# Patient Record
Sex: Female | Born: 1960 | Race: Black or African American | Hispanic: No | Marital: Married | Smoking: Never smoker
Health system: Southern US, Community
[De-identification: ages and names within clinical notes are randomized; demographics above are authoritative.]

## PROBLEM LIST (undated history)

## (undated) DIAGNOSIS — I10 Essential (primary) hypertension: Secondary | ICD-10-CM

## (undated) DIAGNOSIS — E079 Disorder of thyroid, unspecified: Secondary | ICD-10-CM

## (undated) HISTORY — PX: BREAST EXCISIONAL BIOPSY: SUR124

## (undated) HISTORY — PX: ABDOMINAL HYSTERECTOMY: SHX81

## (undated) HISTORY — DX: Essential (primary) hypertension: I10

## (undated) HISTORY — PX: THYROIDECTOMY, PARTIAL: SHX18

---

## 2001-01-14 ENCOUNTER — Emergency Department (HOSPITAL_COMMUNITY): Admission: EM | Admit: 2001-01-14 | Discharge: 2001-01-14 | Payer: Self-pay | Admitting: Internal Medicine

## 2001-01-14 ENCOUNTER — Encounter: Payer: Self-pay | Admitting: Internal Medicine

## 2001-01-14 ENCOUNTER — Ambulatory Visit (HOSPITAL_COMMUNITY): Admission: RE | Admit: 2001-01-14 | Discharge: 2001-01-14 | Payer: Self-pay

## 2001-09-28 ENCOUNTER — Other Ambulatory Visit: Admission: RE | Admit: 2001-09-28 | Discharge: 2001-09-28 | Payer: Self-pay | Admitting: Obstetrics and Gynecology

## 2002-04-05 ENCOUNTER — Ambulatory Visit (HOSPITAL_COMMUNITY): Admission: RE | Admit: 2002-04-05 | Discharge: 2002-04-05 | Payer: Self-pay | Admitting: Obstetrics & Gynecology

## 2003-01-13 ENCOUNTER — Encounter: Payer: Self-pay | Admitting: Preventative Medicine

## 2003-01-13 ENCOUNTER — Ambulatory Visit (HOSPITAL_COMMUNITY): Admission: RE | Admit: 2003-01-13 | Discharge: 2003-01-13 | Payer: Self-pay | Admitting: Preventative Medicine

## 2004-04-29 ENCOUNTER — Ambulatory Visit (HOSPITAL_COMMUNITY): Admission: RE | Admit: 2004-04-29 | Discharge: 2004-04-29 | Payer: Self-pay | Admitting: Obstetrics & Gynecology

## 2004-06-13 ENCOUNTER — Encounter: Admission: RE | Admit: 2004-06-13 | Discharge: 2004-09-11 | Payer: Self-pay | Admitting: Surgery

## 2005-01-13 ENCOUNTER — Encounter: Admission: RE | Admit: 2005-01-13 | Discharge: 2005-04-13 | Payer: Self-pay | Admitting: Surgery

## 2005-01-21 ENCOUNTER — Ambulatory Visit: Admission: RE | Admit: 2005-01-21 | Discharge: 2005-01-21 | Payer: Self-pay | Admitting: Surgery

## 2005-01-23 ENCOUNTER — Encounter: Admission: RE | Admit: 2005-01-23 | Discharge: 2005-01-23 | Payer: Self-pay | Admitting: *Deleted

## 2005-05-05 ENCOUNTER — Ambulatory Visit (HOSPITAL_COMMUNITY): Admission: RE | Admit: 2005-05-05 | Discharge: 2005-05-05 | Payer: Self-pay | Admitting: Obstetrics and Gynecology

## 2005-06-11 ENCOUNTER — Other Ambulatory Visit: Admission: RE | Admit: 2005-06-11 | Discharge: 2005-06-11 | Payer: Self-pay | Admitting: Obstetrics and Gynecology

## 2005-06-25 ENCOUNTER — Ambulatory Visit (HOSPITAL_COMMUNITY): Admission: RE | Admit: 2005-06-25 | Discharge: 2005-06-25 | Payer: Self-pay | Admitting: Obstetrics & Gynecology

## 2005-07-14 ENCOUNTER — Encounter: Payer: Self-pay | Admitting: Obstetrics and Gynecology

## 2005-07-14 ENCOUNTER — Observation Stay (HOSPITAL_COMMUNITY): Admission: RE | Admit: 2005-07-14 | Discharge: 2005-07-14 | Payer: Self-pay | Admitting: Obstetrics and Gynecology

## 2005-07-22 ENCOUNTER — Emergency Department (HOSPITAL_COMMUNITY): Admission: EM | Admit: 2005-07-22 | Discharge: 2005-07-22 | Payer: Self-pay | Admitting: Emergency Medicine

## 2005-07-24 ENCOUNTER — Inpatient Hospital Stay (HOSPITAL_COMMUNITY): Admission: RE | Admit: 2005-07-24 | Discharge: 2005-07-26 | Payer: Self-pay | Admitting: Obstetrics and Gynecology

## 2005-07-24 ENCOUNTER — Encounter: Payer: Self-pay | Admitting: Obstetrics and Gynecology

## 2005-08-22 ENCOUNTER — Ambulatory Visit (HOSPITAL_COMMUNITY): Admission: RE | Admit: 2005-08-22 | Discharge: 2005-08-22 | Payer: Self-pay | Admitting: Obstetrics and Gynecology

## 2006-01-02 ENCOUNTER — Ambulatory Visit: Payer: Self-pay | Admitting: Internal Medicine

## 2008-03-08 ENCOUNTER — Ambulatory Visit (HOSPITAL_COMMUNITY): Admission: RE | Admit: 2008-03-08 | Discharge: 2008-03-08 | Payer: Self-pay | Admitting: *Deleted

## 2009-03-14 ENCOUNTER — Ambulatory Visit (HOSPITAL_COMMUNITY): Admission: RE | Admit: 2009-03-14 | Discharge: 2009-03-14 | Payer: Self-pay | Admitting: *Deleted

## 2009-03-21 ENCOUNTER — Encounter (INDEPENDENT_AMBULATORY_CARE_PROVIDER_SITE_OTHER): Payer: Self-pay | Admitting: *Deleted

## 2009-03-27 DIAGNOSIS — R12 Heartburn: Secondary | ICD-10-CM | POA: Insufficient documentation

## 2009-03-27 DIAGNOSIS — A048 Other specified bacterial intestinal infections: Secondary | ICD-10-CM | POA: Insufficient documentation

## 2009-03-27 DIAGNOSIS — K219 Gastro-esophageal reflux disease without esophagitis: Secondary | ICD-10-CM | POA: Insufficient documentation

## 2009-03-28 ENCOUNTER — Ambulatory Visit: Payer: Self-pay | Admitting: Gastroenterology

## 2009-03-28 DIAGNOSIS — K3189 Other diseases of stomach and duodenum: Secondary | ICD-10-CM | POA: Insufficient documentation

## 2009-03-28 DIAGNOSIS — R10819 Abdominal tenderness, unspecified site: Secondary | ICD-10-CM | POA: Insufficient documentation

## 2009-03-28 DIAGNOSIS — R1013 Epigastric pain: Secondary | ICD-10-CM

## 2009-04-05 ENCOUNTER — Telehealth (INDEPENDENT_AMBULATORY_CARE_PROVIDER_SITE_OTHER): Payer: Self-pay

## 2009-04-06 ENCOUNTER — Encounter: Payer: Self-pay | Admitting: Gastroenterology

## 2009-04-06 ENCOUNTER — Ambulatory Visit (HOSPITAL_COMMUNITY): Admission: RE | Admit: 2009-04-06 | Discharge: 2009-04-06 | Payer: Self-pay | Admitting: Gastroenterology

## 2009-04-06 ENCOUNTER — Ambulatory Visit: Payer: Self-pay | Admitting: Gastroenterology

## 2009-04-06 ENCOUNTER — Telehealth: Payer: Self-pay | Admitting: Gastroenterology

## 2009-04-12 ENCOUNTER — Encounter (INDEPENDENT_AMBULATORY_CARE_PROVIDER_SITE_OTHER): Payer: Self-pay | Admitting: *Deleted

## 2009-09-11 ENCOUNTER — Encounter (INDEPENDENT_AMBULATORY_CARE_PROVIDER_SITE_OTHER): Payer: Self-pay | Admitting: *Deleted

## 2010-04-15 ENCOUNTER — Ambulatory Visit (HOSPITAL_COMMUNITY): Admission: RE | Admit: 2010-04-15 | Discharge: 2010-04-15 | Payer: Self-pay | Admitting: *Deleted

## 2010-05-03 ENCOUNTER — Encounter: Admission: RE | Admit: 2010-05-03 | Discharge: 2010-05-03 | Payer: Self-pay | Admitting: Family Medicine

## 2011-01-28 NOTE — Op Note (Signed)
NAMECACHET, MCCUTCHEN          ACCOUNT NO.:  [id number]   MEDICAL RECORD NO.:  [id number]          PATIENT TYPE:  AMB   LOCATION:  DAY                           FACILITY:  [facility]   PHYSICIAN:  Kassie Mends, M.D.      DATE OF BIRTH:  May 31, 1961   DATE OF PROCEDURE:  04/06/2009  DATE OF DISCHARGE:                                PROCEDURE NOTE   REFERRING Temika Sutphin:  Dr. Samuel Jester   PROCEDURE:  Esophagogastroduodenoscopy with cold forceps biopsy.   INDICATION FOR EXAM:  Ms. Mowers is a 50 year old female who  complains of abdominal pain.  She has a history of H. pylori serology  positivity, which is treated with antibiotics.  She continues to have  dyspepsia.  The colonoscopy was not performed today because the patient  could not tolerate the polyethylene glycol prep due to vomiting.   FINDINGS:  1. Normal esophagus without evidence of Barrett, mass, erosion,      ulceration or stricture.  2. Patchy erythema in the antrum without erosion or ulceration.      Biopsies obtained via cold forceps to evaluate for H. pylori      gastritis, eosinophilic gastritis.  3. Normal duodenal bulb and second portion of the duodenum.   RECOMMENDATIONS:  1. She should continue the Prevacid.  2. Hold aspirin, NSAIDs and anticoagulation for 7 days.  3. Low-fat diet.  She is given a handout on low-fat diet and the [facility] recommendations as well as gastritis.  4. Will call her with the results of her biopsies.  5. She will be scheduled for a colonoscopy with OsmoPrep in August      2010.  She is given the medication warning regarding phosphate      nephropathy.   MEDICATIONS:  1. Demerol 100 mg IV.  2. Versed 6 mg IV.   PROCEDURE TECHNIQUE:  Physical exam was performed.  Informed consent was  obtained from the patient after explaining the benefits, risks and  alternatives to the procedure.  The patient was connected to the monitor  and placed in left lateral position.   Continuous oxygen was provided by  nasal cannula and IV medicine administered through an indwelling  cannula.  After administration of sedation, the patient's esophagus was  intubated and the scope was advanced under direct visualization to the  second portion of the duodenum.  Scope was removed slowly by carefully  examining the color, texture, anatomy and integrity of the mucosa on the  way out.  The patient was recovered in endoscopy and discharged home in  satisfactory condition.   PATH:  No H. pylori.      Kassie Mends, M.D.  Electronically Signed     SM/MEDQ  D:  04/06/2009  T:  04/06/2009  Job:  [id number]   cc:   Samuel Jester  Fax: [phone]

## 2011-01-31 NOTE — Discharge Summary (Signed)
Bonnie Martin, Bonnie Martin          ACCOUNT NO.:  [id number]   MEDICAL RECORD NO.:  [id number]          PATIENT TYPE:  INP   LOCATION:  A428                          FACILITY:  [facility]   PHYSICIAN:  Tilda Burrow, M.D. DATE OF BIRTH:  1961/07/11   DATE OF ADMISSION:  DATE OF DISCHARGE:  LH                                 DISCHARGE SUMMARY   ADMISSION DIAGNOSES:  1.  Debilitating pelvic pain.  2.  Hematometra.  3.  Hydrosalpinx.   DISCHARGE DIAGNOSES:  1.  Pelvic pain.  2.  Hematometra.  3.  Bilateral hydrosalpinx.  4.  Obesity.   PROCEDURES:  Total abdominal hysterectomy and bilateral salpingo-  oophorectomy, panniculectomy.  [name] performed July 24, 2005.   DISCHARGE MEDICATIONS:  1.  Dilaudid 2 mg p.o. q.4 h p.r.n. pain.  2.  Phenergan 25 mg p.o. q.4 h p.r.n. nausea.  3.  Levaquin 500 mg p.o. daily x14 days.  4.  Levothyroxine 0.112 mg p.o. daily.  5.  Prevacid 30 mg p.o. daily.  6.  Toprol XL 100 mg p.o. daily.  7.  Simvastatin 10 mg q.h.s.  8.  K-Chlor 10 mEq p.o. b.i.d.  9.  Hyzera 100 mg p.o. daily.  10. Multivitamins over-the-counter once daily.  11. HCTZ 25 mg p.o. daily.   HOSPITAL SUMMARY:  This 50 year old female, status post endometrial ablation  was admitted for hysterectomy, possible bilateral salpingo-oophorectomy and  panniculectomy.  She was admitted on July 24, 2005.  She underwent  anticipate TAH with bilateral salpingo-oophorectomy and panniculectomy as  performed in the operative note.  The uterus showed bilateral hydrosalpinx  with communications in the proximal portion of the tube with the old blood  filled uterus.  There was evidence of uterine perforation during recent  efforts at hysteroscopy.  The patient's hysterectomy showed a 245 g uterus,  a 1900 g panniculus.  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  The  patient had 1500 cc blood loss and  required transfusion of two units of packed cells over the course of the  surgical procedure.   Postoperatively, the patient had surprisingly straightforward course.  Tolerating regular diet on day #2 and was discharged home on postop day #2  on Dilaudid, Phenergan and Levaquin.  For follow up in 5 days in our office  for staple removal and drain evaluation.  Postoperative hemoglobin was 12.2,  hematocrit 35.0 compared to 13.4 and 38.7 on admission.  Liver function  tests were normal.  Maternal blood type was 0 positive.      Tilda Burrow, M.D.  Electronically Signed     JVF/MEDQ  D:  08/11/2005  T:  08/12/2005  Job:  [id number]   cc:   Samuel Jester  Fax: [phone]

## 2011-01-31 NOTE — Op Note (Signed)
Bonnie Martin, Bonnie Martin          ACCOUNT NO.:  [id number]   MEDICAL RECORD NO.:  [id number]          PATIENT TYPE:  OBV   LOCATION:  A417                          FACILITY:  [facility]   PHYSICIAN:  Tilda Burrow, M.D. DATE OF BIRTH:  February 21, 1961   DATE OF PROCEDURE:  07/14/2005  DATE OF DISCHARGE:                                 OPERATIVE REPORT   PREOPERATIVE DIAGNOSIS:  Pelvic pain, hematometria.   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   OPERATION PERFORMED:  Hysteroscopy, discontinued dilation and curettage.   SURGEON:  Tilda Burrow, M.D.   ASSISTANT:  None.   ANESTHESIA:  Spinal.  Nelda Severe CRNA.   COMPLICATIONS:  Technically challenging procedure, notable for possible  uterine perforation during placement of the curette, resulting in  discontinuation of the dilation and curettage.   DESCRIPTION OF PROCEDURE:  The patient was taken to the operating room,  spinal anesthesia introduced and the patient placed in supine position.  Cervix was identified and showed descensus to within approximately 3 cm of  the introitus.  The anterior lip of the cervix was significantly longer than  the posterior lip.  The uterus was sounded to a depth of 8 cm from the  posterior lip, 10 cm from the anterior lip, found to be dilated just  slightly to the right of the midline.  This was carefully achieved and had  the sensation of a normal sounding.  There was an expulsion of a bit of dark  blood with the sounding process indicating that some endometrial cavity  debris was released.  We then dilated the lower uterine segment serially to  68 [place] with normal sensation.  At this point, the rigid hysteroscope, 12  degree angulation was then used to attempt to sound the canal and enter the  uterine cavity.  There was initially some resistance and efforts at gently  teasing and rotating back and forth with the scope in  order to enter the  uterine cavity were unsuccessful. We took a photograph of the lower uterine  segment through the hysteroscope.  Once we got up into the lower uterine  segment, the area immediately in front of the camera showed thin irregular  areas of suspected uterine synechiae, all thin and filmy.  I felt that this  perhaps represented postablation adhesions.   Of particular note is that the patient had significant amount of bleeding  over the weekend.  There was not a lot of blood encountered in the uterine  cavity other than some fresh blood from the uterine fundus. We were able to  observe the area but could not identify the tubal ostia.  The thin filmy  adhesions precluded a thorough evaluation of the endometrial cavity.  We  never were able to get the hysteroscope all the way to the fundus due to  some bright red fresh bleeding.   At this point, we felt there had been no evidence to suggest perforation  though the bright red oozing from the upper aspect of the uterus above the  filmy adhesions raised questions.  A smooth sharp  curette was then  introduced gently but met less resistance than expected, and after having a  brief initial sensation of contacting the uterine fundus at the appropriate  depth of approximately 8 cm, the curette passed 2 to 3 cm past that,  suggesting possibility of uterine perforation or false channel production.  The curette was gently eased out.  There was no actual curettage performed.  We sent the small amount of tissue on the curetted edge as a sample.  We  then attempted to repeat hysteroscopy.  At this time the hysteroscope  reintroduced into the level of the thin filmy adhesions.  We could not  really look past them to identify if there was any distinct evaluation of  perforation.  We discontinued the curettage and at this point monitored her  for little greater than usual oozing per vagina.  We will observe the  patient overnight to ensure  that there is no evidence of perforation  complications.  At the time of the recovery room, the patient had minimal  bleeding and was completely comfortable.  We will monitor her at least until  5 o'clock, perhaps overnight.  CBC is ordered at 4 p.m.      Tilda Burrow, M.D.  Electronically Signed     JVF/MEDQ  D:  07/14/2005  T:  07/14/2005  Job:  [id number]

## 2011-01-31 NOTE — H&P (Signed)
NAME:  Bonnie Martin, Bonnie Martin          ACCOUNT NO.:  [id number]   MEDICAL RECORD NO.:  [id number]          PATIENT TYPE:  AMB   LOCATION:  DAY                           FACILITY:  [facility]   PHYSICIAN:  Tilda Burrow, M.D. DATE OF BIRTH:  1961-08-27   DATE OF ADMISSION:  DATE OF DISCHARGE:  LH                                HISTORY & PHYSICAL   ADMISSION DIAGNOSES:  Progressively debilitating pelvic pain secondary to  hematometra, failed hysteroscopy and left hydrosalpinx, admitted for total  abdominal hysterectomy, left salpingectomy, possible bilateral  salpingectomy, and panniculectomy.   HISTORY OF PRESENT ILLNESS:  This 50 year old female is admitted at this  time for hysterectomy after chronic and progressive pelvic pain associated  with hematometra.  Bonnie Martin had a hysteroscopy D&C and endometrial ablation 3  years ago for progressive heavy menses.  She did well for a while and over  past few months developed increasing discomfort in the pelvis.  She had  significant left lower quadrant pain evaluated by ultrasound in early  October which showed a retroverted uterus with hematometra.  She had a left  hydrosalpinx with fluid noted there as well.  Hematometra measured 2.3 cm in  diameter at the time.  The hydrosalpinx did not give distinct measurements  initially.  She has had no fever.  GC and chlamydia cultures were negative.  She had attempts at hysteroscopic drainage of the hematometra performed by  [name] on July 14, 2005.  This was discontinued after we could  not be certain if there was a uterine perforation associated with initial  placement of curet.  She was observed overnight to ensure stability and was  indeed without any change in status overnight.  The limited amount of tissue  obtained showed only endocervical mucosa with no evidence of endometrial  tissue.  She was then sent home for followup in our office.  She has had  increasing progressive pain without  fever or chills.  She has been seen in  the emergency room at [facility] with normal white count, unchanged  hematocrit, and an ultrasound which shows increased size to the fluid pocket  and the hydrosalpinx still present.  She was seen back in our office the  following morning for discussion of options, and we decided to proceed with  hysterectomy.  Pros and cons of surgery have been discussed with her,  emphasizing that efforts at drainage of the uterus would not likely result  in long-term cure.  The pain was significantly debilitating that she was  scheduled for surgery originally for July 23, 2005, and rescheduled to  July 24, 2005, at patient request.  Unfortunately yesterday, July 22, 2005, she was seen in the emergency room due to increasing pain, not  resolved with Percocet, and so she was given Dilaudid and Phenergan IM.  She  was afebrile and simply experiencing increasing discomfort.  She at this  point has had bowel prep and is scheduled for surgery.   Interestingly, her blood pressure has been less than optimally controlled on  her last two assessments.  She had blood pressure 172/100 when seen  in the  office and similarly elevated in the emergency room but improved with  intramuscular narcotics for pain.   PAST MEDICAL HISTORY:  Positive for myocardial infarction years ago managed  by Dr. Shelva Majestic and Dr. Samuel Jester.  She has history of menorrhagia.  She  has hyperthyroidism due to Graves disease, treated with partial  thyroidectomy in 2005 and which was currently treated with Synthroid.   PAST SURGICAL HISTORY:  1.  Partial thyroidectomy in 2005.  2.  _Endometrial Ablation_ 2003.  3.  Tubal ligation 1993 with second child.   ALLERGIES:  SULF DRUGS.   PHYSICAL EXAMINATION:  GENERAL:  Markedly obese African-American female,  alert and oriented x3.  Weight 279.  Blood pressure 170/100 at last check,  as low as 132/82 at recent office visits.  HEENT:  Normocephalic and atraumatic.  NECK:  Supple. Trachea midline.  CHEST: Clear to auscultation.  ABDOMEN:  Abdominal thickness.  Well-healed C section scar obscured beneath  a large jelly roll panniculus.  We discussed access to the pelvis, and plan  to do a partial panniculectomy to improve access and post surgical mobility  and flexion capability. The patient understands enlarged incision as  accompanying risks associated with this and is an uncovered service by  insurance.  The area of planned surgical incision has been sketched out on  the patient during this discussion for her evaluation and consideration.  PELVIC:  External genitalia normal.  Vaginal exam shows normal secretions.  Cervix closed, nonpurulent.  Recent GC and chlamydia cultures were negative.  Pap smears are up to date and normal.  Adnexa are tender with retroverted  uterus deviated to the left.  She complains of light ache in the inner thigh  which does not seem related to back or leg but to pelvic pressure symptoms.   Ultrasound shows enlarging hematometra and left hydrosalpinx.   PLAN:  Total abdominal hysterectomy, left salpingectomy, possible bilateral  salpingo-oophorectomy with panniculectomy on July 24, 2005.      Tilda Burrow, M.D.  Electronically Signed     JVF/MEDQ  D:  07/23/2005  T:  07/23/2005  Job:  [id number]   cc:   Samuel Jester, M.D.   Macarthur Critchley Shelva Majestic, M.D.  Fax: [phone]

## 2011-01-31 NOTE — Op Note (Signed)
Bonnie Martin, Bonnie Martin          ACCOUNT NO.:  [id number]   MEDICAL RECORD NO.:  [id number]          PATIENT TYPE:  AMB   LOCATION:  DAY                           FACILITY:  [facility]   PHYSICIAN:  Tilda Burrow, M.D. DATE OF BIRTH:  [birth year]   DATE OF PROCEDURE:  07/24/2005  DATE OF DISCHARGE:                                 OPERATIVE REPORT   PREOPERATIVE DIAGNOSES:  1.  Hematometra.  2.  Left hydrosalpinx.  3.  Pelvic pain.  4.  Obesity with panniculus.   POSTOPERATIVE DIAGNOSES:  1.  Pyometra versus hematometria  2.  Bilateral hydrosalpinx.  3.  Pelvic adhesions.  4.  Obesity.   PROCEDURE:  Total abdominal hysterectomy, bilateral salpingo-oophorectomy,  panniculectomy.   SURGEON:  Tilda Burrow, M.D.   ASSISTANT:  ____________, C.S.T.   ANESTHESIA:  General.   COMPLICATIONS:  Diffuse oozing from uterus, extensive adhesions in the  abdomen and pelvis both to the anterior abdominal wall from the prior  Cesarean section and to the pelvis, possibly long standing with additional  adhesions suspected after the uterine perforation the other day.   DETAILS OF PROCEDURE:  The patient was taken to the operating room, prepped  and draped for abdominal and vaginal procedure. The previously marked  panniculectomy, measuring approximately 80 cm in total length, was excised  with maximum width of approximately 12 cm. The underlying fatty tissue was  taken down, being careful to leave a layer of fat over the underlying  fascia. This was quite challenging with extensive vascular supply requiring  multiple ligatures around arterials. We will careful with hemostasis  throughout the case. The lateral one third on each side was closed  initially, leaving only the part between the anterior superior iliac crest  on each side to the midline open. We proceeded with the hysterectomy. The  hysterectomy consisted of a midline vertical Pelosi type incision with  careful entry to the  peritoneal cavity, elevation of the peritoneum and  opening from symphysis pubis to just below the umbilicus. There was  extensive adhesions to the anterior abdominal wall from the omentum which  was densely adherent with apparent long-standing adhesions. This was  dissected off with some difficulty. There were tips of omental adhesions  that went all the day down to the anterior surface of the uterus. They also  went down to the top of the left hydrosalpinx which was suspected due to its  dark character to represent pyosalpinx. The pressure in it was suggestive of  pyosalpinx. We were able to eventually free up the omental adhesions  sufficiently to pack bowel away and begin the hysterectomy. Hysterectomy  consisted of grasping the uterus with a Lahey thyroid tenaculum, taking down  the round ligaments on both sides. I inspected the adnexa, and after it was  apparent that there was extensive adhesions particularly on the left but  involving both sides, we felt that efforts of salvage of ovaries would only  result in need for recurrent surgery. We therefore made the clinical  decision as guided by the patient's preoperative instructions to proceed  with removal of both tubes and  ovaries. First, the right infundibulopelvic  ligament was isolated. The ureter palpable isolated out of the surgical  field, the IP ligament then clamped, cut, and suture ligated. This was  doubly ligated. The broad ligament was skeletonized down toward the uterine  vessels. Bladder flap was developed anteriorly. The left side was treated  similarly. We then cross clamped the uterine vessels with two Heaney clamps,  and back bleeding was controlled with Kelly clamp. We doubly ligated the  uterine vessels. We then marched down the upper and lower cardinal  ligaments, serially clamping, cutting, and suture ligating. There was pretty  generous oozing as we did dissection. This became particularly apparent when  we  started to amputate the cervix off of the vaginal cuff. Very thickened  cuff was amputated off with some technically difficulty, and huge amount of  vascularity encountered, particularly behind the uterine vessels on either  side. This required several figure-of-eight sutures, being careful to place  first an Aldrich stitch at the lateral vaginal angle, then two figure-of-  eight stitches over it even before we were able to completely amputate the  cervix off the cuff. The amputation was then completed. The rest of the cuff  closed with interrupted sutures with a second layer of running locking 0  chromic necessary to improve hemostasis. Pelvic support appeared good.  Pelvis could be irrigated at this point, inspected, found adequately  hemostatic. Peritoneum was only loosely reapproximated in the bladder flap  area. The lateral areas were not reapproximated. Ureters were reconfirmed as  having been unaffected by the surgery visually. We then proceeded to  irrigate the abdomen, removed all laparotomy equipment, inspected and found  no evidence of abdominal injury or contamination. The anterior peritoneum  was closed with 2-0 chromic, the fascia closed in a running 0 Prolene  fashion, then the subcu tissues continued in the reapproximation in the  midline. We reapproximated the subcu tissues with 2-0 plane interrupted  sutures resulting in good skin edge approximation. Two flat JP drains were  placed in the lateral aspects of the incision and allowed to exit through  stab wounds inferior to the incision in the mons pubis area. These were  placed to suction low pressure drainage. The skin edges were then  approximated using staple closure, two staples worth of staple closure were  necessary. As stated earlier, the lateral half lateral half had been stapled  closed during the initial portions of the procedure. Estimated blood loss was 1,400 cc due to the huge amount of vascularity encountered  both in the  subcu fatty tissues and particularly in the pelvis on the back edge of the  vagina. The patient tolerated the procedure well, received 2 units of packed  cells, went to the recovery room with blood pressures in the 140s/90s  initially. She will be treated with Apresoline as necessary for the initial  acute blood pressure management.      Tilda Burrow, M.D.  Electronically Signed     JVF/MEDQ  D:  07/24/2005  T:  07/24/2005  Job:  [id number]   cc:   Samuel Jester  Fax: [phone]   Macarthur Critchley. Shelva Majestic, M.D.  Fax: [phone]

## 2011-01-31 NOTE — Op Note (Signed)
[facility]  Patient:    Bonnie Martin, Bonnie Martin Visit Number: [id number] MRN: [id number]          Service Type: DSU Location: DAY Attending Physician:  Lazaro Arms Dictated by:   Duane Lope, M.D. Proc. Date: 04/05/02 Admit Date:  04/05/2002 Discharge Date: 04/05/2002                             Operative Report  PREOPERATIVE DIAGNOSES: 1. Dysfunctional bleeding. 2. Anemia, unresponsive to conservative measures.  POSTOPERATIVE DIAGNOSES: 1. Dysfunctional bleeding.2 2. Anemia, unresponsive to conservative measures. 3. Large amount of endometrial tissue.  OPERATION: Diagnostic hysteroscopy, uterine curettage, and endometrial ablation using ThermaChoice catheter.  SURGEON:  Duane Lope, M.D.  ANESTHESIA:  General endotracheal.  FINDINGS:  The patient had basically normal uterus and she had basically normal looking endometrial tissue, it was just a lot of it.  There were no polyps or fibroids seen.  DESCRIPTION OF PROCEDURE:  The patient was taken to the operating room, placed in the supine position and underwent general endotracheal anesthesia.  She was then placed in the dorsal lithotomy position, prepped and draped in the usual sterile fashion.  Bladder was drained.  Speculum was placed. Cervix was grasped.  The uterus sounded to 9 cm.  The cervix was dilated to allow passage of a 5 mm hysteroscope and the endometrial cavity was viewed and found to be normal, just a lot of endometrial tissue, no polyps, no fibroids, and the tissue was not concerning for carcinoma although I could believe it could possibly be a endometrial hyperplasia.  The cervix was then dilated and a vigorous sharp curettage was performed and got good uterine cry in all areas. We then reviewed it with the hysteroscope and it effectively gave [place] shaggy thin endometrium at that point.  ThermaChoice endometrial ablation catheter was then used.  It was taken to a pressure of 175  mmHg and remained stable. It required 16 cc of D5W, a 1 minute 16 second warm up and then 8 minutes of ablation was performed at 87 degrees Celsius or 188 degrees Farenheit.  All 16 cc were removed from the catheter.  It was found to be intact.  The patient tolerated the procedure well.  She experienced minimal blood loss and was taken to the recovery room in good and stable condition.  All counts were correct.    ESTIMATED BLOOD LOSS:  INDICATIONS:  DESCRIPTION OF PROCEDURE: Dictated by:   Duane Lope, M.D. Attending Physician:  Lazaro Arms DD:  04/05/02 TD:  04/09/02 Job: [id number]

## 2011-01-31 NOTE — Consult Note (Signed)
[name], [name]          ACCOUNT NO.:  [id number]   MEDICAL RECORD NO.:  [id number]          PATIENT TYPE:  AMB   LOCATION:                                FACILITY:  [facility]   PHYSICIAN:  Lionel December, M.D.    DATE OF BIRTH:  [birth year]   DATE OF CONSULTATION:  01/02/2006  DATE OF DISCHARGE:                                   CONSULTATION   REASON FOR CONSULTATION:  Gastroesophageal reflux disease, history of H.  pylori, needs EGD.   REFERRING PHYSICIAN:  Samuel Jester, M.D.   HISTORY OF PRESENT ILLNESS:  Bonnie Martin is a 50 year old African-American female  patient of Dr. Samuel Jester who presents for further evaluation of above-  stated symptoms.  She says she has had heartburn symptoms off and on for the  past one year.  She also complains of postprandial epigastric burning type  pain.  She has a sensation of a lump and fizzing in her throat when she  wakes up in the mornings.  She has been off and on Prevacid for the past  year.  She does not take it for an extensive period of time, however.  She  says she has had at least three H. pylori breath tests with the third one  being negative.  She has been treated for H. pylori at least 2-3 times.  She  has never had an EGD.  Bowel movements are regular.  No melena or rectal  bleeding.  She has early a.m. nausea.  She denies any dysphagia or  odynophagia.   CURRENT MEDICATIONS:  1.  Toprol XL 100 mg daily.  2.  Simvastatin 40 mg daily.  3.  Potassium chloride 20 mEq daily.  4.  Hydrochlorothiazide 25 mg daily.  5.  Hyzaar 100/25 mg daily.  6.  Levothyroxine 112 mcg daily.  7.  Multivitamin daily.  8.  Aspirin 325 mg daily.  9.  Tums extra strength daily.  10. Biotin 25 mg daily.   ALLERGIES:  SULFA.   PAST MEDICAL HISTORY:  1.  Hypertension.  2.  History of MI at age 35, status post catheterization without angioplasty      or stenting.  3.  Gastroesophageal reflux disease.  4.  Chronic low back pain.  5.   Hypercholesterolemia.  6.  Hypothyroidism.   PAST SURGICAL HISTORY:  1.  D&C.  2.  Cesarean section.  3.  Hysterectomy in November of 2006 also with panniculectomy complicated by      wound infections.   FAMILY HISTORY:  Father had prostate cancer.  No family history of  colorectal cancer.   SOCIAL HISTORY:  She is married with two children.  She is a IT trainer, Water engineer.  She has never been a smoker, no  alcohol use.   REVIEW OF SYSTEMS:  See HPI for GI.  CONSTITUTIONAL:  No weight loss.  CARDIOPULMONARY:  No chest pain or shortness of breath.   PHYSICAL EXAMINATION:  VITAL SIGNS:  Weight 279.5, height 5 feet  4 inches,  temperature 97.8, blood pressure 132/80, pulse 60.  GENERAL:  Pleasant, morbidly obese,  black female in no acute distress.  SKIN:  Warm and dry, no jaundice.  HEENT:  Conjunctivae are  pink, sclerae nonicteric.  Oropharyngeal mucosa  moist and pink.  No lesions, erythema, or exudate.  NECK:  No lymphadenopathy or thyromegaly.  CHEST:  Lungs are clear to auscultation.  CARDIAC:  Exam reveals regular rate and rhythm.  Normal S1/S2.  No murmurs,  rubs, or gallops.  ABDOMEN:  Positive bowel sounds.  Obese but symmetrical.  Soft.  She has  minimal tenderness along her lower abdominal incision.  No rebound  tenderness or guarding.  Mild epigastric tenderness to deep palpation.  No  organomegaly or masses.  EXTREMITIES:  No edema.   IMPRESSION:  Bonnie Martin is a 50 year old lady with intermittent gastroesophageal  reflux symptoms, postprandial epigastric burning type pain over the last one  year.  Symptoms seem to respond to Prevacid; however, she does not stay on  the medication more than a few weeks at a time.  She gives a history of  being treated multiple times for H. pylori, and she believes her last H.  pylori breath test was negative, but I do not have records of this.   PLAN:  1.  Agree with upper endoscopy for further evaluation  of epigastric pain and      gastroesophageal reflux disease symptoms.  She will hold her aspirin for      four days prior to procedure.  Will have her resume Prevacid 30 mg      daily, #20, Solu-tabs provided.  2.  Antireflux measures.  3.  Further recommendations to follow.      Tana Coast, P.A.      Lionel December, M.D.  Electronically Signed    LL/MEDQ  D:  01/02/2006  T:  01/04/2006  Job:  [id number]   cc:   Samuel Jester  Fax: [phone]

## 2011-06-17 ENCOUNTER — Other Ambulatory Visit (HOSPITAL_COMMUNITY): Payer: Self-pay | Admitting: *Deleted

## 2011-06-17 DIAGNOSIS — Z139 Encounter for screening, unspecified: Secondary | ICD-10-CM

## 2011-06-19 ENCOUNTER — Ambulatory Visit (HOSPITAL_COMMUNITY)
Admission: RE | Admit: 2011-06-19 | Discharge: 2011-06-19 | Disposition: A | Discharge status: Home / Self Care | Payer: BC Managed Care – PPO | Source: Ambulatory Visit | Attending: *Deleted | Admitting: *Deleted

## 2011-06-19 ENCOUNTER — Ambulatory Visit (HOSPITAL_COMMUNITY): Payer: Self-pay

## 2011-06-19 DIAGNOSIS — Z139 Encounter for screening, unspecified: Secondary | ICD-10-CM

## 2011-06-19 DIAGNOSIS — Z1231 Encounter for screening mammogram for malignant neoplasm of breast: Secondary | ICD-10-CM | POA: Insufficient documentation

## 2013-03-24 ENCOUNTER — Other Ambulatory Visit (HOSPITAL_COMMUNITY): Payer: Self-pay | Admitting: *Deleted

## 2013-03-24 DIAGNOSIS — Z139 Encounter for screening, unspecified: Secondary | ICD-10-CM

## 2013-03-25 ENCOUNTER — Ambulatory Visit (HOSPITAL_COMMUNITY)
Admission: RE | Admit: 2013-03-25 | Discharge: 2013-03-25 | Disposition: A | Discharge status: Home / Self Care | Payer: BC Managed Care – PPO | Source: Ambulatory Visit | Attending: *Deleted | Admitting: *Deleted

## 2013-03-25 DIAGNOSIS — Z139 Encounter for screening, unspecified: Secondary | ICD-10-CM

## 2013-03-25 DIAGNOSIS — Z1231 Encounter for screening mammogram for malignant neoplasm of breast: Secondary | ICD-10-CM | POA: Insufficient documentation

## 2014-01-31 ENCOUNTER — Ambulatory Visit (INDEPENDENT_AMBULATORY_CARE_PROVIDER_SITE_OTHER): Payer: BC Managed Care – PPO | Admitting: Orthopedic Surgery

## 2014-01-31 ENCOUNTER — Encounter: Payer: Self-pay | Admitting: Orthopedic Surgery

## 2014-01-31 VITALS — BP 179/94 | Ht 64.0 in | Wt 290.0 lb

## 2014-01-31 DIAGNOSIS — IMO0002 Reserved for concepts with insufficient information to code with codable children: Secondary | ICD-10-CM

## 2014-01-31 DIAGNOSIS — M1710 Unilateral primary osteoarthritis, unspecified knee: Secondary | ICD-10-CM

## 2014-01-31 DIAGNOSIS — M170 Bilateral primary osteoarthritis of knee: Secondary | ICD-10-CM | POA: Insufficient documentation

## 2014-01-31 MED ORDER — DICLOFENAC POTASSIUM 50 MG PO TABS: 50.0000 mg | ORAL_TABLET | Freq: Two times a day (BID) | ORAL | Status: DC

## 2014-01-31 MED ORDER — TRAMADOL HCL 50 MG PO TABS: 50.0000 mg | ORAL_TABLET | Freq: Four times a day (QID) | ORAL | Status: DC | PRN

## 2014-01-31 NOTE — Patient Instructions (Addendum)
Arthritis both knees  You have received a steroid shot. 15% of patients experience increased pain at the injection site with in the next 24 hours. This is best treated with ice and tylenol extra strength 2 tabs every 8 hours. If you are still having pain please call the office.   Meds ordered this encounter  Medications  . diclofenac (CATAFLAM) 50 MG tablet    Sig: Take 1 tablet (50 mg total) by mouth 2 (two) times daily.    Dispense:  90 tablet    Refill:  3  . traMADol (ULTRAM) 50 MG tablet    Sig: Take 1 tablet (50 mg total) by mouth every 6 (six) hours as needed.    Dispense:  60 tablet    Refill:  5    Osteoarthritis Osteoarthritis is a disease that causes soreness and swelling (inflammation) of a joint. It occurs when the cartilage at the affected joint wears down. Cartilage acts as a cushion, covering the ends of bones where they meet to form a joint. Osteoarthritis is the most common form of arthritis. It often occurs in older people. The joints affected most often by this condition include those in the:  Ends of the fingers.  Thumbs.  Neck.  Lower back.  Knees.  Hips. CAUSES  Over time, the cartilage that covers the ends of bones begins to wear away. This causes bone to rub on bone, producing pain and stiffness in the affected joints.  RISK FACTORS Certain factors can increase your chances of having osteoarthritis, including:  Older age.  Excessive body weight.  Overuse of joints. SIGNS AND SYMPTOMS   Pain, swelling, and stiffness in the joint.  Over time, the joint may lose its normal shape.  Small deposits of bone (osteophytes) may grow on the edges of the joint.  Bits of bone or cartilage can break off and float inside the joint space. This may cause more pain and damage. DIAGNOSIS  Your health care provider will do a physical exam and ask about your symptoms. Various tests may be ordered, such as:  X-rays of the affected joint.  An MRI scan.  Blood  tests to rule out other types of arthritis.  Joint fluid tests. This involves using a needle to draw fluid from the joint and examining the fluid under a microscope. TREATMENT  Goals of treatment are to control pain and improve joint function. Treatment plans may include:  A prescribed exercise program that allows for rest and joint relief.  A weight control plan.  Pain relief techniques, such as:  Properly applied heat and cold.  Electric pulses delivered to nerve endings under the skin (transcutaneous electrical nerve stimulation, TENS).  Massage.  Certain nutritional supplements.  Medicines to control pain, such as:  Acetaminophen.  Nonsteroidal anti-inflammatory drugs (NSAIDs), such as naproxen.  Narcotic or central-acting agents, such as tramadol.  Corticosteroids. These can be given orally or as an injection.  Surgery to reposition the bones and relieve pain (osteotomy) or to remove loose pieces of bone and cartilage. Joint replacement may be needed in advanced states of osteoarthritis. HOME CARE INSTRUCTIONS   Only take over-the-counter or prescription medicines as directed by your health care provider. Take all medicines exactly as instructed.  Maintain a healthy weight. Follow your health care provider's instructions for weight control. This may include dietary instructions.  Exercise as directed. Your health care provider can recommend specific types of exercise. These may include:  Strengthening exercises These are done to strengthen the  muscles that support joints affected by arthritis. They can be performed with weights or with exercise bands to add resistance.  Aerobic activities These are exercises, such as brisk walking or low-impact aerobics, that get your heart pumping.  Range-of-motion activities These keep your joints limber.  Balance and agility exercises These help you maintain daily living skills.  Rest your affected joints as directed by your  health care provider.  Follow up with your health care provider as directed. SEEK MEDICAL CARE IF:   Your skin turns red.  You develop a rash in addition to your joint pain.  You have worsening joint pain. SEEK IMMEDIATE MEDICAL CARE IF:  You have a significant loss of weight or appetite.  You have a fever along with joint or muscle aches.  You have night sweats. FOR MORE INFORMATION  National Institute of Arthritis and Musculoskeletal and Skin Diseases: www.niams.http://www.myers.net/nih.gov [employer]: https://walker.com/www.nia.nih.gov American College of Rheumatology: www.rheumatology.org Document Released: 09/01/2005 Document Revised: 06/22/2013 Document Reviewed: 05/09/2013 [facility] Patient Information 2014 [place], [place].

## 2014-01-31 NOTE — Progress Notes (Signed)
Patient ID: Bonnie SilversmithMarsha F Martin, female   DOB: 1961/06/29, 53 y.o.   MRN: [id number]  BP 179/94  Ht 5\' 4"  (1.626 m)  Wt 290 lb (131.543 kg)  BMI 49.75 kg/m2   Knee pain right worse than left  53 year old female many years of pain in her right knee started 2 years ago lateral symptoms now. Complains of pain swelling locking stiffness giving way. Previous treatment over-the-counter Aleve and some intramuscular cortisone injections no relief pain got worse thought primary care physician who referred to us for further management.  She lists allergies to sulfa and adhesive tape family history of heart disease hypertension heart attack  Mother and father both deceased died of kidney disease and heart disease  Review of systems negative except limb pain swelling of the joint related to her knees she does wear glasses has some psoriasis and thyroid disorder. Medical history gastric reflux hypertension heart attack  Was scheduled for gastric bypass but that was canceled due a premonition of the patient  Cesarean section noted thyroid gland removed and hysterectomy  Medications are estrogen with testosterone hydrochlorothiazide levothyroxine pravastatin losartan metoprolol ankle Thayer Ohmhris for treatment of gout  Right knee x-ray was done shows arthritis severe with varus alignment   Physical findings Vitals are stable as above General appearance is normal, the patient is alert and oriented x3 with normal mood and affect. She ambulates with a waddling gait Upper extremity exam  The right and left upper extremity:   Inspection and palpation revealed no abnormalities in the upper extremities.   Range of motion is full without contracture.  Motor exam is normal with grade 5 strength.  The joints are fully reduced without subluxation.  There is no atrophy or tremor and muscle tone is normal.  All joints are stable.  We do notice that the left knee didn't swell 115 limited by the size and  shape. Joint line tenderness noted. Stability and strength normal. Skin normal pulse normal no lymph nodes sensation normal no pathologic reflexes  Balance normal  Right knee a little bit more difficulty with bending more joint line tenderness no effusion stability strength normal skin intact pulses and temperature normal normal lymph nodes normal sensation no pathologic reflexes  X-ray right knee show severe arthritis osteophytes end-stage disease varus deformity  However the patient's basic metabolic index is 49 I would like to be under 40. Recommend aggressive weight loss  Start medical management  Inject both knees  Knee  Injection Procedure Note  Pre-operative Diagnosis: left knee oa  Post-operative Diagnosis: same  Indications: pain  Anesthesia: ethyl chloride   Procedure Details   Verbal consent was obtained for the procedure. Time out was completed.The joint was prepped with alcohol, followed by  Ethyl chloride spray and A 20 gauge needle was inserted into the knee via lateral approach; 4ml 1% lidocaine and 1 ml of depomedrol  was then injected into the joint . The needle was removed and the area cleansed and dressed.  Complications:  None; patient tolerated the procedure well. 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 Time out was completed.The joint was prepped with alcohol, followed by  Ethyl chloride spray and A 20 gauge needle was inserted into the knee via lateral approach; 4ml 1% lidocaine and 1 ml of depomedrol  was then injected  into the joint . The needle was removed and the area cleansed and dressed.  Complications:  None; patient tolerated the procedure well.  Meds ordered this encounter  Medications  . diclofenac (CATAFLAM) 50 MG tablet     Sig: Take 1 tablet (50 mg total) by mouth 2 (two) times daily.    Dispense:  90 tablet    Refill:  3  . traMADol (ULTRAM) 50 MG tablet    Sig: Take 1 tablet (50 mg total) by mouth every 6 (six) hours as needed.    Dispense:  60 tablet    Refill:  5

## 2014-03-27 ENCOUNTER — Other Ambulatory Visit: Payer: Self-pay

## 2014-03-27 DIAGNOSIS — Z1231 Encounter for screening mammogram for malignant neoplasm of breast: Secondary | ICD-10-CM

## 2014-04-06 ENCOUNTER — Ambulatory Visit
Admission: RE | Admit: 2014-04-06 | Discharge: 2014-04-06 | Disposition: A | Discharge status: Home / Self Care | Payer: BC Managed Care – PPO | Source: Ambulatory Visit

## 2014-04-06 DIAGNOSIS — Z1231 Encounter for screening mammogram for malignant neoplasm of breast: Secondary | ICD-10-CM

## 2014-06-30 ENCOUNTER — Other Ambulatory Visit: Payer: Self-pay

## 2015-03-21 ENCOUNTER — Other Ambulatory Visit: Payer: Self-pay

## 2015-03-21 DIAGNOSIS — Z1231 Encounter for screening mammogram for malignant neoplasm of breast: Secondary | ICD-10-CM

## 2015-04-25 ENCOUNTER — Ambulatory Visit
Admission: RE | Admit: 2015-04-25 | Discharge: 2015-04-25 | Disposition: A | Discharge status: Home / Self Care | Payer: BC Managed Care – PPO | Source: Ambulatory Visit

## 2015-04-25 DIAGNOSIS — Z1231 Encounter for screening mammogram for malignant neoplasm of breast: Secondary | ICD-10-CM

## 2016-04-04 ENCOUNTER — Other Ambulatory Visit: Payer: Self-pay | Admitting: *Deleted

## 2016-04-04 DIAGNOSIS — Z1231 Encounter for screening mammogram for malignant neoplasm of breast: Secondary | ICD-10-CM

## 2016-04-28 ENCOUNTER — Ambulatory Visit
Admission: RE | Admit: 2016-04-28 | Discharge: 2016-04-28 | Disposition: A | Discharge status: Home / Self Care | Payer: BC Managed Care – PPO | Source: Ambulatory Visit | Attending: *Deleted | Admitting: *Deleted

## 2016-04-28 DIAGNOSIS — Z1231 Encounter for screening mammogram for malignant neoplasm of breast: Secondary | ICD-10-CM

## 2016-05-07 ENCOUNTER — Ambulatory Visit: Payer: BC Managed Care – PPO | Admitting: Orthopedic Surgery

## 2016-05-08 ENCOUNTER — Encounter: Payer: Self-pay | Admitting: Orthopedic Surgery

## 2016-05-21 DIAGNOSIS — M1A9XX0 Chronic gout, unspecified, without tophus (tophi): Secondary | ICD-10-CM | POA: Insufficient documentation

## 2016-05-21 DIAGNOSIS — I252 Old myocardial infarction: Secondary | ICD-10-CM | POA: Insufficient documentation

## 2016-05-21 DIAGNOSIS — I10 Essential (primary) hypertension: Secondary | ICD-10-CM | POA: Insufficient documentation

## 2016-11-03 ENCOUNTER — Encounter: Payer: Self-pay | Admitting: Pediatrics

## 2016-11-03 ENCOUNTER — Ambulatory Visit (INDEPENDENT_AMBULATORY_CARE_PROVIDER_SITE_OTHER): Payer: Worker's Compensation | Admitting: Pediatrics

## 2016-11-03 ENCOUNTER — Ambulatory Visit (INDEPENDENT_AMBULATORY_CARE_PROVIDER_SITE_OTHER): Payer: Worker's Compensation

## 2016-11-03 VITALS — BP 128/75 | HR 72 | Temp 98.3°F | Ht 64.0 in | Wt 289.0 lb

## 2016-11-03 DIAGNOSIS — M25561 Pain in right knee: Secondary | ICD-10-CM

## 2016-11-03 NOTE — Progress Notes (Signed)
  Subjective:   Patient ID: Bonnie Martin, female    DOB: 14-Mar-1961, 56 y.o.   MRN: [id number] CC: workers comp - initial (right knee pain / fall)  HPI: Bonnie Martin is a 56 y.o. female presenting for workers comp - initial (right knee pain / fall)  Felt R foot slip, stepped down hard on R leg Happened this morning around 8:15 Has been having throbbing pain in knee in the front Noticed swelling in knee  Thinks she migh thave hit her R knee hard   ROS: Per HPI   Objective:    BP 128/75 (BP Location: Right Arm)   Pulse 72   Temp 98.3 F (36.8 C) (Oral)   Ht 5\' 4"  (1.626 m)   Wt 289 lb (131.1 kg)   BMI 49.61 kg/m   Wt Readings from Last 3 Encounters:  11/03/16 289 lb (131.1 kg)  01/31/14 290 lb (131.5 kg)  03/28/09 (!) 307 lb (139.3 kg)    Gen: NAD, alert, cooperative with exam, NCAT EYES: EOMI, no conjunctival injection, or no icterus Resp: normal WOB Neuro: Alert and oriented, strength equal ext/flex knees b/l MSK: no point tenderness along joint line Small amount swelling, redness over R knee cap No pain with patellar rocking No laxity in ligaments R knee  Assessment & Plan:  Mindi JunkerMarsha was seen today for workers comp - initial.  Diagnoses and all orders for this visit:  Right knee pain, unspecified chronicity Swelling, tenderness R knee Xray with no acute fracture, does have some arthritis rec rest, ice, NSAIDs, last Cr nl 6 months ago Limit walking to no more than 5 min an hour at work for the next week Limit stairs to none for the next week If still with pain limiting activities after a week needs to be seen for follow up -     DG Knee 1-2 Views Right; Future  Follow up plan: 1 week as needed Rex Krasarol Eisen Robenson, [facility]

## 2016-11-03 NOTE — Patient Instructions (Signed)
If still with pain limiting activities in 1 week, needs to be seen for follow up Otherwise OK to resume regular activities

## 2016-11-07 ENCOUNTER — Encounter: Payer: Self-pay | Admitting: Pediatrics

## 2016-11-07 ENCOUNTER — Ambulatory Visit (INDEPENDENT_AMBULATORY_CARE_PROVIDER_SITE_OTHER): Payer: Worker's Compensation | Admitting: Pediatrics

## 2016-11-07 VITALS — BP 125/71 | HR 67 | Temp 98.5°F | Ht 64.0 in | Wt 290.2 lb

## 2016-11-07 DIAGNOSIS — M25561 Pain in right knee: Secondary | ICD-10-CM | POA: Diagnosis not present

## 2016-11-07 MED ORDER — NAPROXEN 500 MG PO TABS: 500.0000 mg | ORAL_TABLET | Freq: Two times a day (BID) | ORAL | 0 refills | Status: DC

## 2016-11-07 NOTE — Patient Instructions (Signed)
Naproxen 500mg  twice a day Ice knee as needed

## 2016-11-07 NOTE — Progress Notes (Signed)
  Subjective:   Patient ID: Bonnie Martin, female    DOB: [birth year], 56 y.o.   MRN: [id number] CC: Workers Comp Follow up (Knee pain, Right)  HPI: Bonnie Martin is a 56 y.o. female presenting for Workers Comp Follow up (Knee pain, Right)  Injury 11/03/2016, stepping hard on R foot after missing a step walking down stairs Immediate pain no popping sounds, able to weight bear after indcident See note from 2/19 for more details  Now with continued pain, back in clinic because concern pain not imporivng faster Has been avoiding stairs and walking long distances When driving sometimes will feel sharp pain in the front of her knee Has sharp pain where bruise is on anterior R knee Has been taking ibuprofen 400mg  once a day Knee hurts when she first stands and starts walking, then pain starts to subside after she has been up moving on it Xray 5 days ago without acute findings but 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 Allergies and medications reviewed and updated. History  Smoking Status  . Never Smoker  Smokeless Tobacco  . Never Used   ROS: Per HPI   Objective:    BP 125/71   Pulse 67   Temp 98.5 F (36.9 C) (Oral)   Ht 5\' 4"  (1.626 m)   Wt 290 lb 3.2 oz (131.6 kg)   BMI 49.81 kg/m   Wt Readings from Last 3 Encounters:  11/07/16 290 lb 3.2 oz (131.6 kg)  11/03/16 289 lb (131.1 kg)  01/31/14 290 lb (131.5 kg)    Gen: NAD, alert, cooperative with exam, NCAT EYES: EOMI, no conjunctival injection, or no icterus Ext: No edema, warm Neuro: Alert and oriented, sensation intact LE MSK:  R knee: no joint line tenderness Slight bruise and superficial abrasion over R anterior patella Swelling improved, no focal area of swelling present last visit Straightening knee causes sharp pain anterior knee No pain over distal quad No joint  laxity TTP over anterior patella Equal strenght b/l extension and flex of knees, no pain R knee with ext/flexion of knees  Assessment & Plan:  Bonnie Martin was seen today for workers comp follow up.  Diagnoses and all orders for this visit:  Acute pain of right knee NSAIDs, rest, cont no walking more than 5 min in an hour, avoiding stairs until ortho eval -     naproxen (NAPROSYN) 500 MG tablet; Take 1 tablet (500 mg total) by mouth 2 (two) times daily with a meal. -     Ambulatory referral to Orthopedic Surgery  Follow up plan: After ortho eval as needed Rex Krasarol Vincent, MD [facility]

## 2016-12-15 ENCOUNTER — Telehealth: Payer: Self-pay | Admitting: *Deleted

## 2016-12-15 NOTE — Telephone Encounter (Signed)
Received call back from [employer], adjuster with [employer].  He states the claim has been denied so the referral was not approved.  Patient had been notified of this previously, but I called to confirm.  Left patient a voicemail asking her to call me back and let me know if she would like me to proceed with the referral through her insurance.

## 2016-12-15 NOTE — Telephone Encounter (Signed)
Spoke with patient- she states she has not heard from [facility] regarding her Orthopedic referral.  Advised her the referral information had been sent to the adjuster twice, and will follow up on this.  Left voicemail for Germaine Pomfret and JR Knotts who are adjusters for [facility] claims.

## 2016-12-15 NOTE — Telephone Encounter (Signed)
What type of referral do you need? w/c ortho  Have you been seen at our office for this problem? yes (If no, schedule them an appointment.  They will need to be seen before a referral can be done.)  Is there a particular doctor or location that you prefer? No. Please let her know something today.  Patient notified that referrals can take up to a week or longer to process. If they haven't heard anything within a week they should call back and speak with the referral department.

## 2017-01-25 DIAGNOSIS — E039 Hypothyroidism, unspecified: Secondary | ICD-10-CM | POA: Insufficient documentation

## 2017-01-25 DIAGNOSIS — R7989 Other specified abnormal findings of blood chemistry: Secondary | ICD-10-CM | POA: Insufficient documentation

## 2017-09-14 ENCOUNTER — Ambulatory Visit: Payer: BC Managed Care – PPO | Attending: Orthopaedic Surgery | Admitting: Physical Therapy

## 2017-09-14 ENCOUNTER — Encounter: Payer: Self-pay | Admitting: Physical Therapy

## 2017-09-14 DIAGNOSIS — G8929 Other chronic pain: Secondary | ICD-10-CM | POA: Diagnosis present

## 2017-09-14 DIAGNOSIS — M5442 Lumbago with sciatica, left side: Secondary | ICD-10-CM | POA: Diagnosis not present

## 2017-09-14 DIAGNOSIS — R293 Abnormal posture: Secondary | ICD-10-CM | POA: Diagnosis present

## 2017-09-14 NOTE — Therapy (Signed)
[facility] [address] [place], [place], [address] Phone: [phone]   Fax:  [phone]  Physical Therapy Evaluation  Patient Details  Name: Bonnie Martin MRN: [id number] Date of Birth: December 19, 1960 Referring Provider: Benedict Needy MD   Encounter Date: 09/14/2017  PT End of Session - 09/14/17 1113    Visit Number  1    Number of Visits  12    Date for PT Re-Evaluation  10/26/17    PT Start Time  0951    PT Stop Time  1042    PT Time Calculation (min)  51 min    Activity Tolerance  Patient tolerated treatment well    Behavior During Therapy  [facility] for tasks assessed/performed       Past Medical History:  Diagnosis Date  . Hypertension     Past Surgical History:  Procedure Laterality Date  . ABDOMINAL HYSTERECTOMY      There were no vitals filed for this visit.   Subjective Assessment - 09/14/17 1120    Subjective  The patient presents to the clinic today with h/o low back and LE pain since 04/2016.  She reports he back pain has worsened recently and she can no longer walk long without having to sit (pain reaching a 10/10).  She experiences numbness into her left lateral thigh.  She also has a h/o right knee pain.  Per patient report she was told by one doctor she had a 30% slippage (spondylolithesis) and another doctor said 50%.  Her resting pain-level is a 4/10.  Sitting decreases her pain.      Limitations  Standing    How long can you stand comfortably?  Cannot walk through store to shop without having to stop due to intense low back pain.    Patient Stated Goals  Get out of pain.      Currently in Pain?  Yes    Pain Score  4     Pain Location  Back    Pain Orientation  Mid Left lateral thigh.    Pain Descriptors / Indicators  Aching;Numbness    Pain Type  Chronic pain    Pain Radiating Towards  Left LE.    Pain Onset  More than a month ago    Pain Frequency  Constant    Aggravating Factors   See above.    Pain Relieving Factors  See above.         [facility] PT Assessment - 09/14/17 0001      Assessment   Medical Diagnosis  Spondylolithesis of lumbar region.    Referring Provider  Benedict Needy MD    Onset Date/Surgical Date  -- 04/2016.      Precautions   Precautions  -- Avoid spinal extension.      Restrictions   Weight Bearing Restrictions  No      Balance Screen   Has the patient fallen in the past 6 months  No    Has the patient had a decrease in activity level because of a fear of falling?   No    Is the patient reluctant to leave their home because of a fear of falling?   No      Home Environment   Living Environment  Private residence      Prior Function   Level of Independence  Independent      Posture/Postural Control   Posture/Postural Control  Postural limitations    Postural Limitations  Increased lumbar lordosis  ROM / Strength   AROM / PROM / Strength  AROM;Strength      AROM   Overall AROM Comments  Full lumbar flexion.  Deferred spinal extension due to Spondylolithesis.      Strength   Overall Strength Comments  Normal bilateral LE strength.      Palpation   Palpation comment  Tender to palpation with overpressure at L4.      Special Tests    Special Tests  Lumbar;Leg LengthTest Absent LE DTR's.    Lumbar Tests  -- (-)SLR and Slump testing.    Leg length test   -- (=) leg lengths.      Transfers   Comments  Sit to stand and supine to sit independently.      Ambulation/Gait   Gait Comments  Normal gait pattern.             Objective measurements completed on examination: See above findings.      [facility] Adult PT Treatment/Exercise - 09/14/17 0001      Modalities   Modalities  Electrical Stimulation;Moist Heat      Moist Heat Therapy   Number Minutes Moist Heat  15 Minutes      Electrical Stimulation   Electrical Stimulation Location  Lower lumbar.    Electrical Stimulation Action  Pre-mod.    Electrical Stimulation Parameters   80-150 Hz x 15 minutes.    Electrical Stimulation Goals  Pain                  PT Long Term Goals - 09/14/17 1308      PT LONG TERM GOAL #1   Title  Ind with a HEP.    Time  6    Period  Weeks    Status  New      PT LONG TERM GOAL #2   Title  Walk a community distance with pain not > 3-4/10.    Time  6    Period  Weeks    Status  New      PT LONG TERM GOAL #3   Title  Eliminate left LE symptoms.    Time  6    Period  Weeks    Status  New             Plan - 09/14/17 1255    Clinical Impression Statement  The patient presents to OPPT with c/o chronic low back pain with numbness over her left lateral thigh.  She has a significant Spondlolisthesis and has severe pain afetr walking awhile.  LE DTR's bilaterally are absent.  She has normal LE strength and normal spinal flexion.  The patient will benefit from progression into a core exercise program.    Clinical Presentation  Evolving    Clinical Decision Making  Low    Rehab Potential  Good    PT Frequency  2x / week    PT Duration  6 weeks    PT Treatment/Interventions  Electrical Stimulation;Moist Heat;Therapeutic activities;Therapeutic exercise;Patient/family education;Manual techniques    PT Next Visit Plan  Core exercise program.    Consulted and Agree with Plan of Care  Patient       Patient will benefit from skilled therapeutic intervention in order to improve the following deficits and impairments:  Postural dysfunction, Pain, Decreased activity tolerance  Visit Diagnosis: Chronic midline low back pain with left-sided sciatica - Plan: PT plan of care cert/re-cert  Abnormal posture - Plan: PT plan of care cert/re-cert  Problem List Patient Active Problem List   Diagnosis Date Noted  . Degenerative arthritis of knee, bilateral 01/31/2014  . DYSPEPSIA 03/28/2009  . ABDOMINAL TENDERNESS OTHER SPECIFIED SITE 03/28/2009  . HELICOBACTER PYLORI GASTRITIS 03/27/2009  . GERD 03/27/2009  .  HEARTBURN 03/27/2009    APPLEGATE, [place] MPT 09/14/2017, 1:12 PM  [facility] [address] [place], [place], [address] Phone: [phone]   Fax:  [phone]  Name: Bonnie Martin MRN: [id number] Date of Birth: 05-Jan-1961

## 2017-09-22 ENCOUNTER — Ambulatory Visit: Payer: BC Managed Care – PPO | Attending: Orthopaedic Surgery | Admitting: *Deleted

## 2017-09-22 DIAGNOSIS — G8929 Other chronic pain: Secondary | ICD-10-CM

## 2017-09-22 DIAGNOSIS — M5442 Lumbago with sciatica, left side: Secondary | ICD-10-CM | POA: Insufficient documentation

## 2017-09-22 DIAGNOSIS — R293 Abnormal posture: Secondary | ICD-10-CM

## 2017-09-22 NOTE — Patient Instructions (Signed)
Isometric Abdominal   Lying on back with knees bent, tighten stomach by pulling navel down. Hold ____ seconds.                                PELVIC tilts --     Round out LB- mobility exercise                                                 Repeat __5__ times per set. Do __5__ sets per session. Do _3-5___ sessions per day.  http://orth.exer.us/1086   Copyright  VHI. All rights reserved.  Bent Leg Lift (Hook-Lying)   Tighten stomach and slowly raise right leg _6___ inches from floor. Keep trunk rigid. Hold _2-3___ seconds. Repeat _10___ times per set. Do _3___ sets per session. Do _2-3___ sessions per day.  http://orth.exer.us/1090   Copyright  VHI. All rights reserved.  Bridging   Slowly raise buttocks from floor, keeping stomach tight. Repeat __10__ times per set. Do _3___ sets per session. Do __2-3__ sessions per day.  http://orth.exer.us/1096   Copyright  VHI. All rights reserved.

## 2017-09-22 NOTE — Therapy (Signed)
[facility] [address] [place], [place], [address] Phone: [phone]   Fax:  [phone]  Physical Therapy Treatment  Patient Details  Name: Bonnie Martin MRN: [id number] Date of Birth: December 16, 1960 Referring Provider: Benedict Needy MD   Encounter Date: 09/22/2017  PT End of Session - 09/22/17 1734    Visit Number  2    Number of Visits  12    Date for PT Re-Evaluation  10/26/17    PT Start Time  1644    PT Stop Time  1735    PT Time Calculation (min)  51 min       Past Medical History:  Diagnosis Date  . Hypertension     Past Surgical History:  Procedure Laterality Date  . ABDOMINAL HYSTERECTOMY      There were no vitals filed for this visit.  Subjective Assessment - 09/22/17 1641    Subjective  The patient presents to the clinic today with h/o low back and LE pain since 04/2016.  She reports he back pain has worsened recently and she can no longer walk long without having to sit (pain reaching a 10/10).  She experiences numbness into her left lateral thigh.  She also has a h/o right knee pain.  Per patient report she was told by one doctor she had a 30% slippage (spondylolithesis) and another doctor said 50%.  Her resting pain-level is a 4/10.  Sitting decreases her pain.      Limitations  Standing    How long can you stand comfortably?  Cannot walk through store to shop without having to stop due to intense low back pain.    Patient Stated Goals  Get out of pain.                        [facility] Adult PT Treatment/Exercise - 09/22/17 0001      Therapeutic Activites    Therapeutic Activities  ADL's    ADL's  Sleeping postures reviewed and the use of pillows to decrease LB stress.      Exercises   Exercises  Lumbar;Knee/Hip      Lumbar Exercises: Supine   Ab Set  20 reps;3 seconds Drawin Core activation. In sitting and standing reviewed    Bent Knee Raise  20 reps;3 seconds with Drawin    Other  Supine Lumbar Exercises  posterior pelvic tilt 2x10      Modalities   Modalities  Electrical Stimulation;Moist Heat      Moist Heat Therapy   Number Minutes Moist Heat  15 Minutes    Moist Heat Location  Lumbar Spine in sitting      Electrical Stimulation   Electrical Stimulation Location  Lower lumbar. premod x 15 mins 80-150hz  in sitting    Electrical Stimulation Goals  Pain                  PT Long Term Goals - 09/14/17 1308      PT LONG TERM GOAL #1   Title  Ind with a HEP.    Time  6    Period  Weeks    Status  New      PT LONG TERM GOAL #2   Title  Walk a community distance with pain not > 3-4/10.    Time  6    Period  Weeks    Status  New      PT LONG TERM GOAL #3  Title  Eliminate left LE symptoms.    Time  6    Period  Weeks    Status  New            Plan - 09/22/17 1646    Clinical Impression Statement  Pt arrived toClinic doing fairly well with 4-5/10 LBP. Rx focused on core activation therex and sleeping postures and the use of pillows to relieve stress on LB. HEP initiated and handouts given.. Normal response to modalities after removal of modalities    Clinical Presentation  Evolving    Clinical Decision Making  Low    Rehab Potential  Good    PT Frequency  2x / week    PT Duration  6 weeks    PT Treatment/Interventions  Electrical Stimulation;Moist Heat;Therapeutic activities;Therapeutic exercise;Patient/family education;Manual techniques    PT Next Visit Plan  Core exercise program.    Consulted and Agree with Plan of Care  Patient       Patient will benefit from skilled therapeutic intervention in order to improve the following deficits and impairments:  Postural dysfunction, Pain, Decreased activity tolerance  Visit Diagnosis: Chronic midline low back pain with left-sided sciatica  Abnormal posture     Problem List Patient Active Problem List   Diagnosis Date Noted  . Degenerative arthritis of knee, bilateral 01/31/2014   . DYSPEPSIA 03/28/2009  . ABDOMINAL TENDERNESS OTHER SPECIFIED SITE 03/28/2009  . HELICOBACTER PYLORI GASTRITIS 03/27/2009  . GERD 03/27/2009  . HEARTBURN 03/27/2009    Khyson Sebesta,CHRIS, PTA 09/22/2017, 5:35 PM  [facility] [address] [place], [place], [address] Phone: [phone]   Fax:  [phone]  Name: Thea SilversmithMarsha F Joyce-Tatum MRN: [id number] Date of Birth: [birth year]

## 2017-09-24 ENCOUNTER — Encounter: Payer: BC Managed Care – PPO | Admitting: *Deleted

## 2017-09-29 ENCOUNTER — Encounter: Payer: BC Managed Care – PPO | Admitting: Physical Therapy

## 2017-10-01 ENCOUNTER — Encounter: Payer: BC Managed Care – PPO | Admitting: *Deleted

## 2017-10-06 ENCOUNTER — Encounter: Payer: BC Managed Care – PPO | Admitting: *Deleted

## 2017-10-08 ENCOUNTER — Ambulatory Visit: Payer: BC Managed Care – PPO | Admitting: *Deleted

## 2017-10-08 DIAGNOSIS — G8929 Other chronic pain: Secondary | ICD-10-CM

## 2017-10-08 DIAGNOSIS — M5442 Lumbago with sciatica, left side: Principal | ICD-10-CM

## 2017-10-08 DIAGNOSIS — R293 Abnormal posture: Secondary | ICD-10-CM

## 2017-10-08 NOTE — Therapy (Signed)
[facility] [address] [place], [place], [address] Phone: [phone]   Fax:  [phone]  Physical Therapy Treatment  Patient Details  Name: Bonnie SilversmithMarsha F Martin MRN: [id number] Date of Birth: Sep 01, 1961 Referring Provider: Benedict Needyoss McEntarfer MD   Encounter Date: 10/08/2017  PT End of Session - 10/08/17 1710    Visit Number  3    Number of Visits  12    Date for PT Re-Evaluation  10/26/17    PT Start Time  1651    PT Stop Time  1739    PT Time Calculation (min)  48 min    Activity Tolerance  Patient tolerated treatment well    Behavior During Therapy  [facility] for tasks assessed/performed       Past Medical History:  Diagnosis Date  . Hypertension     Past Surgical History:  Procedure Laterality Date  . ABDOMINAL HYSTERECTOMY      There were no vitals filed for this visit.  Subjective Assessment - 10/08/17 1653    Subjective  The patient presents to the clinic today with h/o low back and LE pain since 04/2016.  She reports he back pain has worsened recently and she can no longer walk long without having to sit (pain reaching a 10/10).  She experiences numbness into her left lateral thigh.  She also has a h/o right knee pain.  Per patient report she was told by one doctor she had a 30% slippage (spondylolithesis) and another doctor said 50%.  Her resting pain-level is a 4/10.  Sitting decreases her pain.      Limitations  Standing    How long can you stand comfortably?  Cannot walk through store to shop without having to stop due to intense low back pain.    Patient Stated Goals  Get out of pain.      Currently in Pain?  Yes    Pain Score  6     Pain Location  Back    Pain Descriptors / Indicators  Aching    Pain Onset  More than a month ago                      [facility] Treatment/Exercise - 10/08/17 0001      Therapeutic Activites    Therapeutic Activities  ADL's    ADL's  Sleeping postures reviewed and  the use of pillows to decrease LB stress.      Exercises   Exercises  Lumbar;Knee/Hip      Lumbar Exercises: Seated   Other Seated Lumbar Exercises  Pelvic tilts with focus on nuetral and posterior 2x10      Lumbar Exercises: Supine   Ab Set  20 reps;3 seconds Drawin Core activation. In sitting and standing reviewed    Bent Knee Raise  20 reps;3 seconds with Drawin    Other Supine Lumbar Exercises  posterior pelvic tilt 2x10      Modalities   Modalities  Electrical Stimulation;Moist Heat      Moist Heat Therapy   Number Minutes Moist Heat  15 Minutes    Moist Heat Location  Lumbar Spine      Electrical Stimulation   Electrical Stimulation Location  Lower lumbar. premod x 15 mins 80-150hz  in sitting    Electrical Stimulation Action  In sitting    Electrical Stimulation Goals  Pain                  PT  Long Term Goals - 09/14/17 1308      PT LONG TERM GOAL #1   Title  Ind with a HEP.    Time  6    Period  Weeks    Status  New      PT LONG TERM GOAL #2   Title  Walk a community distance with pain not > 3-4/10.    Time  6    Period  Weeks    Status  New      PT LONG TERM GOAL #3   Title  Eliminate left LE symptoms.    Time  6    Period  Weeks    Status  New            Plan - 10/08/17 1701    Clinical Impression Statement  Pt arrived today doing fairly well, but with pain radiating into hip and knee. Core activation exs reviewed and sitting pelvic tilts were added to HEP for pelvic control and pain reduction. Normal response to modalities. Pt tolerates the sitting position better than supine.    Clinical Presentation  Evolving    Clinical Decision Making  Low    Rehab Potential  Good    PT Frequency  2x / week    PT Duration  6 weeks    PT Treatment/Interventions  Electrical Stimulation;Moist Heat;Therapeutic activities;Therapeutic exercise;Patient/family education;Manual techniques    PT Next Visit Plan  Core exercise program.       Patient will  benefit from skilled therapeutic intervention in order to improve the following deficits and impairments:  Postural dysfunction, Pain, Decreased activity tolerance  Visit Diagnosis: Chronic midline low back pain with left-sided sciatica  Abnormal posture     Problem List Patient Active Problem List   Diagnosis Date Noted  . Degenerative arthritis of knee, bilateral 01/31/2014  . DYSPEPSIA 03/28/2009  . ABDOMINAL TENDERNESS OTHER SPECIFIED SITE 03/28/2009  . HELICOBACTER PYLORI GASTRITIS 03/27/2009  . GERD 03/27/2009  . HEARTBURN 03/27/2009    [name],CHRIS, PTA 10/08/2017, 5:41 PM  [facility] [address] [place], [place], [address] Phone: [phone]   Fax:  [phone]  Name: Bonnie Martin MRN: [id number] Date of Birth: 1961-05-08

## 2017-10-13 ENCOUNTER — Encounter: Payer: BC Managed Care – PPO | Admitting: *Deleted

## 2017-10-15 ENCOUNTER — Encounter: Payer: BC Managed Care – PPO | Admitting: *Deleted

## 2017-10-20 ENCOUNTER — Ambulatory Visit: Payer: BC Managed Care – PPO | Attending: Orthopaedic Surgery | Admitting: Physical Therapy

## 2017-10-20 ENCOUNTER — Encounter: Payer: Self-pay | Admitting: Physical Therapy

## 2017-10-20 DIAGNOSIS — M5442 Lumbago with sciatica, left side: Secondary | ICD-10-CM | POA: Diagnosis not present

## 2017-10-20 DIAGNOSIS — R293 Abnormal posture: Secondary | ICD-10-CM

## 2017-10-20 DIAGNOSIS — G8929 Other chronic pain: Secondary | ICD-10-CM | POA: Diagnosis present

## 2017-10-20 NOTE — Therapy (Signed)
[facility] [address] [place], [place], [address] Phone: [phone]   Fax:  [phone]  Physical Therapy Treatment  Patient Details  Name: Bonnie Martin MRN: [id number] Date of Birth: July 16, 1961 Referring Provider: Benedict Needy MD   Encounter Date: 10/20/2017  PT End of Session - 10/20/17 1654    Visit Number  4    Number of Visits  12    Date for PT Re-Evaluation  10/26/17    PT Start Time  0445    PT Stop Time  0541    PT Time Calculation (min)  56 min       Past Medical History:  Diagnosis Date  . Hypertension     Past Surgical History:  Procedure Laterality Date  . ABDOMINAL HYSTERECTOMY      There were no vitals filed for this visit.  Subjective Assessment - 10/20/17 1655    Subjective  I'm not too bad today.    How long can you stand comfortably?  Cannot walk through store to shop without having to stop due to intense low back pain.    Pain Score  5     Pain Location  Back    Pain Orientation  Mid    Pain Descriptors / Indicators  Aching    Pain Type  Chronic pain    Pain Onset  More than a month ago                      [facility] Adult PT Treatment/Exercise - 10/20/17 0001      Exercises   Exercises  Knee/Hip      Lumbar Exercises: Aerobic   Nustep  Level 3 x 15 minutes with periodic draw-ins x 15 minutes with cadenece at 60 steps min or greater.      Knee/Hip Exercises: Standing   Other Standing Knee Exercises  Standing with gree XTS band with draw-in activation:  Scapular retraction; shoulder extension and forward punches 2 x fatigue.      Knee/Hip Exercises: Supine   Bridges Limitations  Hip bridges one set to fatigue.      Modalities   Modalities  Electrical Stimulation;Moist Heat      Moist Heat Therapy   Number Minutes Moist Heat  20 Minutes    Moist Heat Location  Lumbar Spine      Electrical Stimulation   Electrical Stimulation Location  Lower lumbar.    Electrical Stimulation Action  Seated:  Pre-mod.    Electrical Stimulation Parameters  Constant at 80-150 HZ x 20 minutes.    Electrical Stimulation Goals  Pain      Manual Therapy   Manual Therapy  Passive ROM    Passive ROM  In supine:  PROM with long holds into hip flexion bilaterally.                  PT Long Term Goals - 09/14/17 1308      PT LONG TERM GOAL #1   Title  Ind with a HEP.    Time  6    Period  Weeks    Status  New      PT LONG TERM GOAL #2   Title  Walk a community distance with pain not > 3-4/10.    Time  6    Period  Weeks    Status  New      PT LONG TERM GOAL #3   Title  Eliminate left LE symptoms.  Time  6    Period  Weeks    Status  New            Plan - 10/20/17 1831    Clinical Impression Statement  The patient did very well today with exercise.  Cues required with XTS bands for technique and to perform repetitions slower.    PT Treatment/Interventions  Electrical Stimulation;Moist Heat;Therapeutic activities;Therapeutic exercise;Patient/family education;Manual techniques    PT Next Visit Plan  Core exercise program.    Consulted and Agree with Plan of Care  Patient       Patient will benefit from skilled therapeutic intervention in order to improve the following deficits and impairments:  Postural dysfunction, Pain, Decreased activity tolerance  Visit Diagnosis: Chronic midline low back pain with left-sided sciatica  Abnormal posture     Problem List Patient Active Problem List   Diagnosis Date Noted  . Degenerative arthritis of knee, bilateral 01/31/2014  . DYSPEPSIA 03/28/2009  . ABDOMINAL TENDERNESS OTHER SPECIFIED SITE 03/28/2009  . HELICOBACTER PYLORI GASTRITIS 03/27/2009  . GERD 03/27/2009  . HEARTBURN 03/27/2009    Glenys Snader, [place] MPT 10/20/2017, 6:35 PM  [facility] [address] [place], [place], [address] Phone: [phone]   Fax:  [phone]  Name:  Thea SilversmithMarsha F Joyce-Tatum MRN: [id number] Date of Birth: 12-06-1960

## 2017-10-22 ENCOUNTER — Ambulatory Visit: Payer: BC Managed Care – PPO | Admitting: *Deleted

## 2017-10-22 DIAGNOSIS — M5442 Lumbago with sciatica, left side: Principal | ICD-10-CM

## 2017-10-22 DIAGNOSIS — G8929 Other chronic pain: Secondary | ICD-10-CM

## 2017-10-22 DIAGNOSIS — R293 Abnormal posture: Secondary | ICD-10-CM

## 2017-10-22 NOTE — Therapy (Addendum)
[facility] [place], [place], [address] Phone: [phone]   Fax:  [phone]  Physical Therapy Treatment  Patient Details  Name: Bonnie Martin MRN: [id number] Date of Birth: September 17, 1960 Referring Provider: Latricia Heft MD   Encounter Date: 10/22/2017  PT End of Session - 10/22/17 1721    Visit Number  5    Number of Visits  12    Date for PT Re-Evaluation  10/26/17    PT Start Time  4765    PT Stop Time  1733    PT Time Calculation (min)  46 min       Past Medical History:  Diagnosis Date  . Hypertension     Past Surgical History:  Procedure Laterality Date  . ABDOMINAL HYSTERECTOMY      There were no vitals filed for this visit.                   [facility] Adult PT Treatment/Exercise - 10/22/17 0001      Exercises   Exercises  Knee/Hip      Lumbar Exercises: Supine   Ab Set  20 reps;3 seconds Drawin Core activation. In sitting and standing reviewed    Bent Knee Raise  20 reps;3 seconds with Drawin    Dead Bug  20 reps;3 seconds      Modalities   Modalities  Electrical Stimulation;Moist Heat      Moist Heat Therapy   Number Minutes Moist Heat  20 Minutes    Moist Heat Location  Lumbar Spine      Electrical Stimulation   Electrical Stimulation Location  Lower lumbar.    Electrical Stimulation Goals  Pain      Manual Therapy   Manual Therapy  Passive ROM    Passive ROM  In supine:  PROM with long holds into hip flexion LT , Attempted piriformis stretching, but Pt had RT knee pain                   PT Long Term Goals - 09/14/17 1308      PT LONG TERM GOAL #1   Title  Ind with a HEP.    Time  6    Period  Weeks    Status  New      PT LONG TERM GOAL #2   Title  Walk a community distance with pain not > 3-4/10.    Time  6    Period  Weeks    Status  New      PT LONG TERM GOAL #3   Title  Eliminate left LE symptoms.    Time  6    Period  Weeks    Status   New            Plan - 10/22/17 1722    Clinical Impression Statement  Pt arrived today doing fair,but her RT knee and hip were having increased pain today and pt requested not to do the Nustep. PROM was performed to RT LE and held for hip and LB stretching. Piriformis  stretch attempted, but  pt had RT knee pain. Core exs were also performed  with verbal and tactile cues were needed. Normal response to modalities in sitting.    Clinical Presentation  Evolving    Clinical Decision Making  Low    Rehab Potential  Good    PT Frequency  2x / week    PT Duration  6 weeks  PT Treatment/Interventions  Electrical Stimulation;Moist Heat;Therapeutic activities;Therapeutic exercise;Patient/family education;Manual techniques    PT Next Visit Plan  Core exercise program.    Consulted and Agree with Plan of Care  Patient       Patient will benefit from skilled therapeutic intervention in order to improve the following deficits and impairments:  Postural dysfunction, Pain, Decreased activity tolerance  Visit Diagnosis: Chronic midline low back pain with left-sided sciatica  Abnormal posture     Problem List Patient Active Problem List   Diagnosis Date Noted  . Degenerative arthritis of knee, bilateral 01/31/2014  . DYSPEPSIA 03/28/2009  . ABDOMINAL TENDERNESS OTHER SPECIFIED SITE 03/28/2009  . HELICOBACTER PYLORI GASTRITIS 03/27/2009  . GERD 03/27/2009  . HEARTBURN 03/27/2009    Bonnell Placzek,CHRIS, PTA 10/22/2017, 5:33 PM  [facility] [address] [place], [place], [address] Phone: [phone]   Fax:  [phone]  Name: GIZELLE WHETSEL MRN: [id number] Date of Birth: 10-16-1960  PHYSICAL THERAPY DISCHARGE SUMMARY  Visits from Start of Care: 5.  Current functional level related to goals / functional outcomes: See above.   Remaining deficits: See below.   Education / Equipment: HEP. Plan: Patient agrees to discharge.   Patient goals were not met. Patient is being discharged due to not returning since the last visit.  ?????         Mali Applegate MPT

## 2017-10-27 ENCOUNTER — Encounter: Payer: BC Managed Care – PPO | Admitting: Physical Therapy

## 2017-10-29 ENCOUNTER — Ambulatory Visit: Payer: BC Managed Care – PPO | Admitting: *Deleted

## 2017-11-12 ENCOUNTER — Other Ambulatory Visit: Payer: Self-pay | Admitting: Nurse Practitioner

## 2017-11-12 DIAGNOSIS — N6320 Unspecified lump in the left breast, unspecified quadrant: Secondary | ICD-10-CM

## 2017-11-17 ENCOUNTER — Other Ambulatory Visit: Payer: BC Managed Care – PPO

## 2017-11-19 ENCOUNTER — Ambulatory Visit
Admission: RE | Admit: 2017-11-19 | Discharge: 2017-11-19 | Disposition: A | Discharge status: Home / Self Care | Payer: BC Managed Care – PPO | Source: Ambulatory Visit | Attending: Nurse Practitioner | Admitting: Nurse Practitioner

## 2017-11-19 DIAGNOSIS — N6320 Unspecified lump in the left breast, unspecified quadrant: Secondary | ICD-10-CM

## 2018-04-23 DIAGNOSIS — M4310 Spondylolisthesis, site unspecified: Secondary | ICD-10-CM | POA: Insufficient documentation

## 2018-04-23 DIAGNOSIS — M5416 Radiculopathy, lumbar region: Secondary | ICD-10-CM | POA: Insufficient documentation

## 2018-05-12 DIAGNOSIS — E782 Mixed hyperlipidemia: Secondary | ICD-10-CM | POA: Insufficient documentation

## 2019-11-23 LAB — COLOGUARD: COLOGUARD: NEGATIVE

## 2019-12-13 ENCOUNTER — Other Ambulatory Visit: Payer: Self-pay | Admitting: Nurse Practitioner

## 2019-12-13 DIAGNOSIS — Z1231 Encounter for screening mammogram for malignant neoplasm of breast: Secondary | ICD-10-CM

## 2019-12-14 ENCOUNTER — Other Ambulatory Visit: Payer: Self-pay

## 2019-12-14 ENCOUNTER — Ambulatory Visit
Admission: RE | Admit: 2019-12-14 | Discharge: 2019-12-14 | Disposition: A | Discharge status: Home / Self Care | Payer: BC Managed Care – PPO | Source: Ambulatory Visit | Attending: Nurse Practitioner | Admitting: Nurse Practitioner

## 2019-12-14 DIAGNOSIS — Z1231 Encounter for screening mammogram for malignant neoplasm of breast: Secondary | ICD-10-CM

## 2020-09-05 ENCOUNTER — Telehealth: Payer: Self-pay | Admitting: Infectious Diseases

## 2020-09-05 NOTE — Telephone Encounter (Signed)
Called to Discuss with patient about Covid symptoms and the use of the monoclonal antibody infusion for those with mild to moderate Covid symptoms and at a high risk of hospitalization.     Pt appears to qualify for this infusion due to co-morbid conditions and/or a member of an at-risk group in accordance with the FDA Emergency Use Authorization.    Unable to reach pt - LVM and sent mychart. She is unfortunately out of the window of treatment.    Rexene Alberts, MSN, NP-C [facility] for Infectious Disease [facility]  [email or web address] Pager: [phone] Office: [phone] [facility] Main Line: [phone]

## 2021-02-21 ENCOUNTER — Other Ambulatory Visit: Payer: Self-pay | Admitting: Nurse Practitioner

## 2021-02-21 DIAGNOSIS — Z1231 Encounter for screening mammogram for malignant neoplasm of breast: Secondary | ICD-10-CM

## 2021-02-28 ENCOUNTER — Other Ambulatory Visit: Payer: Self-pay

## 2021-02-28 ENCOUNTER — Ambulatory Visit
Admission: RE | Admit: 2021-02-28 | Discharge: 2021-02-28 | Disposition: A | Discharge status: Home / Self Care | Payer: BC Managed Care – PPO | Source: Ambulatory Visit | Attending: Nurse Practitioner | Admitting: Nurse Practitioner

## 2021-02-28 DIAGNOSIS — Z1231 Encounter for screening mammogram for malignant neoplasm of breast: Secondary | ICD-10-CM

## 2021-04-18 ENCOUNTER — Ambulatory Visit: Payer: BC Managed Care – PPO

## 2021-06-10 ENCOUNTER — Emergency Department (HOSPITAL_COMMUNITY)
Admission: EM | Admit: 2021-06-10 | Discharge: 2021-06-10 | Disposition: A | Discharge status: Home / Self Care | Payer: BC Managed Care – PPO | Attending: Emergency Medicine | Admitting: Emergency Medicine

## 2021-06-10 ENCOUNTER — Other Ambulatory Visit: Payer: Self-pay

## 2021-06-10 ENCOUNTER — Encounter (HOSPITAL_COMMUNITY): Payer: Self-pay

## 2021-06-10 ENCOUNTER — Emergency Department (HOSPITAL_COMMUNITY): Payer: BC Managed Care – PPO

## 2021-06-10 DIAGNOSIS — Y93I9 Activity, other involving external motion: Secondary | ICD-10-CM | POA: Diagnosis not present

## 2021-06-10 DIAGNOSIS — S76011A Strain of muscle, fascia and tendon of right hip, initial encounter: Secondary | ICD-10-CM | POA: Insufficient documentation

## 2021-06-10 DIAGNOSIS — I10 Essential (primary) hypertension: Secondary | ICD-10-CM | POA: Insufficient documentation

## 2021-06-10 DIAGNOSIS — S86911A Strain of unspecified muscle(s) and tendon(s) at lower leg level, right leg, initial encounter: Secondary | ICD-10-CM | POA: Insufficient documentation

## 2021-06-10 DIAGNOSIS — S79911A Unspecified injury of right hip, initial encounter: Secondary | ICD-10-CM | POA: Diagnosis present

## 2021-06-10 DIAGNOSIS — Y999 Unspecified external cause status: Secondary | ICD-10-CM | POA: Insufficient documentation

## 2021-06-10 DIAGNOSIS — Y92410 Unspecified street and highway as the place of occurrence of the external cause: Secondary | ICD-10-CM | POA: Diagnosis not present

## 2021-06-10 DIAGNOSIS — R0789 Other chest pain: Secondary | ICD-10-CM | POA: Diagnosis not present

## 2021-06-10 HISTORY — DX: Disorder of thyroid, unspecified: E07.9

## 2021-06-10 MED ORDER — METHOCARBAMOL 500 MG PO TABS: 500.0000 mg | ORAL_TABLET | Freq: Three times a day (TID) | ORAL | 0 refills | Status: DC

## 2021-06-10 MED ORDER — IBUPROFEN 800 MG PO TABS: 800.0000 mg | ORAL_TABLET | Freq: Three times a day (TID) | ORAL | 0 refills | Status: DC

## 2021-06-10 NOTE — Discharge Instructions (Signed)
Your x-rays were negative to for any dislocations or broken bones.  I recommend that you apply ice on and off to your hip and your knee 2-3 times a day.  Follow-up with your primary doctor later this week for recheck.  I have also provided to orthopedic clinics that you may contact to arrange follow-up appointment if your symptoms or not improving

## 2021-06-10 NOTE — ED Notes (Signed)
Pt. Ambulated to room from lobby with steady gait.
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
## 2021-07-02 DIAGNOSIS — M549 Dorsalgia, unspecified: Secondary | ICD-10-CM | POA: Insufficient documentation

## 2021-08-13 ENCOUNTER — Other Ambulatory Visit: Payer: Self-pay

## 2021-08-13 ENCOUNTER — Ambulatory Visit: Payer: BC Managed Care – PPO | Attending: Pain Medicine

## 2021-08-13 DIAGNOSIS — M5416 Radiculopathy, lumbar region: Secondary | ICD-10-CM | POA: Diagnosis present
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
## 2021-08-15 ENCOUNTER — Ambulatory Visit: Payer: BC Managed Care – PPO | Admitting: Physical Therapy

## 2021-08-22 ENCOUNTER — Ambulatory Visit: Payer: BC Managed Care – PPO | Attending: Pain Medicine

## 2021-08-22 ENCOUNTER — Other Ambulatory Visit: Payer: Self-pay

## 2021-08-22 DIAGNOSIS — M5416 Radiculopathy, lumbar region: Secondary | ICD-10-CM | POA: Insufficient documentation
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
## 2021-08-27 ENCOUNTER — Ambulatory Visit: Payer: BC Managed Care – PPO

## 2021-08-27 ENCOUNTER — Other Ambulatory Visit: Payer: Self-pay

## 2021-08-27 DIAGNOSIS — M5416 Radiculopathy, lumbar region: Secondary | ICD-10-CM

## 2021-08-27 NOTE — Therapy (Signed)
[facility] [address] [place], [place], [address] Phone: [phone]   Fax:  [phone]  Physical Therapy Treatment  Patient Details  Name: Bonnie Martin MRN: [id number] Date of Birth: 03-Feb-1961 Referring Provider (PT): Junious Silk Date: 08/27/2021   PT End of Session - 08/27/21 1730     Visit Number 3    Number of Visits 12    Date for PT Re-Evaluation 11/01/21    PT Start Time 1645    PT Stop Time 1738    PT Time Calculation (min) 53 min    Activity Tolerance Patient tolerated treatment well    Behavior During Therapy [facility] for tasks assessed/performed             Past Medical History:  Diagnosis Date   Hypertension    Thyroid disease     Past Surgical History:  Procedure Laterality Date   ABDOMINAL HYSTERECTOMY     BREAST EXCISIONAL BIOPSY Left    THYROIDECTOMY, PARTIAL Left     There were no vitals filed for this visit.   Subjective Assessment - 08/27/21 1645     Subjective Patient reports that her back is still hurting today, but her right leg is bothering her the most right now.    Pertinent History R TKA, HTN, OA    Limitations Walking;Standing    How long can you sit comfortably? about 1 hour (driving)    How long can you stand comfortably? about 20 minutes    How long can you walk comfortably? about 20 minutes    Patient Stated Goals be able to go shopping without having to stop,    Currently in Pain? Yes    Pain Score 6     Pain Location Back    Pain Orientation Posterior;Lower    Pain Radiating Towards right LE    Pain Onset More than a month ago                               [facility] Adult PT Treatment/Exercise - 08/27/21 0001       Lumbar Exercises: Aerobic   Nustep L3 x 11 minutes      Modalities   Modalities Moist Heat;Electrical Stimulation      Moist Heat Therapy   Number Minutes Moist Heat 15 Minutes    Moist Heat Location Lumbar Spine       Electrical Stimulation   Electrical Stimulation Location Bilateral lumbar paraspinals    Electrical Stimulation Action IFC    Electrical Stimulation Parameters 80-150 Hz w/ 40% scan x 15 minutes    Electrical Stimulation Goals Pain      Manual Therapy   Manual Therapy Soft tissue mobilization;Joint mobilization    Joint Mobilization Lumbar CPA's grade I-III    Soft tissue mobilization Bilateral lumbar paraspinals, QL, and right ITB                          PT Long Term Goals - 08/13/21 1615       PT LONG TERM GOAL #1   Title Patient will be independent with her HEP.    Time 6    Period Weeks    Status New    Target Date 09/24/21      PT LONG TERM GOAL #2   Title Patient will be able to stand and walk for at least 40 minutes without being  limited by her familiar low back pain.    Baseline 20 minutes    Time 6    Period Weeks    Status New    Target Date 09/24/21      PT LONG TERM GOAL #3   Title Patient will report being able to complete her critical job demands with no more than 4/10 low back pain.    Time 6    Period Weeks    Status New    Target Date 09/24/21                   Plan - 08/27/21 1656     Clinical Impression Statement Patient presented to treatment reporting increased low back and right lower extremity pain. Today's interventions were able to moderately reduce her familiar symptoms with manual therapy being the most effective at reducing her symptoms. She reported that her back felt better (3/10) upon the conclusion of treatment. She continues to require skilled physical therapy to address her remaining impairments to return to her prior level of function.    Personal Factors and Comorbidities Comorbidity 1;Time since onset of injury/illness/exacerbation    Comorbidities HTN    Examination-Activity Limitations Locomotion Level;Transfers;Sit;Stand    Examination-Participation Restrictions Occupation;Cleaning;Community Activity;Shop     Rehab Potential Good    PT Frequency 2x / week    PT Duration 6 weeks    PT Treatment/Interventions Electrical Stimulation;Moist Heat;Iontophoresis 4mg /ml Dexamethasone;Stair training;Functional mobility training;Therapeutic activities;Therapeutic exercise;Neuromuscular re-education;Manual techniques;Patient/family education    PT Next Visit Plan nustep, STM, core stabilization, modalities as needed    Consulted and Agree with Plan of Care Patient             Patient will benefit from skilled therapeutic intervention in order to improve the following deficits and impairments:  Decreased range of motion, Decreased activity tolerance, Pain, Decreased strength  Visit Diagnosis: Radiculopathy, lumbar region     Problem List Patient Active Problem List   Diagnosis Date Noted   Degenerative arthritis of knee, bilateral 01/31/2014   DYSPEPSIA 03/28/2009   ABDOMINAL TENDERNESS OTHER SPECIFIED SITE 03/28/2009   HELICOBACTER PYLORI GASTRITIS 03/27/2009   GERD 03/27/2009   HEARTBURN 03/27/2009    03/29/2009, PT 08/27/2021, 5:48 PM  [facility] [address] [place], [place], [place] Phone: [phone]   Fax:  [phone]  Name: JODA BRAATZ MRN: [name] Date of Birth: Oct 01, 1960

## 2021-09-03 ENCOUNTER — Ambulatory Visit: Payer: BC Managed Care – PPO | Admitting: Physical Therapy

## 2021-09-03 ENCOUNTER — Other Ambulatory Visit: Payer: Self-pay

## 2021-09-03 ENCOUNTER — Encounter: Payer: Self-pay | Admitting: Physical Therapy

## 2021-09-03 DIAGNOSIS — M5416 Radiculopathy, lumbar region: Secondary | ICD-10-CM

## 2021-09-03 NOTE — Therapy (Signed)
[facility] [address] [place], [place], [address] Phone: [phone]   Fax:  [phone]  Physical Therapy Treatment  Patient Details  Name: Bonnie Martin MRN: [id number] Date of Birth: [birth year] Referring Provider (PT): Junious Silk Date: 09/03/2021   PT End of Session - 09/03/21 1124     Visit Number 4    Number of Visits 12    Date for PT Re-Evaluation 11/01/21    PT Start Time 1124    PT Stop Time 1206    PT Time Calculation (min) 42 min    Activity Tolerance Patient tolerated treatment well    Behavior During Therapy [facility] for tasks assessed/performed             Past Medical History:  Diagnosis Date   Hypertension    Thyroid disease     Past Surgical History:  Procedure Laterality Date   ABDOMINAL HYSTERECTOMY     BREAST EXCISIONAL BIOPSY Left    THYROIDECTOMY, PARTIAL Left     There were no vitals filed for this visit.   Subjective Assessment - 09/03/21 1123     Subjective Patient reports that her back is still hurting today, but her right leg is bothering her the most right now. Has history of R knee pain.    Pertinent History R TKA, HTN, OA    Limitations Walking;Standing    How long can you sit comfortably? about 1 hour (driving)    How long can you stand comfortably? about 20 minutes    How long can you walk comfortably? about 20 minutes    Patient Stated Goals be able to go shopping without having to stop,    Currently in Pain? Yes    Pain Score 5     Pain Location Back    Pain Orientation Lower    Pain Descriptors / Indicators Throbbing    Pain Type Acute pain    Pain Radiating Towards RLE    Pain Onset More than a month ago    Pain Frequency Constant                [facility] PT Assessment - 09/03/21 0001       Assessment   Medical Diagnosis Lumbar radiculopathy    Referring Provider (PT) Sherryll Burger    Onset Date/Surgical Date 06/10/21    Next MD Visit 09/2021    Prior Therapy  No      Precautions   Precautions None      Restrictions   Weight Bearing Restrictions No                           [facility] Adult PT Treatment/Exercise - 09/03/21 0001       Lumbar Exercises: Stretches   Passive Hamstring Stretch Right;4 reps;20 seconds    Figure 4 Stretch Other (comment)   unable due to R knee pain   Other Lumbar Stretch Exercise Rockerboard x3 min      Lumbar Exercises: Standing   Other Standing Lumbar Exercises modified bird dog x15 reps 5 sec holds      Modalities   Modalities Electrical Stimulation;Moist Heat      Moist Heat Therapy   Number Minutes Moist Heat 10 Minutes    Moist Heat Location Lumbar Spine      Electrical Stimulation   Electrical Stimulation Location R low back    Electrical Stimulation Action Pre-mod    Electrical Stimulation Parameters 80-150  hz x10 min    Electrical Stimulation Goals Pain      Manual Therapy   Manual Therapy Soft tissue mobilization    Soft tissue mobilization R lumbar paraspinals, QL to reduce tone and pain                          PT Long Term Goals - 08/13/21 1615       PT LONG TERM GOAL #1   Title Patient will be independent with her HEP.    Time 6    Period Weeks    Status New    Target Date 09/24/21      PT LONG TERM GOAL #2   Title Patient will be able to stand and walk for at least 40 minutes without being limited by her familiar low back pain.    Baseline 20 minutes    Time 6    Period Weeks    Status New    Target Date 09/24/21      PT LONG TERM GOAL #3   Title Patient will report being able to complete her critical job demands with no more than 4/10 low back pain.    Time 6    Period Weeks    Status New    Target Date 09/24/21                   Plan - 09/03/21 1216     Clinical Impression Statement Patient presented in clinic with reports of continued R LBP and R knee pain as well. Patient progressed to light lumbar stabilization and  stretching but no increased pain other than figure 4 stretching due to R knee pain. Patient did have moderate tone of R QL region. Normal modalities response noted following removal of the modalities.    Personal Factors and Comorbidities Comorbidity 1;Time since onset of injury/illness/exacerbation    Comorbidities HTN    Examination-Activity Limitations Locomotion Level;Transfers;Sit;Stand    Examination-Participation Restrictions Occupation;Cleaning;Community Activity;Shop    Rehab Potential Good    PT Frequency 2x / week    PT Duration 6 weeks    PT Treatment/Interventions Electrical Stimulation;Moist Heat;Iontophoresis 4mg /ml Dexamethasone;Stair training;Functional mobility training;Therapeutic activities;Therapeutic exercise;Neuromuscular re-education;Manual techniques;Patient/family education    PT Next Visit Plan nustep, STM, core stabilization, modalities as needed    Consulted and Agree with Plan of Care Patient             Patient will benefit from skilled therapeutic intervention in order to improve the following deficits and impairments:  Decreased range of motion, Decreased activity tolerance, Pain, Decreased strength  Visit Diagnosis: Radiculopathy, lumbar region     Problem List Patient Active Problem List   Diagnosis Date Noted   Degenerative arthritis of knee, bilateral 01/31/2014   DYSPEPSIA 03/28/2009   ABDOMINAL TENDERNESS OTHER SPECIFIED SITE 03/28/2009   HELICOBACTER PYLORI GASTRITIS 03/27/2009   GERD 03/27/2009   HEARTBURN 03/27/2009    03/29/2009, PTA 09/03/2021, 12:20 PM  [facility] [address] [place], [place], [place] Phone: [phone]   Fax:  [phone]  Name: Bonnie Martin MRN: [name] Date of Birth: 26-May-1961

## 2021-09-11 ENCOUNTER — Ambulatory Visit: Payer: BC Managed Care – PPO | Admitting: Physical Therapy

## 2021-09-19 ENCOUNTER — Ambulatory Visit: Payer: BC Managed Care – PPO

## 2021-09-20 ENCOUNTER — Ambulatory Visit: Payer: BC Managed Care – PPO | Attending: Pain Medicine

## 2021-09-20 ENCOUNTER — Other Ambulatory Visit: Payer: Self-pay

## 2021-09-20 DIAGNOSIS — M5416 Radiculopathy, lumbar region: Secondary | ICD-10-CM | POA: Insufficient documentation

## 2021-09-20 NOTE — Therapy (Signed)
[facility] [address] [place], [place], [address] Phone: [phone]   Fax:  [phone]  Physical Therapy Treatment  Patient Details  Name: Bonnie Martin MRN: [id number] Date of Birth: February 23, 1961 Referring Provider (PT): Junious Silk Date: 09/20/2021   PT End of Session - 09/20/21 0740     Visit Number 5    Number of Visits 12    Date for PT Re-Evaluation 11/01/21    PT Start Time 0737    PT Stop Time 0806    PT Time Calculation (min) 29 min    Activity Tolerance Patient tolerated treatment well    Behavior During Therapy [facility] for tasks assessed/performed             Past Medical History:  Diagnosis Date   Hypertension    Thyroid disease     Past Surgical History:  Procedure Laterality Date   ABDOMINAL HYSTERECTOMY     BREAST EXCISIONAL BIOPSY Left    THYROIDECTOMY, PARTIAL Left     There were no vitals filed for this visit.   Subjective Assessment - 09/20/21 0738     Subjective Patient reports that she feels alright this morning. She notes that her back has been about the same sinnce her last appointment. She notes that her pain is currently going down into her right knee.    Pertinent History R TKA, HTN, OA    Limitations Walking;Standing    How long can you sit comfortably? about 1 hour (driving)    How long can you stand comfortably? about 20 minutes    How long can you walk comfortably? about 20 minutes    Patient Stated Goals be able to go shopping without having to stop,    Currently in Pain? Yes    Pain Score 5     Pain Location Back    Pain Type Chronic pain    Pain Onset More than a month ago                               [facility] Adult PT Treatment/Exercise - 09/20/21 0001       Lumbar Exercises: Aerobic   Nustep L3 x 12 minutes      Modalities   Modalities Electrical Stimulation;Moist Heat      Moist Heat Therapy   Number Minutes Moist Heat 10 Minutes     Moist Heat Location Lumbar Spine      Electrical Stimulation   Electrical Stimulation Location R low back    Electrical Stimulation Action pre mod    Electrical Stimulation Parameters 80-150 Hz x 10 minutes    Electrical Stimulation Goals Pain                          PT Long Term Goals - 08/13/21 1615       PT LONG TERM GOAL #1   Title Patient will be independent with her HEP.    Time 6    Period Weeks    Status New    Target Date 09/24/21      PT LONG TERM GOAL #2   Title Patient will be able to stand and walk for at least 40 minutes without being limited by her familiar low back pain.    Baseline 20 minutes    Time 6    Period Weeks    Status New  Target Date 09/24/21      PT LONG TERM GOAL #3   Title Patient will report being able to complete her critical job demands with no more than 4/10 low back pain.    Time 6    Period Weeks    Status New    Target Date 09/24/21                   Plan - 09/20/21 0740     Clinical Impression Statement Patient presented to treatment today late and reported then reported after completing the nustep that she needed to leave early due to other errands prior to returning to work. This resulted in treatment focusing on the nustep for light lumbar and lower extremity mobility and electrical stimulation with moist heat for reduced pain and discomfort. She reported that her back felt a little better upon the conclusion of treatment. She continues to require skilled physical therapy to address her remaining impairments to maximize her functional mobility.    Personal Factors and Comorbidities Comorbidity 1;Time since onset of injury/illness/exacerbation    Comorbidities HTN    Examination-Activity Limitations Locomotion Level;Transfers;Sit;Stand    Examination-Participation Restrictions Occupation;Cleaning;Community Activity;Shop    Rehab Potential Good    PT Frequency 2x / week    PT Duration 6 weeks    PT  Treatment/Interventions Electrical Stimulation;Moist Heat;Iontophoresis 4mg /ml Dexamethasone;Stair training;Functional mobility training;Therapeutic activities;Therapeutic exercise;Neuromuscular re-education;Manual techniques;Patient/family education    PT Next Visit Plan nustep, STM, core stabilization, modalities as needed    Consulted and Agree with Plan of Care Patient             Patient will benefit from skilled therapeutic intervention in order to improve the following deficits and impairments:  Decreased range of motion, Decreased activity tolerance, Pain, Decreased strength  Visit Diagnosis: Radiculopathy, lumbar region     Problem List Patient Active Problem List   Diagnosis Date Noted   Degenerative arthritis of knee, bilateral 01/31/2014   DYSPEPSIA 03/28/2009   ABDOMINAL TENDERNESS OTHER SPECIFIED SITE 03/28/2009   HELICOBACTER PYLORI GASTRITIS 03/27/2009   GERD 03/27/2009   HEARTBURN 03/27/2009    03/29/2009, PT 09/20/2021, 8:14 AM  [facility] [address] [place], [place], [place] Phone: [phone]   Fax:  [phone]  Name: NIARA BUNKER MRN: [name] Date of Birth: 17-Mar-1961

## 2021-09-24 ENCOUNTER — Ambulatory Visit: Payer: BC Managed Care – PPO

## 2021-09-26 ENCOUNTER — Ambulatory Visit: Payer: BC Managed Care – PPO

## 2021-09-26 DIAGNOSIS — M5416 Radiculopathy, lumbar region: Secondary | ICD-10-CM | POA: Diagnosis not present

## 2021-09-26 NOTE — Therapy (Addendum)
[facility] [place], [place], [address] Phone: [phone]   Fax:  [phone]  Physical Therapy Treatment  Patient Details  Name: ANGELIZE RYCE MRN: [id number] Date of Birth: 1961/09/08 Referring Provider (PT): Vertell Novak Date: 09/26/2021   PT End of Session - 09/26/21 1648     Visit Number 6    Number of Visits 12    Date for PT Re-Evaluation 11/01/21    PT Start Time 2111    PT Stop Time 1730    PT Time Calculation (min) 43 min    Activity Tolerance Patient tolerated treatment well    Behavior During Therapy [facility] for tasks assessed/performed             Past Medical History:  Diagnosis Date   Hypertension    Thyroid disease     Past Surgical History:  Procedure Laterality Date   ABDOMINAL HYSTERECTOMY     BREAST EXCISIONAL BIOPSY Left    THYROIDECTOMY, PARTIAL Left     There were no vitals filed for this visit.   Subjective Assessment - 09/26/21 1649     Subjective Patient reports that her back feels alright today. However, she feels the same pain radiating to her right knee. She notes that her last appointment helped her back for about 2-3 days.    Pertinent History R TKA, HTN, OA    Limitations Walking;Standing    How long can you sit comfortably? about 1 hour (driving)    How long can you stand comfortably? about 20 minutes    How long can you walk comfortably? about 20 minutes    Patient Stated Goals be able to go shopping without having to stop,    Currently in Pain? Yes    Pain Score 6     Pain Location Back    Pain Orientation Lower    Pain Type Chronic pain    Pain Radiating Towards anterior right knee    Pain Onset More than a month ago                               [facility] Adult PT Treatment/Exercise - 09/26/21 0001       Lumbar Exercises: Stretches   Passive Hamstring Stretch Right;Left;4 reps;30 seconds      Lumbar Exercises: Aerobic   Nustep  L3 x 8.5 minutes   limited by pulling in low back     Lumbar Exercises: Seated   Other Seated Lumbar Exercises Slouch/overcorrect   30 reps     Lumbar Exercises: Supine   Large Ball Abdominal Isometric 5 seconds   2 minutes; standing     Knee/Hip Exercises: Seated   Long Arc Quad Both;20 reps      Modalities   Modalities Electrical Stimulation;Moist Heat      Moist Heat Therapy   Number Minutes Moist Heat 15 Minutes    Moist Heat Location Lumbar Spine      Electrical Stimulation   Electrical Stimulation Location R low back    Electrical Stimulation Action IFC    Electrical Stimulation Parameters 80-150 Hz w/ 40% scan x 15 minutes    Electrical Stimulation Goals Pain                          PT Long Term Goals - 08/13/21 1615       PT LONG TERM GOAL #1  Title Patient will be independent with her HEP.    Time 6    Period Weeks    Status New    Target Date 09/24/21      PT LONG TERM GOAL #2   Title Patient will be able to stand and walk for at least 40 minutes without being limited by her familiar low back pain.    Baseline 20 minutes    Time 6    Period Weeks    Status New    Target Date 09/24/21      PT LONG TERM GOAL #3   Title Patient will report being able to complete her critical job demands with no more than 4/10 low back pain.    Time 6    Period Weeks    Status New    Target Date 09/24/21                   Plan - 09/26/21 1716     Clinical Impression Statement Patient was introduced to seated slouch overcorrect in addition to familiar interventions. She required minimal cuing with this activity for a neutral lumbar spine to facilitate improved sitting posture for her job demands. Electrical stimulation was utilized at the conclusion of today's interventions. She reported that she felt good upon the conclusion of treatment. She continues to require skilled physical therapy to address her remaining impairments to return to her prior  level of function.    Personal Factors and Comorbidities Comorbidity 1;Time since onset of injury/illness/exacerbation    Comorbidities HTN    Examination-Activity Limitations Locomotion Level;Transfers;Sit;Stand    Examination-Participation Restrictions Occupation;Cleaning;Community Activity;Shop    Rehab Potential Good    PT Frequency 2x / week    PT Duration 6 weeks    PT Treatment/Interventions Electrical Stimulation;Moist Heat;Iontophoresis 4m/ml Dexamethasone;Stair training;Functional mobility training;Therapeutic activities;Therapeutic exercise;Neuromuscular re-education;Manual techniques;Patient/family education    PT Next Visit Plan nustep, STM, core stabilization, modalities as needed    Consulted and Agree with Plan of Care Patient             Patient will benefit from skilled therapeutic intervention in order to improve the following deficits and impairments:  Decreased range of motion, Decreased activity tolerance, Pain, Decreased strength  Visit Diagnosis: Radiculopathy, lumbar region     Problem List Patient Active Problem List   Diagnosis Date Noted   Degenerative arthritis of knee, bilateral 01/31/2014   DYSPEPSIA 03/28/2009   ABDOMINAL TENDERNESS OTHER SPECIFIED SITE 008/67/6195  HELICOBACTER PYLORI GASTRITIS 03/27/2009   GERD 03/27/2009   HEARTBURN 03/27/2009    JDarlin Coco PT 09/26/2021, 5:39 PM  [facility] [address] [address]: [phone]  Fax:  [phone] Name: MDESANI SPRUNGMRN: [id number] of Birth: 105-16-1962 PHYSICAL THERAPY DISCHARGE SUMMARY  Visits from Start of Care: 6  Current functional level related to goals / functional outcomes: Patient had yet to meet their goals for physical therapy. She had attended six visits, but has not returned since her last appointment on 09/26/21.   Remaining deficits: Low back pain    Education /  Equipment: HEP    Patient agrees to discharge. Patient goals were not met. Patient is being discharged due to not returning since the last visit.

## 2021-11-05 ENCOUNTER — Encounter: Payer: Self-pay | Admitting: Physical Therapy

## 2021-11-05 ENCOUNTER — Other Ambulatory Visit: Payer: Self-pay

## 2021-11-05 ENCOUNTER — Ambulatory Visit
Payer: BC Managed Care – PPO | Attending: Student in an Organized Health Care Education/Training Program | Admitting: Physical Therapy

## 2021-11-05 DIAGNOSIS — M545 Low back pain, unspecified: Secondary | ICD-10-CM | POA: Diagnosis not present

## 2021-11-05 DIAGNOSIS — M5416 Radiculopathy, lumbar region: Secondary | ICD-10-CM | POA: Diagnosis present
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
## 2021-11-12 ENCOUNTER — Ambulatory Visit: Payer: BC Managed Care – PPO | Admitting: *Deleted

## 2021-11-12 DIAGNOSIS — M545 Low back pain, unspecified: Secondary | ICD-10-CM

## 2021-11-12 DIAGNOSIS — M5416 Radiculopathy, lumbar region: Secondary | ICD-10-CM
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
## 2021-11-14 ENCOUNTER — Encounter: Payer: BC Managed Care – PPO | Admitting: *Deleted

## 2021-11-19 ENCOUNTER — Other Ambulatory Visit: Payer: Self-pay

## 2021-11-19 ENCOUNTER — Ambulatory Visit: Payer: BC Managed Care – PPO | Attending: Student in an Organized Health Care Education/Training Program

## 2021-11-19 DIAGNOSIS — M5416 Radiculopathy, lumbar region: Secondary | ICD-10-CM | POA: Insufficient documentation

## 2021-11-19 DIAGNOSIS — M545 Low back pain, unspecified: Secondary | ICD-10-CM | POA: Insufficient documentation

## 2021-11-19 NOTE — Therapy (Signed)
[facility] ?[address] ?[place], [place], [address] ?Phone: [phone]   Fax:  [phone] ? ?Physical Therapy Treatment ? ?Patient Details  ?Name: Bonnie Martin ?MRN: [id number] ?Date of Birth: 1961/06/08 ?Referring Provider (PT): Harrel Carina DO ? ? ?Encounter Date: 11/19/2021 ? ? PT End of Session - 11/19/21 1649   ? ? Visit Number 3   ? Number of Visits 12   ? Date for PT Re-Evaluation 12/17/21   ? PT Start Time [id number]   ? PT Stop Time [id number]   ? PT Time Calculation (min) 46 min   ? Activity Tolerance Patient tolerated treatment well   ? Behavior During Therapy [facility] for tasks assessed/performed   ? ?  ?  ? ?  ? ? ?Past Medical History:  ?Diagnosis Date  ? Hypertension   ? Thyroid disease   ? ? ?Past Surgical History:  ?Procedure Laterality Date  ? ABDOMINAL HYSTERECTOMY    ? BREAST EXCISIONAL BIOPSY Left   ? THYROIDECTOMY, PARTIAL Left   ? ? ?There were no vitals filed for this visit. ? ? Subjective Assessment - 11/19/21 1648   ? ? Subjective Patient reports that her back is still hurting today.   ? Pertinent History H/o of right knee pain, HTN, Thyroid disease.   ? Limitations Walking;Standing   ? How long can you stand comfortably? about 20 minutes   ? How long can you walk comfortably? about 20 minutes   ? Patient Stated Goals Do more with less pain, avoid surgery.   ? Currently in Pain? Yes   ? Pain Score 6    ? Pain Location Back   ? Pain Onset More than a month ago   ? ?  ?  ? ?  ? ? ? ? ? ? ? ? ? ? ? ? ? ? ? ? ? ? ? ? [facility] Adult PT Treatment/Exercise - 11/19/21 0001   ? ?  ? Lumbar Exercises: Aerobic  ? Nustep L3 x 15 minutes   ?  ? Modalities  ? Modalities Electrical Stimulation   ?  ? Electrical Stimulation  ? Electrical Stimulation Location Bilateral LB   ? Electrical Stimulation Action IFC x 15 minutes   ? Electrical Stimulation Parameters 80-150 Hz w/ 40% scan   ? Electrical Stimulation Goals Pain;Tone   ?  ? Manual Therapy  ? Manual Therapy Soft tissue  mobilization   ? Soft tissue mobilization bilateral lumbar paraspinals   ? ?  ?  ? ?  ? ? ? ? ? ? ? ? ? ? ? ? ? ? ? PT Long Term Goals - 11/05/21 1622   ? ?  ? PT LONG TERM GOAL #1  ? Title Patient will be independent with her HEP.   ? Time 6   ? Period Weeks   ? Status New   ?  ? PT LONG TERM GOAL #2  ? Title Patient will be able to stand and walk for at least 40 minutes without being limited by her familiar low back pain.   ? Time 6   ? Period Weeks   ? Status New   ?  ? PT LONG TERM GOAL #3  ? Title Sleep undisturbed 6 hours.   ? Time 6   ? Period Weeks   ? Status New   ?  ? PT LONG TERM GOAL #4  ? Title Walk a community distance with pain not > 3/10.   ? Time  6   ? Period Weeks   ? Status New   ? ?  ?  ? ?  ? ? ? ? ? ? ? ? Plan - 11/19/21 1649   ? ? Clinical Impression Statement Patient was introduced to the nustep for improved lower extremity endurance. Manual therapy focused on soft tissue mobilization to the lumbar paraspinals as this was able to slightly reduce her familiar symptoms. This was followed by electrical stimulation to the are which was moderate effectiveness. She reported feeling better upon the conclusion of treatment. She continues to require skilled physical therapy to address her remaining impairments to maximize her functional mobility.   ? Personal Factors and Comorbidities Comorbidity 1;Time since onset of injury/illness/exacerbation   ? Comorbidities H/o of right knee pain, HTN, Thyroid disease.   ? Examination-Participation Restrictions Occupation;Cleaning;Community Activity;Shop   ? Rehab Potential Good   ? PT Frequency 2x / week   ? PT Duration 6 weeks   ? PT Treatment/Interventions Electrical Stimulation;Moist Heat;Therapeutic activities;Therapeutic exercise;Manual techniques;Patient/family education;Cryotherapy   ? PT Next Visit Plan Combo e'stim/US, STW/M, core exercise progression.   ? Consulted and Agree with Plan of Care Patient   ? ?  ?  ? ?  ? ? ?Patient will benefit from  skilled therapeutic intervention in order to improve the following deficits and impairments:  Decreased range of motion, Decreased activity tolerance, Pain, Increased muscle spasms ? ?Visit Diagnosis: ?Acute bilateral low back pain without sciatica ? ? ? ? ?Problem List ?Patient Active Problem List  ? Diagnosis Date Noted  ? Degenerative arthritis of knee, bilateral 01/31/2014  ? DYSPEPSIA 03/28/2009  ? ABDOMINAL TENDERNESS OTHER SPECIFIED SITE 03/28/2009  ? HELICOBACTER PYLORI GASTRITIS 03/27/2009  ? GERD 03/27/2009  ? HEARTBURN 03/27/2009  ? ? ?Darlin Coco, PT ?11/19/2021, 5:38 PM ? ?[facility] [facility] ?[place] ?[place], [place], [address] ?Phone: [phone]   Fax:  [phone] ? ?Name: Bonnie Martin ?MRN: [id number] ?Date of Birth: 05/19/1961 ? ? ? ?

## 2021-11-28 ENCOUNTER — Ambulatory Visit: Payer: BC Managed Care – PPO | Admitting: *Deleted

## 2021-12-03 ENCOUNTER — Other Ambulatory Visit: Payer: Self-pay

## 2021-12-03 ENCOUNTER — Ambulatory Visit: Payer: BC Managed Care – PPO | Admitting: Physical Therapy

## 2021-12-03 DIAGNOSIS — M545 Low back pain, unspecified: Secondary | ICD-10-CM | POA: Diagnosis not present

## 2021-12-03 DIAGNOSIS — M5416 Radiculopathy, lumbar region: Secondary | ICD-10-CM

## 2021-12-03 NOTE — Therapy (Signed)
[place] ?[facility] ?[address] ?[place], [place], [address] ?Phone: [phone]   Fax:  [phone] ? ?Physical Therapy Treatment ? ?Patient Details  ?Name: Bonnie Martin ?MRN: [id number] ?Date of Birth: [birth year] ?Referring Provider (PT): Bernerd Pho DO ? ? ?Encounter Date: 12/03/2021 ? ? PT End of Session - 12/03/21 1721   ? ? Visit Number 4   ? Number of Visits 12   ? Date for PT Re-Evaluation 12/17/21   ? PT Start Time [phone]   ? PT Stop Time 0539   ? PT Time Calculation (min) 56 min   ? Activity Tolerance Patient tolerated treatment well   ? Behavior During Therapy [facility] for tasks assessed/performed   ? ?  ?  ? ?  ? ? ?Past Medical History:  ?Diagnosis Date  ? Hypertension   ? Thyroid disease   ? ? ?Past Surgical History:  ?Procedure Laterality Date  ? ABDOMINAL HYSTERECTOMY    ? BREAST EXCISIONAL BIOPSY Left   ? THYROIDECTOMY, PARTIAL Left   ? ? ?There were no vitals filed for this visit. ? ? Subjective Assessment - 12/03/21 1721   ? ? Subjective The patient was doing sonme housework and felt was was likely a muscle spasm in her left low back region.   ? Pertinent History H/o of right knee pain, HTN, Thyroid disease.   ? Limitations Walking;Standing   ? How long can you stand comfortably? about 20 minutes   ? How long can you walk comfortably? about 20 minutes   ? Patient Stated Goals Do more with less pain, avoid surgery.   ? Currently in Pain? Yes   ? Pain Score 6    ? Pain Location Back   ? Pain Orientation Right;Left   ? Pain Descriptors / Indicators Aching;Sore;Spasm   ? Pain Type Chronic pain   ? Pain Onset More than a month ago   ? ?  ?  ? ?  ? ? ? ? ? ? ? ? ? ? ? ? ? ? ? ? ? ? ? ? [facility] Adult PT Treatment/Exercise - 12/03/21 0001   ? ?  ? Electrical Stimulation  ? Electrical Stimulation Location Bilateral low back   ? Electrical Stimulation Action IFC at 80-150 Hz.   ? Electrical Stimulation Parameters 40% scan x 20 minutes.   ? Electrical Stimulation Goals  Pain;Tone   ?  ? Ultrasound  ? Ultrasound Location Bilateral lumbar region.   ? Ultrasound Parameters Seated with arms resting on pillows on plinth:  Combo e'stim/US at 1.50 W/CM2 x12 minutes.   ?  ? Manual Therapy  ? Manual Therapy Soft tissue mobilization   ? Soft tissue mobilization STW/M x 12 minutes to patient's bilateral lumbar musculature especially on the left.  Also performed left QL release technique utilizing ischemic release technique.   ? ?  ?  ? ?  ? ? ? ? ? ? ? ? ? ? ? ? ? ? ? PT Long Term Goals - 11/05/21 1622   ? ?  ? PT LONG TERM GOAL #1  ? Title Patient will be independent with her HEP.   ? Time 6   ? Period Weeks   ? Status New   ?  ? PT LONG TERM GOAL #2  ? Title Patient will be able to stand and walk for at least 40 minutes without being limited by her familiar low back pain.   ? Time 6   ? Period Weeks   ?  Status New   ?  ? PT LONG TERM GOAL #3  ? Title Sleep undisturbed 6 hours.   ? Time 6   ? Period Weeks   ? Status New   ?  ? PT LONG TERM GOAL #4  ? Title Walk a community distance with pain not > 3/10.   ? Time 6   ? Period Weeks   ? Status New   ? ?  ?  ? ?  ? ? ? ? ? ? ? ? Plan - 12/03/21 1730   ? ? Clinical Impression Statement Patient had notable tone in her lumbar musculature today, especially her left QL.  Very good response to soft tissue technique today and felt better following.  Normal modality response following removal of modality.   ? Personal Factors and Comorbidities Comorbidity 1;Time since onset of injury/illness/exacerbation   ? Comorbidities H/o of right knee pain, HTN, Thyroid disease.   ? Examination-Activity Limitations Locomotion Level;Transfers;Sit;Stand   ? Examination-Participation Restrictions Occupation;Cleaning;Community Activity;Shop   ? Rehab Potential Good   ? PT Frequency 2x / week   ? PT Duration 6 weeks   ? PT Treatment/Interventions Electrical Stimulation;Moist Heat;Therapeutic activities;Therapeutic exercise;Manual techniques;Patient/family  education;Cryotherapy;Ultrasound   ? PT Next Visit Plan Combo e'stim/US, STW/M, core exercise progression.   ? Consulted and Agree with Plan of Care Patient   ? ?  ?  ? ?  ? ? ?Patient will benefit from skilled therapeutic intervention in order to improve the following deficits and impairments:  Decreased range of motion, Decreased activity tolerance, Pain, Increased muscle spasms ? ?Visit Diagnosis: ?Acute bilateral low back pain without sciatica ? ?Radiculopathy, lumbar region ? ? ? ? ?Problem List ?Patient Active Problem List  ? Diagnosis Date Noted  ? Degenerative arthritis of knee, bilateral 01/31/2014  ? DYSPEPSIA 03/28/2009  ? ABDOMINAL TENDERNESS OTHER SPECIFIED SITE 03/28/2009  ? HELICOBACTER PYLORI GASTRITIS 03/27/2009  ? GERD 03/27/2009  ? HEARTBURN 03/27/2009  ? ? ?Zarek Relph, [place], PT ?12/03/2021, 5:41 PM ? ?[place] ?[facility] ?[address] ?[place], [place], [address] ?Phone: [phone]   Fax:  [phone] ? ?Name: Bonnie Martin ?MRN: [id number] ?Date of Birth: May 23, 1961 ? ? ? ?

## 2021-12-05 ENCOUNTER — Ambulatory Visit: Payer: BC Managed Care – PPO | Admitting: Physical Therapy

## 2021-12-05 ENCOUNTER — Other Ambulatory Visit: Payer: Self-pay

## 2021-12-05 DIAGNOSIS — M545 Low back pain, unspecified: Secondary | ICD-10-CM | POA: Diagnosis not present

## 2021-12-05 DIAGNOSIS — M5416 Radiculopathy, lumbar region: Secondary | ICD-10-CM

## 2021-12-05 NOTE — Therapy (Signed)
[place] ?[facility] ?[address] ?[place], [place], [address] ?Phone: [phone]   Fax:  [phone] ? ?Physical Therapy Treatment ? ?Patient Details  ?Name: Bonnie Martin ?MRN: [id number] ?Date of Birth: Apr 02, 1961 ?Referring Provider (PT): Harrel Carina DO ? ? ?Encounter Date: 12/05/2021 ? ? PT End of Session - 12/05/21 1719   ? ? Visit Number 5   ? Number of Visits 12   ? Date for PT Re-Evaluation 12/17/21   ? PT Start Time [phone]   ? PT Stop Time 0536   ? PT Time Calculation (min) 55 min   ? Activity Tolerance Patient tolerated treatment well   ? Behavior During Therapy [facility] for tasks assessed/performed   ? ?  ?  ? ?  ? ? ?Past Medical History:  ?Diagnosis Date  ? Hypertension   ? Thyroid disease   ? ? ?Past Surgical History:  ?Procedure Laterality Date  ? ABDOMINAL HYSTERECTOMY    ? BREAST EXCISIONAL BIOPSY Left   ? THYROIDECTOMY, PARTIAL Left   ? ? ?There were no vitals filed for this visit. ? ? Subjective Assessment - 12/05/21 1719   ? ? Subjective The patient said the last treatment helped a lot.  Unfortunately, she tripped on one of her stepping stones at home and fell and now has increased righ knee wrist and hip pain.  She had a noticeable limp today.  She will be seeing her doctor.   ? Pertinent History H/o of right knee pain, HTN, Thyroid disease.   ? Limitations Walking;Standing   ? How long can you stand comfortably? about 20 minutes   ? How long can you walk comfortably? about 20 minutes   ? Patient Stated Goals Do more with less pain, avoid surgery.   ? Currently in Pain? Yes   ? Pain Score 7    ? Pain Location Back   ? Pain Orientation Right;Left   ? Pain Descriptors / Indicators Aching;Sore;Spasm   ? Pain Type Chronic pain   ? Pain Onset More than a month ago   ? ?  ?  ? ?  ? ? ? ? ? ? ? ? ? ? ? ? ? ? ? ? ? ? ? ? [place] Adult PT Treatment/Exercise - 12/05/21 0001   ? ?  ? Modalities  ? Modalities Electrical Stimulation;Ultrasound   ?  ? Electrical Stimulation   ? Electrical Stimulation Location Bilateral low back   ? Electrical Stimulation Action IFC at 80-150 Hz.   ? Electrical Stimulation Parameters 40% scan x 20 minutes.   ? Electrical Stimulation Goals Pain;Tone   ?  ? Ultrasound  ? Ultrasound Location Bilateral lumbar musculature.   ? Ultrasound Parameters Combo e'stim/US at 1.50 W/CM2 x 12 minutes.   ? Ultrasound Goals Pain   ?  ? Manual Therapy  ? Manual Therapy Soft tissue mobilization   ? Soft tissue mobilization STW/M x 12 minutes   ? ?  ?  ? ?  ? ? ? ? ? ? ? ? ? ? ? ? ? ? ? PT Long Term Goals - 11/05/21 1622   ? ?  ? PT LONG TERM GOAL #1  ? Title Patient will be independent with her HEP.   ? Time 6   ? Period Weeks   ? Status New   ?  ? PT LONG TERM GOAL #2  ? Title Patient will be able to stand and walk for at least 40 minutes without being limited by  her familiar low back pain.   ? Time 6   ? Period Weeks   ? Status New   ?  ? PT LONG TERM GOAL #3  ? Title Sleep undisturbed 6 hours.   ? Time 6   ? Period Weeks   ? Status New   ?  ? PT LONG TERM GOAL #4  ? Title Walk a community distance with pain not > 3/10.   ? Time 6   ? Period Weeks   ? Status New   ? ?  ?  ? ?  ? ? ? ? ? ? ? ? Plan - 12/05/21 1732   ? ? Clinical Impression Statement Patient had a good response to her last treatment but unfortunately tripped and fell.  She continues to have palpable tenderness in her bilateral lumbar musculature but is responding well to soft tissue work.   ? Personal Factors and Comorbidities Comorbidity 1;Time since onset of injury/illness/exacerbation   ? Comorbidities H/o of right knee pain, HTN, Thyroid disease.   ? Examination-Activity Limitations Locomotion Level;Transfers;Sit;Stand   ? Examination-Participation Restrictions Occupation;Cleaning;Community Activity;Shop   ? Rehab Potential Good   ? PT Frequency 2x / week   ? PT Duration 6 weeks   ? PT Treatment/Interventions Electrical Stimulation;Moist Heat;Therapeutic activities;Therapeutic exercise;Manual  techniques;Patient/family education;Cryotherapy;Ultrasound   ? PT Next Visit Plan Combo e'stim/US, STW/M, core exercise progression.   ? Consulted and Agree with Plan of Care Patient   ? ?  ?  ? ?  ? ? ?Patient will benefit from skilled therapeutic intervention in order to improve the following deficits and impairments:  Decreased range of motion, Decreased activity tolerance, Pain, Increased muscle spasms ? ?Visit Diagnosis: ?Acute bilateral low back pain without sciatica ? ?Radiculopathy, lumbar region ? ? ? ? ?Problem List ?Patient Active Problem List  ? Diagnosis Date Noted  ? Degenerative arthritis of knee, bilateral 01/31/2014  ? DYSPEPSIA 03/28/2009  ? ABDOMINAL TENDERNESS OTHER SPECIFIED SITE 03/28/2009  ? HELICOBACTER PYLORI GASTRITIS 03/27/2009  ? GERD 03/27/2009  ? HEARTBURN 03/27/2009  ? ? ?Bonnie Martin, [place], PT ?12/05/2021, 5:37 PM ? ?[place] ?[facility] ?[address] ?[place], [place], [address] ?Phone: [phone]   Fax:  [phone] ? ?Name: Bonnie Martin ?MRN: [id number] ?Date of Birth: Aug 29, 1961 ? ? ? ?

## 2021-12-10 ENCOUNTER — Ambulatory Visit: Payer: BC Managed Care – PPO | Admitting: Physical Therapy

## 2021-12-10 ENCOUNTER — Other Ambulatory Visit: Payer: Self-pay

## 2021-12-10 ENCOUNTER — Encounter: Payer: Self-pay | Admitting: Physical Therapy

## 2021-12-10 DIAGNOSIS — M545 Low back pain, unspecified: Secondary | ICD-10-CM

## 2021-12-10 DIAGNOSIS — M5416 Radiculopathy, lumbar region: Secondary | ICD-10-CM

## 2021-12-10 NOTE — Therapy (Signed)
[place] ?[facility] ?[address] ?[place], [place], [address] ?Phone: [phone]   Fax:  [phone] ? ?Physical Therapy Treatment ? ?Patient Details  ?Name: Bonnie Martin ?MRN: [id number] ?Date of Birth: 06/30/1961 ?Referring Provider (PT): Harrel Carina DO ? ? ?Encounter Date: 12/10/2021 ? ? PT End of Session - 12/10/21 1650   ? ? Visit Number 6   ? Number of Visits 12   ? Date for PT Re-Evaluation 12/17/21   ? PT Start Time [id number]   ? PT Stop Time [id number]   ? PT Time Calculation (min) 42 min   ? Activity Tolerance Patient tolerated treatment well   ? Behavior During Therapy [facility] for tasks assessed/performed   ? ?  ?  ? ?  ? ? ?Past Medical History:  ?Diagnosis Date  ? Hypertension   ? Thyroid disease   ? ? ?Past Surgical History:  ?Procedure Laterality Date  ? ABDOMINAL HYSTERECTOMY    ? BREAST EXCISIONAL BIOPSY Left   ? THYROIDECTOMY, PARTIAL Left   ? ? ?There were no vitals filed for this visit. ? ? Subjective Assessment - 12/10/21 1647   ? ? Subjective reports 6/10 LBP currently.   ? Pertinent History H/o of right knee pain, HTN, Thyroid disease.   ? Limitations Walking;Standing   ? How long can you stand comfortably? about 20 minutes   ? How long can you walk comfortably? about 20 minutes   ? Patient Stated Goals Do more with less pain, avoid surgery.   ? Currently in Pain? Yes   ? Pain Score 6    ? Pain Location Back   ? Pain Orientation Right;Left;Lower   ? Pain Descriptors / Indicators Aching   ? Pain Type Chronic pain   ? Pain Onset More than a month ago   ? Pain Frequency Constant   ? ?  ?  ? ?  ? ? ? ? ? [facility] PT Assessment - 12/10/21 0001   ? ?  ? Assessment  ? 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   ? Referring Provider (PT) Harrel Carina DO   ? Onset Date/Surgical Date 06/10/21   ? Next MD Visit 01/10/2022   ? Prior Therapy Yes   ?  ? Precautions  ? Precautions None   ?  ? Restrictions  ? Weight Bearing Restrictions No   ? ?  ?  ? ?   ? ? ? ? ? ? ? ? ? ? ? ? ? ? ? ? [facility] Adult PT Treatment/Exercise - 12/10/21 0001   ? ?  ? Modalities  ? Modalities Electrical Stimulation;Ultrasound   ?  ? Electrical Stimulation  ? Electrical Stimulation Location B low back   ? Electrical Stimulation Action IFC   ? Electrical Stimulation Parameters 80-150 hz x15 min   ? Electrical Stimulation Goals Pain   ?  ? Ultrasound  ? Ultrasound Location L low back   ? Ultrasound Parameters Combo 1.5 w/cm2, 100%, 1 mhz x10 min   ? Ultrasound Goals Pain   ?  ? Manual Therapy  ? Manual Therapy Soft tissue mobilization   ? Soft tissue mobilization STW to L lumbar paraspinals, QL to reduce pain   ? ?  ?  ? ?  ? ? ? ? ? ? ? ? ? ? ? ? ? ? ? PT Long Term Goals - 12/10/21 1755   ? ?  ? PT LONG TERM GOAL #1  ? Title Patient will be  independent with her HEP.   ? Time 6   ? Period Weeks   ? Status On-going   ?  ? PT LONG TERM GOAL #2  ? Title Patient will be able to stand and walk for at least 40 minutes without being limited by her familiar low back pain.   ? Time 6   ? Period Weeks   ? Status On-going   ?  ? PT LONG TERM GOAL #3  ? Title Sleep undisturbed 6 hours.   ? Time 6   ? Period Weeks   ? Status On-going   ?  ? PT LONG TERM GOAL #4  ? Title Walk a community distance with pain not > 3/10.   ? Time 6   ? Period Weeks   ? Status On-going   ? ?  ?  ? ?  ? ? ? ? ? ? ? ? Plan - 12/10/21 1752   ? ? Clinical Impression Statement Patient presented in clinic with reports of 6/10 LBP which is fairly constant per patient report. Patient indicating that recently pain has been more prevalent and notable in L low back. Patient presented with minimal tone and no complaints of tenderness with manual therapy. Normal modalities response noted following removal of the modalities.   ? Personal Factors and Comorbidities Comorbidity 1;Time since onset of injury/illness/exacerbation   ? Comorbidities H/o of right knee pain, HTN, Thyroid disease.   ? Examination-Activity Limitations Locomotion  Level;Transfers;Sit;Stand   ? Examination-Participation Restrictions Occupation;Cleaning;Community Activity;Shop   ? Rehab Potential Good   ? PT Frequency 2x / week   ? PT Duration 6 weeks   ? PT Treatment/Interventions Electrical Stimulation;Moist Heat;Therapeutic activities;Therapeutic exercise;Manual techniques;Patient/family education;Cryotherapy;Ultrasound   ? PT Next Visit Plan Combo e'stim/US, STW/M, core exercise progression.   ? Consulted and Agree with Plan of Care Patient   ? ?  ?  ? ?  ? ? ?Patient will benefit from skilled therapeutic intervention in order to improve the following deficits and impairments:  Decreased range of motion, Decreased activity tolerance, Pain, Increased muscle spasms ? ?Visit Diagnosis: ?Acute bilateral low back pain without sciatica ? ?Radiculopathy, lumbar region ? ? ? ? ?Problem List ?Patient Active Problem List  ? Diagnosis Date Noted  ? Degenerative arthritis of knee, bilateral 01/31/2014  ? DYSPEPSIA 03/28/2009  ? ABDOMINAL TENDERNESS OTHER SPECIFIED SITE 03/28/2009  ? HELICOBACTER PYLORI GASTRITIS 03/27/2009  ? GERD 03/27/2009  ? HEARTBURN 03/27/2009  ? ? ?Standley Brooking, PTA ?12/10/2021, 5:55 PM ? ?[place] ?[facility] ?[address] ?[place], [place], [address] ?Phone: [phone]   Fax:  [phone] ? ?Name: Bonnie Martin ?MRN: [id number] ?Date of Birth: 22-Dec-1960 ? ? ? ?

## 2021-12-12 ENCOUNTER — Ambulatory Visit: Payer: BC Managed Care – PPO | Admitting: *Deleted

## 2021-12-12 DIAGNOSIS — M545 Low back pain, unspecified: Secondary | ICD-10-CM

## 2021-12-12 DIAGNOSIS — M5416 Radiculopathy, lumbar region: Secondary | ICD-10-CM

## 2021-12-12 NOTE — Therapy (Signed)
[place] ?[facility] ?[address] ?[place], [place], [address] ?Phone: [phone]   Fax:  [phone] ? ?Physical Therapy Treatment ? ?Patient Details  ?Name: Bonnie Martin ?MRN: [id number] ?Date of Birth: 06/08/1961 ?Referring Provider (PT): Bernerd Pho DO ? ? ?Encounter Date: 12/12/2021 ? ? PT End of Session - 12/12/21 1731   ? ? Visit Number 7   ? Number of Visits 12   ? Date for PT Re-Evaluation 12/17/21   ? PT Start Time 1645   ? PT Stop Time 1732   ? PT Time Calculation (min) 47 min   ? ?  ?  ? ?  ? ? ?Past Medical History:  ?Diagnosis Date  ? Hypertension   ? Thyroid disease   ? ? ?Past Surgical History:  ?Procedure Laterality Date  ? ABDOMINAL HYSTERECTOMY    ? BREAST EXCISIONAL BIOPSY Left   ? THYROIDECTOMY, PARTIAL Left   ? ? ?There were no vitals filed for this visit. ? ? Subjective Assessment - 12/12/21 1644   ? ? Subjective reports 6/10 LBP today. Rxs are helping   ? Pertinent History H/o of right knee pain, HTN, Thyroid disease.   ? Limitations Walking;Standing   ? How long can you stand comfortably? about 20 minutes   ? How long can you walk comfortably? about 20 minutes   ? Patient Stated Goals Do more with less pain, avoid surgery.   ? Pain Score 6    ? Pain Location Back   ? Pain Orientation Right;Left   ? Pain Descriptors / Indicators Aching   ? ?  ?  ? ?  ? ? ? ? ? ? ? ? ? ? ? ? ? ? ? ? ? ? ? ? [facility] Adult PT Treatment/Exercise - 12/12/21 0001   ? ?  ? Modalities  ? Modalities Electrical Stimulation;Ultrasound   ?  ? Electrical Stimulation  ? Electrical Stimulation Location B low back   ? Electrical Stimulation Action IFC   ? Electrical Stimulation Parameters 80-150hz  x 15 mins   ? Electrical Stimulation Goals Pain   ?  ? Ultrasound  ? Ultrasound Location Bil LB paras   ? Ultrasound Parameters combo 1.5 w/cm2 x 12 mins   ? Ultrasound Goals Pain   ?  ? Manual Therapy  ? Manual Therapy Soft tissue mobilization   ? Soft tissue mobilization STW to Bil  lumbar  paraspinals, and LT TPR to QL to reduce pain   ? ?  ?  ? ?  ? ? ? ? ? ? ? ? ? ? ? ? ? ? ? PT Long Term Goals - 12/10/21 1755   ? ?  ? PT LONG TERM GOAL #1  ? Title Patient will be independent with her HEP.   ? Time 6   ? Period Weeks   ? Status On-going   ?  ? PT LONG TERM GOAL #2  ? Title Patient will be able to stand and walk for at least 40 minutes without being limited by her familiar low back pain.   ? Time 6   ? Period Weeks   ? Status On-going   ?  ? PT LONG TERM GOAL #3  ? Title Sleep undisturbed 6 hours.   ? Time 6   ? Period Weeks   ? Status On-going   ?  ? PT LONG TERM GOAL #4  ? Title Walk a community distance with pain not > 3/10.   ? Time 6   ?  Period Weeks   ? Status On-going   ? ?  ?  ? ?  ? ? ? ? ? ? ? ? Plan - 12/12/21 1733   ? ? Clinical Impression Statement Pt arrived doing about the same with 6/10 LBP. Rx focused on pain reduction Bil LB paras as well as LT side QL. Pt still with notable tightness in these areas. TPR performed to LT QL with some release. Decreased pain after session.   ? Personal Factors and Comorbidities Comorbidity 1;Time since onset of injury/illness/exacerbation   ? Comorbidities H/o of right knee pain, HTN, Thyroid disease.   ? Examination-Activity Limitations Locomotion Level;Transfers;Sit;Stand   ? Examination-Participation Restrictions Occupation;Cleaning;Community Activity;Shop   ? Rehab Potential Good   ? PT Frequency 2x / week   ? PT Duration 6 weeks   ? PT Treatment/Interventions Electrical Stimulation;Moist Heat;Therapeutic activities;Therapeutic exercise;Manual techniques;Patient/family education;Cryotherapy;Ultrasound   ? PT Next Visit Plan Combo e'stim/US, STW/M, core exercise progression.   ? Consulted and Agree with Plan of Care Patient   ? ?  ?  ? ?  ? ? ?Patient will benefit from skilled therapeutic intervention in order to improve the following deficits and impairments:  Decreased range of motion, Decreased activity tolerance, Pain, Increased muscle  spasms ? ?Visit Diagnosis: ?Acute bilateral low back pain without sciatica ? ?Radiculopathy, lumbar region ? ? ? ? ?Problem List ?Patient Active Problem List  ? Diagnosis Date Noted  ? Degenerative arthritis of knee, bilateral 01/31/2014  ? DYSPEPSIA 03/28/2009  ? ABDOMINAL TENDERNESS OTHER SPECIFIED SITE 03/28/2009  ? HELICOBACTER PYLORI GASTRITIS 03/27/2009  ? GERD 03/27/2009  ? HEARTBURN 03/27/2009  ? ? ?Elizah Lydon,[place], PTA ?12/12/2021, 5:45 PM ? ?[place] ?[facility] ?[address] ?[place], [place], [address] ?Phone: [phone]   Fax:  [phone] ? ?Name: Bonnie Martin ?MRN: [id number] ?Date of Birth: 1960/09/16 ? ? ? ?

## 2022-01-07 ENCOUNTER — Ambulatory Visit: Payer: BC Managed Care – PPO | Attending: Student in an Organized Health Care Education/Training Program

## 2022-01-07 DIAGNOSIS — M545 Low back pain, unspecified: Secondary | ICD-10-CM | POA: Diagnosis present

## 2022-01-07 NOTE — Therapy (Signed)
[place] ?[facility] ?[address] ?[place], [place], [address] ?Phone: [phone]   Fax:  [phone] ? ?Physical Therapy Treatment ? ?Patient Details  ?Name: Bonnie Martin ?MRN: [id number] ?Date of Birth: 1960/11/25 ?Referring Provider (PT): Bernerd Pho DO ? ? ?Encounter Date: 01/07/2022 ? ? PT End of Session - 01/07/22 1602   ? ? Visit Number 8   ? Number of Visits 12   ? Date for PT Re-Evaluation 12/17/21   ? PT Start Time 1600   ? PT Stop Time 1650   ? PT Time Calculation (min) 50 min   ? Activity Tolerance Patient tolerated treatment well   ? Behavior During Therapy [facility] for tasks assessed/performed   ? ?  ?  ? ?  ? ? ?Past Medical History:  ?Diagnosis Date  ? Hypertension   ? Thyroid disease   ? ? ?Past Surgical History:  ?Procedure Laterality Date  ? ABDOMINAL HYSTERECTOMY    ? BREAST EXCISIONAL BIOPSY Left   ? THYROIDECTOMY, PARTIAL Left   ? ? ?There were no vitals filed for this visit. ? ? Subjective Assessment - 01/07/22 1600   ? ? Subjective Patient reports that she is still hurting today. She had a cortisone shot in her right knee and her left hand which did not help any. She reported that she got really sick after these injections.   ? Pertinent History H/o of right knee pain, HTN, Thyroid disease.   ? Limitations Walking;Standing   ? How long can you stand comfortably? about 20 minutes   ? How long can you walk comfortably? about 20 minutes   ? Patient Stated Goals Do more with less pain, avoid surgery.   ? Currently in Pain? Yes   ? Pain Score 7    ? Pain Location Back   ? ?  ?  ? ?  ? ? ? ? ? ? ? ? ? ? ? ? ? ? ? ? ? ? ? ? [facility] Adult PT Treatment/Exercise - 01/07/22 0001   ? ?  ? Lumbar Exercises: Aerobic  ? Nustep L1 x 10 minutes   ?  ? Modalities  ? Modalities Electrical Stimulation;Moist Heat   ?  ? Moist Heat Therapy  ? Number Minutes Moist Heat 15 Minutes   ? Moist Heat Location Lumbar Spine   ?  ? Electrical Stimulation  ? Electrical Stimulation Location  B low back   no redness or adverse reaction to today's modalities  ? Electrical Stimulation Action IFC   ? Electrical Stimulation Parameters 80-150 Hz w/ 40% scan x 15 minutes   ? Electrical Stimulation Goals Pain   ?  ? Manual Therapy  ? Manual Therapy Soft tissue mobilization;Joint mobilization   ? Joint Mobilization L3-5 grade I-III   ? Soft tissue mobilization bilateral lumbar paraspinals   ? ?  ?  ? ?  ? ? ? ? ? ? ? ? ? ? ? ? ? ? ? PT Long Term Goals - 12/10/21 1755   ? ?  ? PT LONG TERM GOAL #1  ? Title Patient will be independent with her HEP.   ? Time 6   ? Period Weeks   ? Status On-going   ?  ? PT LONG TERM GOAL #2  ? Title Patient will be able to stand and walk for at least 40 minutes without being limited by her familiar low back pain.   ? Time 6   ? Period Weeks   ?  Status On-going   ?  ? PT LONG TERM GOAL #3  ? Title Sleep undisturbed 6 hours.   ? Time 6   ? Period Weeks   ? Status On-going   ?  ? PT LONG TERM GOAL #4  ? Title Walk a community distance with pain not > 3/10.   ? Time 6   ? Period Weeks   ? Status On-going   ? ?  ?  ? ?  ? ? ? ? ? ? ? ? Plan - 01/07/22 1603   ? ? Clinical Impression Statement Patient presented to treatment with increased low back pain which limited her ability to perform active interventions. Manual therapy focused on soft tissue mobilizaiton and lumbar joint mobilizations which were able to moderately reduce her familiar symptoms. She reported that her back felt better upon the conclusion of treatment. She would benefit from continued skilled physical therapy to address her remaining impairments to maximize her functional mobility.   ? Personal Factors and Comorbidities Comorbidity 1;Time since onset of injury/illness/exacerbation   ? Comorbidities H/o of right knee pain, HTN, Thyroid disease.   ? Examination-Activity Limitations Locomotion Level;Transfers;Sit;Stand   ? Examination-Participation Restrictions Occupation;Cleaning;Community Activity;Shop   ? Rehab  Potential Good   ? PT Frequency 2x / week   ? PT Duration 6 weeks   ? PT Treatment/Interventions Electrical Stimulation;Moist Heat;Therapeutic activities;Therapeutic exercise;Manual techniques;Patient/family education;Cryotherapy;Ultrasound   ? PT Next Visit Plan Combo e'stim/US, STW/M, core exercise progression.   ? Consulted and Agree with Plan of Care Patient   ? ?  ?  ? ?  ? ? ?Patient will benefit from skilled therapeutic intervention in order to improve the following deficits and impairments:  Decreased range of motion, Decreased activity tolerance, Pain, Increased muscle spasms ? ?Visit Diagnosis: ?Acute bilateral low back pain without sciatica ? ? ? ? ?Problem List ?Patient Active Problem List  ? Diagnosis Date Noted  ? Degenerative arthritis of knee, bilateral 01/31/2014  ? DYSPEPSIA 03/28/2009  ? ABDOMINAL TENDERNESS OTHER SPECIFIED SITE 03/28/2009  ? HELICOBACTER PYLORI GASTRITIS 03/27/2009  ? GERD 03/27/2009  ? HEARTBURN 03/27/2009  ? ? ?Granville Lewis, PT ?01/07/2022, 5:04 PM ? ?[place] ?[facility] ?[address] ?[place], [place], [address] ?Phone: [phone]   Fax:  [phone] ? ?Name: Bonnie Martin ?MRN: [id number] ?Date of Birth: 26-Dec-1960 ? ? ? ?

## 2022-01-09 ENCOUNTER — Ambulatory Visit: Payer: BC Managed Care – PPO | Admitting: Physical Therapy

## 2022-01-14 ENCOUNTER — Ambulatory Visit
Payer: BC Managed Care – PPO | Attending: Student in an Organized Health Care Education/Training Program | Admitting: *Deleted

## 2022-01-14 DIAGNOSIS — M545 Low back pain, unspecified: Secondary | ICD-10-CM | POA: Insufficient documentation

## 2022-01-14 DIAGNOSIS — M5416 Radiculopathy, lumbar region: Secondary | ICD-10-CM | POA: Diagnosis present

## 2022-01-14 NOTE — Therapy (Signed)
[place] ?[facility] ?[place] ?[place], [place], [address] ?Phone: [phone]   Fax:  [phone] ? ?Physical Therapy Treatment ? ?Patient Details  ?Name: Bonnie Martin ?MRN: [id number] ?Date of Birth: 13-May-1961 ?Referring Provider (PT): Harrel Carina DO ? ? ?Encounter Date: 01/14/2022 ? ? PT End of Session - 01/14/22 1732   ? ? Visit Number 9   ? Number of Visits 12   ? Date for PT Re-Evaluation 12/17/21   ? PT Start Time [id number]   ? PT Stop Time 1735   ? PT Time Calculation (min) 50 min   ? ?  ?  ? ?  ? ? ?Past Medical History:  ?Diagnosis Date  ? Hypertension   ? Thyroid disease   ? ? ?Past Surgical History:  ?Procedure Laterality Date  ? ABDOMINAL HYSTERECTOMY    ? BREAST EXCISIONAL BIOPSY Left   ? THYROIDECTOMY, PARTIAL Left   ? ? ?There were no vitals filed for this visit. ? ? Subjective Assessment - 01/14/22 1643   ? ? Pertinent History H/o of right knee pain, HTN, Thyroid disease.   ? Limitations Walking;Standing   ? How long can you stand comfortably? about 20 minutes   ? How long can you walk comfortably? about 20 minutes   ? Patient Stated Goals Do more with less pain, avoid surgery.   ? ?  ?  ? ?  ? ? ? ? ? ? ? ? ? ? ? ? ? ? ? ? ? ? ? ? [facility] Adult PT Treatment/Exercise - 01/14/22 0001   ? ?  ? Lumbar Exercises: Aerobic  ? Nustep --   ?  ? Modalities  ? Modalities Electrical Stimulation;Moist Heat   ?  ? Moist Heat Therapy  ? Number Minutes Moist Heat 15 Minutes   ? Moist Heat Location Lumbar Spine   ?  ? Electrical Stimulation  ? Electrical Stimulation Location B low back   ? Electrical Stimulation Action IFC   ? Electrical Stimulation Parameters 80-150hz  x 15 mins   ? Electrical Stimulation Goals Pain   ?  ? Ultrasound  ? Ultrasound Location Bil.LB paras   ? Ultrasound Parameters combo 1.5 w/cm2 x 12 mins   ? Ultrasound Goals Pain   ?  ? Manual Therapy  ? Manual Therapy Soft tissue mobilization;Joint mobilization   ? Soft tissue mobilization STW in sitting to  Billumbar paras x 12 mins   ? ?  ?  ? ?  ? ? ? ? ? ? ? ? ? ? ? ? ? ? ? PT Long Term Goals - 12/10/21 1755   ? ?  ? PT LONG TERM GOAL #1  ? Title Patient will be independent with her HEP.   ? Time 6   ? Period Weeks   ? Status On-going   ?  ? PT LONG TERM GOAL #2  ? Title Patient will be able to stand and walk for at least 40 minutes without being limited by her familiar low back pain.   ? Time 6   ? Period Weeks   ? Status On-going   ?  ? PT LONG TERM GOAL #3  ? Title Sleep undisturbed 6 hours.   ? Time 6   ? Period Weeks   ? Status On-going   ?  ? PT LONG TERM GOAL #4  ? Title Walk a community distance with pain not > 3/10.   ? Time 6   ? Period Weeks   ?  Status On-going   ? ?  ?  ? ?  ? ? ? ? ? ? ? ? Plan - 01/14/22 1734   ? ? Clinical Impression Statement Pt arrived today doing fair with LBP. She was unable to perform exercises today due to Knee pain. [place] combo f/b STW was performed to Bil lumbar paras in sitting. Notable tightness and soreness on LT side. Decreased pain end of session. Continue PT to address remaining impairments   ? Personal Factors and Comorbidities Comorbidity 1;Time since onset of injury/illness/exacerbation   ? Comorbidities H/o of right knee pain, HTN, Thyroid disease.   ? Examination-Participation Restrictions Occupation;Cleaning;Community Activity;Shop   ? Rehab Potential Good   ? PT Frequency 2x / week   ? PT Duration 6 weeks   ? PT Treatment/Interventions Electrical Stimulation;Moist Heat;Therapeutic activities;Therapeutic exercise;Manual techniques;Patient/family education;Cryotherapy;Ultrasound   ? PT Next Visit Plan Combo e'stim/US, STW/M, core exercise progression.   ? Consulted and Agree with Plan of Care Patient   ? ?  ?  ? ?  ? ? ?Patient will benefit from skilled therapeutic intervention in order to improve the following deficits and impairments:  Decreased range of motion, Decreased activity tolerance, Pain, Increased muscle spasms ? ?Visit Diagnosis: ?Acute bilateral low back  pain without sciatica ? ?Radiculopathy, lumbar region ? ? ? ? ?Problem List ?Patient Active Problem List  ? Diagnosis Date Noted  ? Degenerative arthritis of knee, bilateral 01/31/2014  ? DYSPEPSIA 03/28/2009  ? ABDOMINAL TENDERNESS OTHER SPECIFIED SITE 03/28/2009  ? HELICOBACTER PYLORI GASTRITIS 03/27/2009  ? GERD 03/27/2009  ? HEARTBURN 03/27/2009  ? ? ?Jeovanni Heuring,CHRIS, PTA ?01/14/2022, 5:44 PM ? ?[place] ?[facility] ?[place] ?[place], [place], [address] ?Phone: [phone]   Fax:  [phone] ? ?Name: Bonnie Martin ?MRN: [id number] ?Date of Birth: 02/26/1961 ? ? ? ?

## 2022-01-16 ENCOUNTER — Ambulatory Visit: Payer: BC Managed Care – PPO

## 2022-01-16 DIAGNOSIS — M545 Low back pain, unspecified: Secondary | ICD-10-CM | POA: Diagnosis not present

## 2022-01-16 NOTE — Therapy (Signed)
[place] ?[facility] ?[address] ?[place], [place], [address] ?Phone: [phone]   Fax:  [phone] ? ?Physical Therapy Evaluation ? ?Patient Details  ?Name: Bonnie Martin ?MRN: [id number] ?Date of Birth: [birth year] ?Referring Provider (PT): Harrel Carina DO ? ? ?Encounter Date: 01/16/2022 ? ? PT End of Session - 01/16/22 1650   ? ? Visit Number 10   ? Number of Visits 12   ? Date for PT Re-Evaluation 01/28/22   ? PT Start Time 1638   ? PT Stop Time 3704   ? PT Time Calculation (min) 55 min   ? Activity Tolerance Patient limited by pain   ? Behavior During Therapy [facility] for tasks assessed/performed   ? ?  ?  ? ?  ? ? ?Past Medical History:  ?Diagnosis Date  ? Hypertension   ? Thyroid disease   ? ? ?Past Surgical History:  ?Procedure Laterality Date  ? ABDOMINAL HYSTERECTOMY    ? BREAST EXCISIONAL BIOPSY Left   ? THYROIDECTOMY, PARTIAL Left   ? ? ?There were no vitals filed for this visit. ? ? ? Subjective Assessment - 01/16/22 1642   ? ? Subjective Patiet reports that her back is bothering her a little. However, her knee is bothering her more. She feels that her back is staying "about the same" since beginning therapy.   ? Pertinent History H/o of right knee pain, HTN, Thyroid disease.   ? Limitations Walking;Standing   ? How long can you stand comfortably? about 20 minutes   ? How long can you walk comfortably? about 20 minutes   ? Patient Stated Goals Do more with less pain, avoid surgery.   ? Currently in Pain? Yes   ? Pain Score 6    ? Pain Location Back   ? ?  ?  ? ?  ? ? ? ? ? ? ? ? ? ? ? ? ? ? ? ? ? ?Objective measurements completed on examination: See above findings.  ? ? ? ? ? [facility] Adult PT Treatment/Exercise - 01/16/22 0001   ? ?  ? Lumbar Exercises: Aerobic  ? Nustep L3 x 15 minutes   ?  ? Moist Heat Therapy  ? Number Minutes Moist Heat 15 Minutes   ? Moist Heat Location Lumbar Spine   ?  ? Electrical Stimulation  ? Electrical Stimulation Location L LB and glutes    ? Electrical Stimulation Action IFC   ? Electrical Stimulation Parameters 80-150 Hz w/ 40% scan x 16 minutes   ? Electrical Stimulation Goals Pain   ?  ? Manual Therapy  ? Manual Therapy Soft tissue mobilization;Joint mobilization   ? Joint Mobilization L3-5 grade I-III   ? Soft tissue mobilization bilateral lumbar paraspinals   ? ?  ?  ? ?  ? ? ? ? ? ? ? ? ? ? ? ? ? ? ? PT Long Term Goals - 01/16/22 1650   ? ?  ? PT LONG TERM GOAL #1  ? Title Patient will be independent with her HEP.   ? Baseline tries to do some at least 3-4 times per week   ? Time 6   ? Period Weeks   ? Status Partially Met   ?  ? PT LONG TERM GOAL #2  ? Title Patient will be able to stand and walk for at least 40 minutes without being limited by her familiar low back pain.   ? Baseline 30-45 minutes   ? Time 6   ?  Period Weeks   ? Status Partially Met   ?  ? PT LONG TERM GOAL #3  ? Title Sleep undisturbed 6 hours.   ? Baseline about 4 hours   ? Time 6   ? Period Weeks   ? Status On-going   ?  ? PT LONG TERM GOAL #4  ? Title Walk a community distance with pain not > 3/10.   ? Time 6   ? Period Weeks   ? Status On-going   ? ?  ?  ? ?  ? ? ? ? ? ? ? ? ? Plan - 01/16/22 1650   ? ? Clinical Impression Statement Patient has made minimal progress with skilled physical therapy. This is evidenced by her subjective reports and her progress toward her goals. Treatment focused on familair interventions for reduced pain severity. She continues to be limited by her familiar knee pain. Manual therapy focused on light lumbar joint mobilizations and soft tissue mobilization to her lumbar paraspinals. She reported feeling a little better at the conclusion of treatment. She may benefit from additional medical intervention due to continued low back and her knee pain which limits her ability to participate in therapy.   ? Personal Factors and Comorbidities Comorbidity 1;Time since onset of injury/illness/exacerbation   ? Comorbidities H/o of right knee pain, HTN,  Thyroid disease.   ? Examination-Participation Restrictions Occupation;Cleaning;Community Activity;Shop   ? Rehab Potential Good   ? PT Frequency 2x / week   ? PT Duration 6 weeks   ? PT Treatment/Interventions Electrical Stimulation;Moist Heat;Therapeutic activities;Therapeutic exercise;Manual techniques;Patient/family education;Cryotherapy;Ultrasound   ? PT Next Visit Plan Combo e'stim/US, STW/M, core exercise progression.   ? Consulted and Agree with Plan of Care Patient   ? ?  ?  ? ?  ? ? ?Patient will benefit from skilled therapeutic intervention in order to improve the following deficits and impairments:  Decreased range of motion, Decreased activity tolerance, Pain, Increased muscle spasms ? ?Visit Diagnosis: ?Acute bilateral low back pain without sciatica ? ? ? ? ?Problem List ?Patient Active Problem List  ? Diagnosis Date Noted  ? Degenerative arthritis of knee, bilateral 01/31/2014  ? DYSPEPSIA 03/28/2009  ? ABDOMINAL TENDERNESS OTHER SPECIFIED SITE 03/28/2009  ? HELICOBACTER PYLORI GASTRITIS 03/27/2009  ? GERD 03/27/2009  ? HEARTBURN 03/27/2009  ? ? ?Jarrett C Barts, PT ?01/16/2022, 5:43 PM ? ?[place] ?[facility] ?[address] ?[place], [place], [address] ?Phone: [phone]   Fax:  [phone] ? ?Name: Bonnie Martin ?MRN: [id number] ?Date of Birth: 02/25/1961 ? ?Progress Note ?Reporting Period 11/05/21 to 01/16/22 ? ?See note below for Objective Data and Assessment of Progress/Goals.  ? ?See clinical impression statement as no long term progress has been made at this time ?

## 2022-01-20 ENCOUNTER — Encounter (HOSPITAL_COMMUNITY): Payer: Self-pay | Admitting: Emergency Medicine

## 2022-01-20 ENCOUNTER — Emergency Department (HOSPITAL_COMMUNITY)
Admission: EM | Admit: 2022-01-20 | Discharge: 2022-01-20 | Disposition: A | Discharge status: Home / Self Care | Payer: BC Managed Care – PPO | Attending: Emergency Medicine | Admitting: Emergency Medicine

## 2022-01-20 ENCOUNTER — Emergency Department (HOSPITAL_BASED_OUTPATIENT_CLINIC_OR_DEPARTMENT_OTHER)
Admit: 2022-01-20 | Discharge: 2022-01-20 | Disposition: A | Discharge status: Home / Self Care | Payer: BC Managed Care – PPO

## 2022-01-20 DIAGNOSIS — W1809XA Striking against other object with subsequent fall, initial encounter: Secondary | ICD-10-CM | POA: Diagnosis not present

## 2022-01-20 DIAGNOSIS — S8992XA Unspecified injury of left lower leg, initial encounter: Secondary | ICD-10-CM | POA: Diagnosis present

## 2022-01-20 DIAGNOSIS — S8012XA Contusion of left lower leg, initial encounter: Secondary | ICD-10-CM | POA: Diagnosis not present

## 2022-01-20 DIAGNOSIS — M7989 Other specified soft tissue disorders: Secondary | ICD-10-CM | POA: Diagnosis not present

## 2022-01-20 NOTE — ED Notes (Signed)
Pt has large ecchymotic area on anterior of left leg just below the knee. Pt has 2+ left pedal pulse, cap refill less than 3 sec, pt able to wiggle toes. ?

## 2022-01-20 NOTE — ED Provider Notes (Signed)
?[facility] EMERGENCY DEPARTMENT ?Provider Note ? ? ?CSN: [id number] ?Arrival date & time: 01/20/22  1558 ? ?  ? ?History ? ?Chief Complaint  ?Patient presents with  ? Leg Pain  ? ? ?Bonnie Martin is a 61 y.o. female. ? ?Patient here for DVT study to her left lower leg.  She hit her left shin a while back.  Had negative x-rays.  She has significant bruising to the anterior left shin but has had swelling and PCP wanted DVT study.  She denies any history of clots.  Denies any fevers or chills. ? ?The history is provided by the patient.  ?Leg Pain ? ?  ? ?Home Medications ?Prior to Admission medications   ?Medication Sig Start Date End Date Taking? Authorizing Provider  ?allopurinol (ZYLOPRIM) 100 MG tablet Take 1 tablet by mouth daily. 10/15/16   [provider]  ?amLODipine (NORVASC) 10 MG tablet Take 1 tablet by mouth daily. 10/21/16 10/21/17  [provider]  ?furosemide (LASIX) 20 MG tablet Take 1 tablet by mouth 2 (two) times daily. 06/19/16 06/19/17  [provider]  ?ibuprofen (ADVIL) 800 MG tablet Take 1 tablet (800 mg total) by mouth 3 (three) times daily. Take with food 06/10/21   Triplett, Tammy, PA-C  ?lansoprazole (PREVACID) 30 MG capsule Take 1 capsule by mouth daily. 05/21/16 05/21/17  [provider]  ?levothyroxine (SYNTHROID, LEVOTHROID) 125 MCG tablet Take 1 tablet by mouth daily. 05/06/16   [provider]  ?losartan (COZAAR) 100 MG tablet Take 1 tablet by mouth daily. 06/19/16 06/19/17  [provider]  ?methocarbamol (ROBAXIN) 500 MG tablet Take 1 tablet (500 mg total) by mouth 3 (three) times daily. Medication may cause drowsiness 06/10/21   Triplett, Tammy, PA-C  ?metoprolol succinate (TOPROL-XL) 100 MG 24 hr tablet Take 1 tablet by mouth daily. 04/22/16   [provider]  ?   ? ?Allergies    ?Shellfish allergy and Sulfonamide derivatives   ? ?Review of Systems   ?Review of Systems ? ?Physical Exam ?Updated Vital Signs ?BP  130/72   Pulse 83   Temp 98.1 ?F (36.7 ?C) (Oral)   Resp 16   Ht 5\' 4"  (1.626 m)   Wt (!) 138 kg   SpO2 98%   BMI 52.22 kg/m?  ?Physical Exam ?Constitutional:   ?   General: She is not in acute distress. ?   Appearance: She is not ill-appearing.  ?Cardiovascular:  ?   Pulses: Normal pulses.  ?Musculoskeletal:     ?   General: Normal range of motion.  ?Skin: ?   Findings: Bruising present. No erythema.  ?   Comments: There is significant hematoma and bruising to the left anterior shin  ?Neurological:  ?   General: No focal deficit present.  ?   Mental Status: She is alert.  ? ? ?ED Results / Procedures / Treatments   ?Labs ?(all labs ordered are listed, but only abnormal results are displayed) ?Labs Reviewed - No data to display ? ?EKG ?None ? ?Radiology ?VAS LOWER EXTREMITY VENOUS (DVT) (ONLY [facility] & [facility]) ? ?Result Date: 01/20/2022 ? Lower Venous DVT Study Patient Name:  Bonnie Martin  Date of Exam:   01/20/2022 Medical Rec #: 03/22/2022             Accession #:    [id number] Date of Birth: 1961/01/09            Patient Gender: F Patient Age:   25 years  Exam Location:  [facility] Procedure:      VAS [place] LOWER EXTREMITY VENOUS (DVT) Referring Phys: Parke Poisson --------------------------------------------------------------------------------  Indications: Swelling.  Risk Factors: None identified. Limitations: Body habitus and poor ultrasound/tissue interface. Comparison Study: No prior studies. Performing Technologist: Chanda Busing RVT  Examination Guidelines: A complete evaluation includes B-mode imaging, spectral Doppler, color Doppler, and power Doppler as needed of all accessible portions of each vessel. Bilateral testing is considered an integral part of a complete examination. Limited examinations for reoccurring indications may be performed as noted. The reflux portion of the exam is performed with the patient in reverse Trendelenburg.   +---------+---------------+---------+-----------+----------+-------------------+ RIGHT    CompressibilityPhasicitySpontaneityPropertiesThrombus Aging      +---------+---------------+---------+-----------+----------+-------------------+ CFV      Full           Yes      Yes                                      +---------+---------------+---------+-----------+----------+-------------------+ SFJ      Full                                                             +---------+---------------+---------+-----------+----------+-------------------+ FV Prox  Full                                                             +---------+---------------+---------+-----------+----------+-------------------+ FV Mid                  Yes      Yes                                      +---------+---------------+---------+-----------+----------+-------------------+ FV Distal               Yes      Yes                                      +---------+---------------+---------+-----------+----------+-------------------+ PFV      Full                                                             +---------+---------------+---------+-----------+----------+-------------------+ POP      Full           Yes      Yes                                      +---------+---------------+---------+-----------+----------+-------------------+ PTV      Full                                                             +---------+---------------+---------+-----------+----------+-------------------+  PERO                                                  Not well visualized +---------+---------------+---------+-----------+----------+-------------------+   +---------+---------------+---------+-----------+----------+-------------------+ LEFT     CompressibilityPhasicitySpontaneityPropertiesThrombus Aging      +---------+---------------+---------+-----------+----------+-------------------+  CFV      Full           Yes      Yes                                      +---------+---------------+---------+-----------+----------+-------------------+ SFJ      Full                                                             +---------+---------------+---------+-----------+----------+-------------------+ FV Prox  Full                                                             +---------+---------------+---------+-----------+----------+-------------------+ FV Mid   Full                                                             +---------+---------------+---------+-----------+----------+-------------------+ FV Distal               Yes      Yes                                      +---------+---------------+---------+-----------+----------+-------------------+ PFV      Full                                                             +---------+---------------+---------+-----------+----------+-------------------+ POP      Full           Yes      Yes                                      +---------+---------------+---------+-----------+----------+-------------------+ PTV      Full                                                             +---------+---------------+---------+-----------+----------+-------------------+ PERO  Not well visualized +---------+---------------+---------+-----------+----------+-------------------+    Summary: RIGHT: - There is no evidence of deep vein thrombosis in the lower extremity. However, portions of this examination were limited- see technologist comments above.  - No cystic structure found in the popliteal fossa.  LEFT: - There is no evidence of deep vein thrombosis in the lower extremity. However, portions of this examination were limited- see technologist comments above.  - No cystic structure found in the popliteal fossa.  *See table(s) above for measurements and  observations.    Preliminary    ? ?Procedures ?Procedures  ? ? ?Medications Ordered in ED ?Medications - No data to display ? ?ED Course/ Medical Decision Making/ A&P ?  ?                        ?Medical Decision Making ? ?Marizol Borror Joyce-Tatum is here with ongoing pain to her left lower leg.  She injured this left lower leg a while back.  Has significant bruising and hematoma.  Still with some pain and swel

## 2022-01-20 NOTE — Progress Notes (Signed)
Bilateral lower extremity venous duplex has been completed. ?Preliminary results can be found in CV Proc through chart review.  ?Results were given to [facility] PA. ? ?01/20/22 7:00 PM ?Olen Cordial RVT   ?

## 2022-01-20 NOTE — Discharge Instructions (Signed)
There is no blood clot.  Overall suspect you have ongoing hematoma and bruise.  Recommend ice.  This will heal on its own. ?

## 2022-01-20 NOTE — ED Provider Triage Note (Signed)
Emergency Medicine Provider Triage Evaluation Note ? ?Bonnie Martin , a 61 y.o. female  was evaluated in triage.  Pt complains of swelling to bilateral lower extremities.  Patient reports that she has had a few injuries to her left leg which have caused bruising and increased swelling.  Patient was seen by her PCP earlier today and was sent to the emergency department for further evaluation to help rule out a DVT as cause of patient's swelling. ? ?Denies any chest pain, shortness of breath, hemoptysis, numbness, weakness. ? ?Review of Systems  ?Positive: Swelling to bilateral lower extremities ?Negative: See above ? ?Physical Exam  ?BP 130/72   Pulse 83   Temp 98.1 ?F (36.7 ?C) (Oral)   Resp 16   Ht 5\' 4"  (1.626 m)   Wt (!) 138 kg   SpO2 98%   BMI 52.22 kg/m?  ?Gen:   Awake, no distress   ?Resp:  Normal effort  ?MSK:   Moves extremities without difficulty  ?Other:   ? ?Medical Decision Making  ?Medically screening exam initiated at 4:33 PM.  Appropriate orders placed.  Sloan Galentine Martin was informed that the remainder of the evaluation will be completed by another provider, this initial triage assessment does not replace that evaluation, and the importance of remaining in the ED until their evaluation is complete. ? ? ?  ?Bosie Clos, PA-C ?01/20/22 1634 ? ?

## 2022-01-20 NOTE — ED Triage Notes (Signed)
Pt reports falling and hitting left leg last week. Sent by PCP for DVT study due to bruising and swelling.  ?

## 2022-01-28 ENCOUNTER — Ambulatory Visit: Payer: BC Managed Care – PPO | Admitting: *Deleted

## 2022-01-28 DIAGNOSIS — M545 Low back pain, unspecified: Secondary | ICD-10-CM | POA: Diagnosis not present

## 2022-01-28 NOTE — Therapy (Addendum)
[facility] [place], [place], [address] Phone: [phone]   Fax:  [phone]  Physical Therapy Treatment  Patient Details  Name: Bonnie Martin MRN: [id number] Date of Birth: [birth year] Referring Provider (PT): Harrel Carina DO   Encounter Date: 01/28/2022   PT End of Session - 01/28/22 1728     Visit Number 11    Number of Visits 12    Date for PT Re-Evaluation 01/28/22    PT Start Time 1219    PT Stop Time 1735    PT Time Calculation (min) 50 min             Past Medical History:  Diagnosis Date   Hypertension    Thyroid disease     Past Surgical History:  Procedure Laterality Date   ABDOMINAL HYSTERECTOMY     BREAST EXCISIONAL BIOPSY Left    THYROIDECTOMY, PARTIAL Left     There were no vitals filed for this visit.   Subjective Assessment - 01/28/22 1704     Subjective Went to MD and he want's me to cont. PT and will send a N.O. over.    Pertinent History H/o of right knee pain, HTN, Thyroid disease.    Limitations Walking;Standing    How long can you stand comfortably? about 20 minutes    How long can you walk comfortably? about 20 minutes    Patient Stated Goals Do more with less pain, avoid surgery.    Currently in Pain? Yes    Pain Score 5     Pain Location Back    Pain Orientation Right;Left    Pain Descriptors / Indicators Aching;Sore                               [facility] Adult PT Treatment/Exercise - 01/28/22 0001       Exercises   Exercises Lumbar      Lumbar Exercises: Aerobic   Nustep L4 x 15 minutes      Lumbar Exercises: Standing   Row Strengthening;Both;10 reps   hold 5 secs   Shoulder Extension Strengthening;Both;10 reps   XTS blue hold 5 secs     Lumbar Exercises: Supine   Ab Set 10 reps;5 seconds   seated with lat pulldown on red ex ball     Modalities   Modalities Electrical Stimulation;Moist Heat      Moist Heat Therapy   Number Minutes  Moist Heat 15 Minutes    Moist Heat Location Lumbar Spine      Electrical Stimulation   Electrical Stimulation Location L LB and glutes    Electrical Stimulation Action IFC    Electrical Stimulation Parameters 80-150hz  x 15 mins    Electrical Stimulation Goals Pain                          PT Long Term Goals - 01/16/22 1650       PT LONG TERM GOAL #1   Title Patient will be independent with her HEP.    Baseline tries to do some at least 3-4 times per week    Time 6    Period Weeks    Status Partially Met      PT LONG TERM GOAL #2   Title Patient will be able to stand and walk for at least 40 minutes without being limited by her familiar low back pain.  Baseline 30-45 minutes    Time 6    Period Weeks    Status Partially Met      PT LONG TERM GOAL #3   Title Sleep undisturbed 6 hours.    Baseline about 4 hours    Time 6    Period Weeks    Status On-going      PT LONG TERM GOAL #4   Title Walk a community distance with pain not > 3/10.    Time 6    Period Weeks    Status On-going                   Plan - 01/28/22 1730     Clinical Impression Statement Pt arrived today doing fairly well and reports f/u with MD. and that he wants her to cont. PT. Rx focused on core activation and strengthening and Pt did well. Estim end of session for pain control. Continue to perform core exs that don't cause LB pain triggers and aggravate her knee.    Personal Factors and Comorbidities Comorbidity 1;Time since onset of injury/illness/exacerbation    Comorbidities H/o of right knee pain, HTN, Thyroid disease.    Examination-Participation Restrictions Occupation;Cleaning;Community Activity;Shop    Rehab Potential Good    PT Frequency 2x / week    PT Duration 6 weeks    PT Treatment/Interventions Electrical Stimulation;Moist Heat;Therapeutic activities;Therapeutic exercise;Manual techniques;Patient/family education;Cryotherapy;Ultrasound    PT Next Visit Plan  Combo e'stim/US, STW/M, core exercise progression.    Consulted and Agree with Plan of Care Patient             Patient will benefit from skilled therapeutic intervention in order to improve the following deficits and impairments:  Decreased range of motion, Decreased activity tolerance, Pain, Increased muscle spasms  Visit Diagnosis: Acute bilateral low back pain without sciatica     Problem List Patient Active Problem List   Diagnosis Date Noted   Degenerative arthritis of knee, bilateral 01/31/2014   DYSPEPSIA 03/28/2009   ABDOMINAL TENDERNESS OTHER SPECIFIED SITE 16/06/9603   HELICOBACTER PYLORI GASTRITIS 03/27/2009   GERD 03/27/2009   HEARTBURN 03/27/2009    Deegan Valentino,CHRIS, PTA 01/28/2022, 5:46 PM  [facility] [address] [place], [place], [address] Phone: [phone]   Fax:  [phone]  Name: Bonnie Martin MRN: [id number] Date of Birth: 08-28-1961

## 2022-02-06 ENCOUNTER — Encounter: Payer: BC Managed Care – PPO | Admitting: *Deleted

## 2022-02-12 ENCOUNTER — Encounter (HOSPITAL_BASED_OUTPATIENT_CLINIC_OR_DEPARTMENT_OTHER): Payer: Self-pay | Admitting: Emergency Medicine

## 2022-02-12 ENCOUNTER — Other Ambulatory Visit: Payer: Self-pay

## 2022-02-12 ENCOUNTER — Emergency Department (HOSPITAL_BASED_OUTPATIENT_CLINIC_OR_DEPARTMENT_OTHER): Payer: BC Managed Care – PPO | Admitting: Radiology

## 2022-02-12 ENCOUNTER — Emergency Department (HOSPITAL_BASED_OUTPATIENT_CLINIC_OR_DEPARTMENT_OTHER)
Admission: EM | Admit: 2022-02-12 | Discharge: 2022-02-12 | Disposition: A | Discharge status: Home / Self Care | Payer: BC Managed Care – PPO | Attending: Emergency Medicine | Admitting: Emergency Medicine

## 2022-02-12 DIAGNOSIS — S52501A Unspecified fracture of the lower end of right radius, initial encounter for closed fracture: Secondary | ICD-10-CM | POA: Diagnosis not present

## 2022-02-12 DIAGNOSIS — M25531 Pain in right wrist: Secondary | ICD-10-CM | POA: Diagnosis not present

## 2022-02-12 DIAGNOSIS — W19XXXA Unspecified fall, initial encounter: Secondary | ICD-10-CM

## 2022-02-12 DIAGNOSIS — S6991XA Unspecified injury of right wrist, hand and finger(s), initial encounter: Secondary | ICD-10-CM | POA: Diagnosis present

## 2022-02-12 DIAGNOSIS — W010XXA Fall on same level from slipping, tripping and stumbling without subsequent striking against object, initial encounter: Secondary | ICD-10-CM | POA: Diagnosis not present

## 2022-02-12 MED ORDER — OXYCODONE-ACETAMINOPHEN 5-325 MG PO TABS: 1.0000 | ORAL_TABLET | Freq: Three times a day (TID) | ORAL | 0 refills | Status: DC | PRN

## 2022-02-12 NOTE — ED Triage Notes (Signed)
Slipped and fell leaving work, landed on right wrist. Swelling. Able to move finger and bend at elbow. Happened less than 1 hr ago.

## 2022-02-12 NOTE — ED Provider Notes (Signed)
[facility] EMERGENCY DEPT Provider Note   CSN: [id number] Arrival date & time: 02/12/22  1502     History {Add pertinent medical, surgical, social history, OB history to HPI:1} Chief Complaint  Patient presents with   Wrist Pain    Bonnie Martin is a 61 y.o. female.   Wrist Pain Patient presents with pain in right wrist and hand after fall.  Lost balance and her foot slipped after her divorce approximately opened.  Fell on her right wrist.  No loss conscious.  No other injury.  Not on blood thinners.  Does have a history of some chronic pain in both her wrists.  She is right-hand dominant.  No neck pain.     Home Medications Prior to Admission medications   Medication Sig Start Date End Date Taking? Authorizing Provider  allopurinol (ZYLOPRIM) 100 MG tablet Take 1 tablet by mouth daily. 10/15/16   [provider]  amLODipine (NORVASC) 10 MG tablet Take 1 tablet by mouth daily. 10/21/16 10/21/17  [provider]  furosemide (LASIX) 20 MG tablet Take 1 tablet by mouth 2 (two) times daily. 06/19/16 06/19/17  [provider]  ibuprofen (ADVIL) 800 MG tablet Take 1 tablet (800 mg total) by mouth 3 (three) times daily. Take with food 06/10/21   Triplett, Tammy, PA-C  lansoprazole (PREVACID) 30 MG capsule Take 1 capsule by mouth daily. 05/21/16 05/21/17  [provider]  levothyroxine (SYNTHROID, LEVOTHROID) 125 MCG tablet Take 1 tablet by mouth daily. 05/06/16   [provider]  losartan (COZAAR) 100 MG tablet Take 1 tablet by mouth daily. 06/19/16 06/19/17  [provider]  methocarbamol (ROBAXIN) 500 MG tablet Take 1 tablet (500 mg total) by mouth 3 (three) times daily. Medication may cause drowsiness 06/10/21   Triplett, Tammy, PA-C  metoprolol succinate (TOPROL-XL) 100 MG 24 hr tablet Take 1 tablet by mouth daily. 04/22/16   [provider]      Allergies    Shellfish allergy and Sulfonamide derivatives    Review  of Systems   Review of Systems  Neurological:  Negative for weakness and numbness.   Physical Exam Updated Vital Signs BP (!) 146/122 (BP Location: Left Arm)   Pulse 75   Temp 98.3 F (36.8 C)   Resp 18   Ht 5\' 4"  (1.626 m)   Wt 135.2 kg   SpO2 100%   BMI 51.15 kg/m  Physical Exam Vitals and nursing note reviewed.  HENT:     Head: Atraumatic.  Cardiovascular:     Rate and Rhythm: Regular rhythm.  Musculoskeletal:        General: Tenderness present.     Comments: Tenderness over right wrist particular over the radial aspect.  Mild swelling.  Skin intact.  Also tenderness over radial aspect of right hand.  Sensation intact distally.  Radial pulse intact.  No tenderness over elbow.  No tenderness over shoulder.  Neurological:     Mental Status: She is alert and oriented to person, place, and time.    ED Results / Procedures / Treatments   Labs (all labs ordered are listed, but only abnormal results are displayed) Labs Reviewed - No data to display  EKG None  Radiology No results found.  Procedures Procedures  {Document cardiac monitor, telemetry assessment procedure when appropriate:1}  Medications Ordered in ED Medications - No data to display  ED Course/ Medical Decision Making/ A&P  Medical Decision Making Amount and/or Complexity of Data Reviewed Radiology: ordered.   ***  {Document critical care time when appropriate:1} {Document review of labs and clinical decision tools ie heart score, Chads2Vasc2 etc:1}  {Document your independent review of radiology images, and any outside records:1} {Document your discussion with family members, caretakers, and with consultants:1} {Document social determinants of health affecting pt's care:1} {Document your decision making why or why not admission, treatments were needed:1} Final Clinical Impression(s) / ED Diagnoses Final diagnoses:  None    Rx / DC Orders ED Discharge Orders      None

## 2022-02-13 ENCOUNTER — Encounter: Payer: BC Managed Care – PPO | Admitting: *Deleted

## 2022-03-11 DIAGNOSIS — S52501A Unspecified fracture of the lower end of right radius, initial encounter for closed fracture: Secondary | ICD-10-CM | POA: Insufficient documentation

## 2022-04-28 ENCOUNTER — Other Ambulatory Visit (HOSPITAL_BASED_OUTPATIENT_CLINIC_OR_DEPARTMENT_OTHER): Payer: Self-pay

## 2022-04-28 ENCOUNTER — Other Ambulatory Visit: Payer: Self-pay

## 2022-04-28 ENCOUNTER — Emergency Department (HOSPITAL_BASED_OUTPATIENT_CLINIC_OR_DEPARTMENT_OTHER): Payer: BC Managed Care – PPO | Admitting: Radiology

## 2022-04-28 ENCOUNTER — Emergency Department (HOSPITAL_BASED_OUTPATIENT_CLINIC_OR_DEPARTMENT_OTHER)
Admission: EM | Admit: 2022-04-28 | Discharge: 2022-04-28 | Disposition: A | Discharge status: Home / Self Care | Payer: BC Managed Care – PPO | Attending: Emergency Medicine | Admitting: Emergency Medicine

## 2022-04-28 DIAGNOSIS — R072 Precordial pain: Secondary | ICD-10-CM | POA: Diagnosis not present

## 2022-04-28 DIAGNOSIS — R079 Chest pain, unspecified: Secondary | ICD-10-CM | POA: Diagnosis present

## 2022-04-28 DIAGNOSIS — I10 Essential (primary) hypertension: Secondary | ICD-10-CM | POA: Insufficient documentation

## 2022-04-28 DIAGNOSIS — M79602 Pain in left arm: Secondary | ICD-10-CM | POA: Insufficient documentation

## 2022-04-28 DIAGNOSIS — Z79899 Other long term (current) drug therapy: Secondary | ICD-10-CM | POA: Insufficient documentation

## 2022-04-28 LAB — CBC
HCT: 39.3 % (ref 36.0–46.0)
Hemoglobin: 13.4 g/dL (ref 12.0–15.0)
MCH: 30 pg (ref 26.0–34.0)
MCHC: 34.1 g/dL (ref 30.0–36.0)
MCV: 87.9 fL (ref 80.0–100.0)
Platelets: 229 10*3/uL (ref 150–400)
RBC: 4.47 MIL/uL (ref 3.87–5.11)
RDW: 12.1 % (ref 11.5–15.5)
WBC: 5.6 10*3/uL (ref 4.0–10.5)
nRBC: 0 % (ref 0.0–0.2)

## 2022-04-28 LAB — BASIC METABOLIC PANEL
Anion gap: 8 (ref 5–15)
BUN: 14 mg/dL (ref 6–20)
CO2: 26 mmol/L (ref 22–32)
Calcium: 10.5 mg/dL — ABNORMAL HIGH (ref 8.9–10.3)
Chloride: 106 mmol/L (ref 98–111)
Creatinine, Ser: 1.21 mg/dL — ABNORMAL HIGH (ref 0.44–1.00)
GFR, Estimated: 52 mL/min — ABNORMAL LOW (ref >60)
Glucose, Bld: 104 mg/dL — ABNORMAL HIGH (ref 70–99)
Potassium: 4.1 mmol/L (ref 3.5–5.1)
Sodium: 140 mmol/L (ref 135–145)

## 2022-04-28 LAB — BRAIN NATRIURETIC PEPTIDE: B Natriuretic Peptide: 24.3 pg/mL (ref 0.0–100.0)

## 2022-04-28 LAB — TROPONIN I (HIGH SENSITIVITY)
Troponin I (High Sensitivity): 6 ng/L (ref <18)
Troponin I (High Sensitivity): 5 ng/L (ref <18)

## 2022-04-28 LAB — HCG, SERUM, QUALITATIVE: Preg, Serum: NEGATIVE

## 2022-04-28 MED ORDER — OMEPRAZOLE 20 MG PO CPDR
40.0000 mg | DELAYED_RELEASE_CAPSULE | Freq: Every day | ORAL | 0 refills | Status: DC
  Filled 2022-04-28: qty 60, 30d supply, fill #0

## 2022-04-28 MED ORDER — ALUM & MAG HYDROXIDE-SIMETH 200-200-20 MG/5ML PO SUSP
30.0000 mL | Freq: Once | ORAL | Status: AC
  Administered 2022-04-28: 30 mL via ORAL
  Filled 2022-04-28: qty 30

## 2022-04-28 MED ORDER — LIDOCAINE VISCOUS HCL 2 % MT SOLN
15.0000 mL | Freq: Once | OROMUCOSAL | Status: AC
  Administered 2022-04-28: 15 mL via ORAL
  Filled 2022-04-28: qty 15

## 2022-04-28 NOTE — ED Triage Notes (Signed)
Pt arrives to ED with c/o chest pain that started x1 week ago. Pt reports she has been experiencing intermittent chest pain that she describes as pressure/fullness. The pain radiates to her left arm.

## 2022-04-28 NOTE — ED Provider Notes (Signed)
[facility] EMERGENCY DEPT Provider Note   CSN: [id number] Arrival date & time: 04/28/22  7782     History  Chief Complaint  Patient presents with   Chest Pain    Bonnie Martin is a 61 y.o. female.  61 year old female with past medical history that is significant for prior MI, hypertension presents today for evaluation of chest fullness/pressure intermittently present for the past week, left shoulder pain, and today pain in the left upper extremity.  Currently denies chest pressure.  States this resolved after taking a full dose aspirin this morning.  Patient was seen for the symptoms by her PCP last week and was referred to cardiologist who she followed up with last Wednesday.  She states she had a poor experience during the cardiology visit and does not plan to return to the clinic for additional care.  She was planning to reach out for another referral.  She states they were planning on doing some blood work, and echocardiogram which have not been completed as of yet.  Her last MI was about 20 years ago when she was in her mid 18s.  She states she had a catheterization done at that time but no obstructive CAD.  States she had lots of stressors at the time.  Reports CAD in her sister who passed away in her 33s.  She also had history of acid reflux symptoms previously stopped taking her medications.  Denies shortness of breath, or orthopnea.  Reports peripheral edema since last week which is why she initially went to see her PCP.   I am unable to access patient's cardiology visit through care everywhere.  However patient was able to pull this out for me through her MyChart.  Patient was prescribed Ranexa, and Imdur, with plans of further evaluation with blood work, echocardiogram.  If pain persisted plans were for cardiac catheterization.  Patient has not started to take Ranexa and Imdur and does not plan to do so given her poor experience with cardiology clinic.   The  history is provided by the patient. No language interpreter was used.       Home Medications Prior to Admission medications   Medication Sig Start Date End Date Taking? Authorizing Provider  allopurinol (ZYLOPRIM) 100 MG tablet Take 1 tablet by mouth daily. 10/15/16   [provider]  amLODipine (NORVASC) 10 MG tablet Take 1 tablet by mouth daily. 10/21/16 10/21/17  [provider]  furosemide (LASIX) 20 MG tablet Take 1 tablet by mouth 2 (two) times daily. 06/19/16 06/19/17  [provider]  ibuprofen (ADVIL) 800 MG tablet Take 1 tablet (800 mg total) by mouth 3 (three) times daily. Take with food 06/10/21   Triplett, Tammy, PA-C  lansoprazole (PREVACID) 30 MG capsule Take 1 capsule by mouth daily. 05/21/16 05/21/17  [provider]  levothyroxine (SYNTHROID, LEVOTHROID) 125 MCG tablet Take 1 tablet by mouth daily. 05/06/16   [provider]  losartan (COZAAR) 100 MG tablet Take 1 tablet by mouth daily. 06/19/16 06/19/17  [provider]  methocarbamol (ROBAXIN) 500 MG tablet Take 1 tablet (500 mg total) by mouth 3 (three) times daily. Medication may cause drowsiness 06/10/21   Triplett, Tammy, PA-C  metoprolol succinate (TOPROL-XL) 100 MG 24 hr tablet Take 1 tablet by mouth daily. 04/22/16   [provider]  oxyCODONE-acetaminophen (PERCOCET/ROXICET) 5-325 MG tablet Take 1-2 tablets by mouth every 8 (eight) hours as needed for severe pain. 02/12/22   Benjiman Core, MD  Allergies    Shellfish allergy and Sulfonamide derivatives    Review of Systems   Review of Systems  Constitutional:  Negative for chills and fever.  Respiratory:  Negative for shortness of breath.   Cardiovascular:  Positive for chest pain and leg swelling. Negative for palpitations.  Gastrointestinal:  Negative for abdominal pain, nausea and vomiting.  Neurological:  Negative for light-headedness.  All other systems reviewed and are negative.   Physical  Exam Updated Vital Signs BP (!) 157/72 (BP Location: Right Arm)   Pulse 60   Temp 98.6 F (37 C) (Oral)   Resp 13   Ht 5\' 4"  (1.626 m)   Wt (!) 138.3 kg   SpO2 100%   BMI 52.35 kg/m  Physical Exam Vitals and nursing note reviewed.  Constitutional:      General: She is not in acute distress.    Appearance: Normal appearance. She is not ill-appearing.  HENT:     Head: Normocephalic and atraumatic.     Nose: Nose normal.  Eyes:     General: No scleral icterus.    Extraocular Movements: Extraocular movements intact.     Conjunctiva/sclera: Conjunctivae normal.  Cardiovascular:     Rate and Rhythm: Normal rate and regular rhythm.     Pulses: Normal pulses.  Pulmonary:     Effort: Pulmonary effort is normal. No respiratory distress.     Breath sounds: Normal breath sounds. No wheezing or rales.  Musculoskeletal:        General: Normal range of motion.     Cervical back: Normal range of motion.     Right lower leg: No edema.     Left lower leg: No edema.  Skin:    General: Skin is warm and dry.  Neurological:     General: No focal deficit present.     Mental Status: She is alert. Mental status is at baseline.     ED Results / Procedures / Treatments   Labs (all labs ordered are listed, but only abnormal results are displayed) Labs Reviewed  BASIC METABOLIC PANEL  CBC  BRAIN NATRIURETIC PEPTIDE  HCG, SERUM, QUALITATIVE  TROPONIN I (HIGH SENSITIVITY)    EKG None  Radiology DG Chest 2 View  Result Date: 04/28/2022 CLINICAL DATA:  Chest pain EXAM: CHEST - 2 VIEW COMPARISON:  Jan 21, 2005 FINDINGS: The heart size and mediastinal contours are within normal limits. Both lungs are clear. The visualized skeletal structures are unremarkable. IMPRESSION: No acute cardiopulmonary abnormality. Electronically Signed   By: Jan 23, 2005 M.D.   On: 04/28/2022 09:53    Procedures Procedures    Medications Ordered in ED Medications  alum & mag hydroxide-simeth  (MAALOX/MYLANTA) 200-200-20 MG/5ML suspension 30 mL (has no administration in time range)    And  lidocaine (XYLOCAINE) 2 % viscous mouth solution 15 mL (has no administration in time range)    ED Course/ Medical Decision Making/ A&P                           Medical Decision Making Amount and/or Complexity of Data Reviewed Labs: ordered. Radiology: ordered.  Risk OTC drugs. Prescription drug management.   Medical Decision Making / ED Course   This patient presents to the ED for concern of chest pain, this involves an extensive number of treatment options, and is a complaint that carries with it a high risk of complications and morbidity.  The differential diagnosis includes ACS, PE, pneumonia,  MSK pain, GERD  MDM: 61 year old female with past medical history significant for prior MI, hypertension presents today for evaluation of chest fullness, pressure ongoing for the past week intermittently.  Today she also had left arm pain.  Takes omeprazole as needed.  Took aspirin this morning.  Was seen by cardiology last week but due to poor experience has not taken Imdur, Ranexa.  Did not schedule echocardiogram, or blood work.  Would like a referral to a different cardiologist.  Will evaluate in the emergency room for acute ACS. Chest x-ray without acute cardiopulmonary process.  Low risk for PE on Wells criteria.  She is without tachycardia, tachypnea, or hypoxia.  Doubt PE.  CBC unremarkable.  BMP with creatinine 1.21 otherwise without acute findings.  Initial troponin of 6.  BNP of 24.  EKG without acute ischemic changes.  Will await second troponin.  If this is normal will provide cardiology referral and follow-up with PCP.  Will refill patient's omeprazole and advised to take this consistently. Repeat troponin 5.  Doubt ACS.  Although her history and story is typical in these a cardiology follow-up for further work-up management.  Will provide patient cardiology referral.  Return  precautions discussed.  Patient voices understanding and is in agreement with plan.  Discussed follow-up with PCP.  Omeprazole prescribed. Discussion had regarding inpatient work-up more versus outpatient.  Patient agrees with outpatient management and will return for any worsening symptoms.   Additional history obtained: -Additional history obtained from Per patient's chart review of her recent visit to cardiology at [facility] -External records from outside source obtained and reviewed including: Chart review including previous notes, labs, imaging, consultation notes   Lab Tests: -I ordered, reviewed, and interpreted labs.   The pertinent results include:   Labs Reviewed  BASIC METABOLIC PANEL - Abnormal; Notable for the following components:      Result Value   Glucose, Bld 104 (*)    Creatinine, Ser 1.21 (*)    Calcium 10.5 (*)    GFR, Estimated 52 (*)    All other components within normal limits  CBC  BRAIN NATRIURETIC PEPTIDE  HCG, SERUM, QUALITATIVE  TROPONIN I (HIGH SENSITIVITY)  TROPONIN I (HIGH SENSITIVITY)      EKG  EKG Interpretation  Date/Time:    Ventricular Rate:    PR Interval:    QRS Duration:   QT Interval:    QTC Calculation:   R Axis:     Text Interpretation:           Imaging Studies ordered: I ordered imaging studies including chest x-ray  I independently visualized and interpreted imaging. I agree with the radiologist interpretation   Medicines ordered and prescription drug management: Meds ordered this encounter  Medications   AND Linked Order Group    alum & mag hydroxide-simeth (MAALOX/MYLANTA) 200-200-20 MG/5ML suspension 30 mL    lidocaine (XYLOCAINE) 2 % viscous mouth solution 15 mL    -I have reviewed the patients home medicines and have made adjustments as needed  Reevaluation: After the interventions noted above, I reevaluated the patient and found that they have :improved  Co morbidities that complicate the patient  evaluation  Past Medical History:  Diagnosis Date   Hypertension    Thyroid disease       Dispostion: Patient discharged in stable condition.  Return precautions discussed.  Patient voices understanding and is in agreement with plan.  Patient following GI cocktail remained without chest pain, or left arm pain.  Patient since  taken aspirin this morning has not had any recurrent chest pain.  Presented to the emergency room with left arm pain which has resolved since GI cocktail.  Will prescribe omeprazole but discussed importance of cardiology and PCP follow-up.  Final Clinical Impression(s) / ED Diagnoses Final diagnoses:  Precordial chest pain    Rx / DC Orders ED Discharge Orders          Ordered    Ambulatory referral to Cardiology       Comments: If you have not heard from the Cardiology office within the next 72 hours please call [phone].   04/28/22 1328    omeprazole (PRILOSEC) 20 MG capsule  Daily        04/28/22 1329              Marita Kansas, PA-C 04/28/22 1330    Tegeler, Canary Brim, MD 04/28/22 1627

## 2022-04-28 NOTE — Discharge Instructions (Addendum)
Your work-up today was reassuring against evidence of active heart attack.  Please follow-up with cardiologist.  I have provided you with a cardiology referral.  If they do not call you in the next day or 2 to schedule this appointment please call to get this scheduled.  Please follow-up with your primary care provider.  If you have worsening chest pain, shortness of breath please return to the emergency room for evaluation.

## 2022-04-29 ENCOUNTER — Ambulatory Visit: Payer: BC Managed Care – PPO | Admitting: Interventional Cardiology

## 2022-04-29 ENCOUNTER — Encounter: Payer: Self-pay | Admitting: Interventional Cardiology

## 2022-04-29 VITALS — BP 124/78 | HR 60 | Ht 64.0 in | Wt 309.0 lb

## 2022-04-29 DIAGNOSIS — R072 Precordial pain: Secondary | ICD-10-CM

## 2022-04-29 DIAGNOSIS — I10 Essential (primary) hypertension: Secondary | ICD-10-CM

## 2022-04-29 DIAGNOSIS — R600 Localized edema: Secondary | ICD-10-CM

## 2022-04-29 NOTE — Progress Notes (Signed)
Cardiology Office Note   Date:  04/29/2022   ID:  Bonnie Martin, DOB 1961/09/10, MRN [id number]  PCP:  Elizabeth Palau, FNP    Chief Complaint  Patient presents with   New Patient (Initial Visit)   Chest discomfort  Wt Readings from Last 3 Encounters:  04/29/22 (!) 309 lb (140.2 kg)  04/28/22 (!) 305 lb (138.3 kg)  02/12/22 298 lb (135.2 kg)       History of Present Illness: Bonnie Martin is a 61 y.o. female who is being seen today for the evaluation of chest pain at the request of Marita Kansas, PA-C.   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   She has some chest discomfort over the past few weeks.  She was seen by another cardiologist but not satisfied with the experience.  She was started on several cardiac medicines including ranolazine, isosorbide and rosuvastatin.   Over the past few days, she has had some chest fullness.  She went to the ER.  Prior MI in 2002 presented with pain in her back.  THis is a different sx. She also has GERD.  In the ER, she had significant relief from the GI cocktail.   She had a fall in 5/23.  She fell forward and hurt her right wrist and left shoulder.    She is concerned that her discomfort is related to her heart and would like to get some kind of testing done.  Denies :  Dizziness. Leg edema. Nitroglycerin use. Orthopnea. Palpitations. Paroxysmal nocturnal dyspnea. Syncope.      Past Medical History:  Diagnosis Date   Hypertension    Thyroid disease     Past Surgical History:  Procedure Laterality Date   ABDOMINAL HYSTERECTOMY     BREAST EXCISIONAL BIOPSY Left    THYROIDECTOMY, PARTIAL Left      Current Outpatient Medications  Medication Sig Dispense Refill   allopurinol (ZYLOPRIM) 300 MG tablet Take 300 mg by mouth daily.     colchicine 0.6 MG tablet Take by mouth as directed.     halobetasol (ULTRAVATE) 0.05 %  cream Apply topically 2 (two) times daily as needed.     ibuprofen (ADVIL) 800 MG tablet Take 1 tablet (800 mg total) by mouth 3 (three) times daily. Take with food 21 tablet 0   isosorbide mononitrate (IMDUR) 30 MG 24 hr tablet Take 30 mg by mouth daily.     levothyroxine (SYNTHROID) 100 MCG tablet Take 100 mcg by mouth daily.     methocarbamol (ROBAXIN) 500 MG tablet Take 1 tablet (500 mg total) by mouth 3 (three) times daily. Medication may cause drowsiness 21 tablet 0   metoprolol (TOPROL-XL) 200 MG 24 hr tablet Take 200 mg by mouth daily.     omeprazole (PRILOSEC) 20 MG capsule Take 2 capsules (40 mg total) by mouth daily. 60 capsule 0   oxyCODONE-acetaminophen (PERCOCET/ROXICET) 5-325 MG tablet Take 1-2 tablets by mouth every 8 (eight) hours as needed for severe pain. 6 tablet 0   ranolazine (RANEXA) 500 MG 12 hr tablet Take 500 mg by mouth 2 (two) times daily.     valsartan (DIOVAN) 320 MG tablet Take 320 mg by mouth daily.     amLODipine (NORVASC) 10 MG tablet Take 1 tablet by mouth daily.     furosemide (LASIX) 20 MG tablet Take 1 tablet by mouth 2 (two) times  daily.     losartan (COZAAR) 100 MG tablet Take 1 tablet by mouth daily.     No current facility-administered medications for this visit.    Allergies:   Shellfish allergy and Sulfonamide derivatives    Social History:  The patient  reports that she has never smoked. She has never used smokeless tobacco. She reports that she does not drink alcohol and does not use drugs.   Family History:  The patient's family history includes Breast cancer (age of onset: 36) in her sister; Heart disease in her father; Kidney disease in her mother.    ROS:  Please see the history of present illness.   Otherwise, review of systems are positive for chest pain.   All other systems are reviewed and negative.    PHYSICAL EXAM: VS:  BP 124/78   Pulse 77   Ht 5\' 4"  (1.626 m)   Wt (!) 309 lb (140.2 kg)   SpO2 98%   BMI 53.04 kg/m  , BMI Body  mass index is 53.04 kg/m. GEN: Well nourished, well developed, in no acute distress HEENT: normal Neck: no JVD, carotid bruits, or masses Cardiac: RRR; no murmurs, rubs, or gallops,; tr LE edema  Respiratory:  clear to auscultation bilaterally, normal work of breathing GI: soft, nontender, nondistended, + BS MS: no deformity or atrophy Skin: warm and dry, no rash Neuro:  Strength and sensation are intact Psych: euthymic mood, full affect   EKG:   The ekg ordered 04/28/22 demonstrates Normal ECG   Recent Labs: 04/28/2022: B Natriuretic Peptide 24.3; BUN 14; Creatinine, Ser 1.21; Hemoglobin 13.4; Platelets 229; Potassium 4.1; Sodium 140   Lipid Panel No results found for: "CHOL", "TRIG", "HDL", "CHOLHDL", "VLDL", "LDLCALC", "LDLDIRECT"   Other studies Reviewed: Additional studies/ records that were reviewed today with results demonstrating: ER records reviewed.   ASSESSMENT AND PLAN:  Chest pain: Some typical and some atypical features.  She had a heart cath many years ago which did not show significant disease.  Plan for coronary CTA.  Heart rate after she was sitting for a while was 60.  Asked her to continue her Toprol-XL 200 mg daily and take this prior to her coronary CTA.   Hypertension: Blood pressure well controlled.  Continue current medicines. Lower extremity swelling: Concern that amlodipine is playing a role in this.  Would first try leg elevation to see if this helps. Morbid obesity: This could affect the quality of any imaging study chosen whether it was nuclear study versus CT.  We also discussed calcium scoring CT but she wants a more detailed scan done.  Negative ER work-up.  She is hoping to avoid any invasive testing.   Current medicines are reviewed at length with the patient today.  The patient concerns regarding her medicines were addressed.  The following changes have been made:  No change  Labs/ tests ordered today include: CTA No orders of the defined  types were placed in this encounter.   Recommend 150 minutes/week of aerobic exercise Low fat, low carb, high fiber diet recommended  Disposition:   FU based on CT results   Signed, 04/30/2022, MD  04/29/2022 4:05 PM    [facility] [address] [place], [place], [place]  [place] Phone: [phone]; Fax: [phone]

## 2022-04-29 NOTE — Patient Instructions (Addendum)
Medication Instructions:  Your physician recommends that you continue on your current medications as directed. Please refer to the Current Medication list given to you today.  *If you need a refill on your cardiac medications before your next appointment, please call your pharmacy*  Testing/Procedures: Cardiac CT    Follow-Up: At [facility], you and your health needs are our priority.  As part of our continuing mission to provide you with exceptional heart care, we have created designated Provider Care Teams.  These Care Teams include your primary Cardiologist (physician) and Advanced Practice Providers (APPs -  Physician Assistants and Nurse Practitioners) who all work together to provide you with the care you need, when you need it.  We recommend signing up for the patient portal called "MyChart".  Sign up information is provided on this After Visit Summary.  MyChart is used to connect with patients for Virtual Visits (Telemedicine).  Patients are able to view lab/test results, encounter notes, upcoming appointments, etc.  Non-urgent messages can be sent to your provider as well.   To learn more about what you can do with MyChart, go to [email or web address].    Your next appointment:   As needed.   The format for your next appointment:   In Person  Provider:   Dr. Eldridge Dace     Other Instructions   Your cardiac CT will be scheduled at one of the below locations:   [facility] [address] [place], [place] [address] [phone]    If scheduled at [facility], please arrive at the [facility] (Entrance C2) of [facility] 30 minutes prior to test start time. You can use the FREE valet parking offered at entrance C (encouraged to control the heart rate for the test)  Proceed to the [facility] Radiology Department (first floor) to check-in and test prep.  All radiology patients and guests should use entrance C2 at [facility], accessed from [facility], even though the hospital's physical address listed is [address]      Please follow these instructions carefully (unless otherwise directed):   On the Night Before the Test: Be sure to Drink plenty of water. Do not consume any caffeinated/decaffeinated beverages or chocolate 12 hours prior to your test. Do not take any antihistamines 12 hours prior to your test.  On the Day of the Test: Drink plenty of water until 1 hour prior to the test. Do not eat any food 4 hours prior to the test. You may take your regular medications prior to the test.  Take metoprolol (Lopressor) two hours prior to test. HOLD Furosemide/Hydrochlorothiazide morning of the test. FEMALES- please wear underwire-free bra if available, avoid dresses & tight clothing    After the Test: Drink plenty of water. After receiving IV contrast, you may experience a mild flushed feeling. This is normal. On occasion, you may experience a mild rash up to 24 hours after the test. This is not dangerous. If this occurs, you can take Benadryl 25 mg and increase your fluid intake. If you experience trouble breathing, this can be serious. If it is severe call 911 IMMEDIATELY. If it is mild, please call our office. If you take any of these medications: Glipizide/Metformin, Avandament, Glucavance, please do not take 48 hours after completing test unless otherwise instructed.  We will call to schedule your test 2-4 weeks out understanding that some insurance companies will need an authorization prior to the service being  performed.   For non-scheduling related questions, please contact the cardiac imaging nurse navigator should you have any questions/concerns: Rockwell Alexandria, Cardiac Imaging Nurse Navigator Larey Brick, Cardiac Imaging Nurse Navigator [facility] Direct Office Dial: [phone]   For scheduling needs, including  cancellations and rescheduling, please call [place], [phone].

## 2022-07-18 ENCOUNTER — Other Ambulatory Visit: Payer: Self-pay

## 2022-07-18 ENCOUNTER — Encounter (HOSPITAL_BASED_OUTPATIENT_CLINIC_OR_DEPARTMENT_OTHER): Payer: Self-pay

## 2022-07-18 ENCOUNTER — Emergency Department (HOSPITAL_BASED_OUTPATIENT_CLINIC_OR_DEPARTMENT_OTHER)
Admission: EM | Admit: 2022-07-18 | Discharge: 2022-07-19 | Disposition: A | Discharge status: Home / Self Care | Payer: BC Managed Care – PPO | Attending: Emergency Medicine | Admitting: Emergency Medicine

## 2022-07-18 DIAGNOSIS — R7309 Other abnormal glucose: Secondary | ICD-10-CM

## 2022-07-18 DIAGNOSIS — R609 Edema, unspecified: Secondary | ICD-10-CM

## 2022-07-18 DIAGNOSIS — R739 Hyperglycemia, unspecified: Secondary | ICD-10-CM | POA: Insufficient documentation

## 2022-07-18 DIAGNOSIS — I10 Essential (primary) hypertension: Secondary | ICD-10-CM | POA: Diagnosis not present

## 2022-07-18 DIAGNOSIS — E039 Hypothyroidism, unspecified: Secondary | ICD-10-CM | POA: Diagnosis not present

## 2022-07-18 DIAGNOSIS — Z79899 Other long term (current) drug therapy: Secondary | ICD-10-CM | POA: Insufficient documentation

## 2022-07-18 DIAGNOSIS — R600 Localized edema: Secondary | ICD-10-CM | POA: Diagnosis not present

## 2022-07-18 DIAGNOSIS — M7989 Other specified soft tissue disorders: Secondary | ICD-10-CM | POA: Diagnosis present

## 2022-07-18 DIAGNOSIS — N289 Disorder of kidney and ureter, unspecified: Secondary | ICD-10-CM | POA: Insufficient documentation

## 2022-07-18 NOTE — ED Provider Notes (Incomplete)
[facility] EMERGENCY DEPT Provider Note   CSN: [id number] Arrival date & time: 07/18/22  1910     History {Add pertinent medical, surgical, social history, OB history to HPI:1} Chief Complaint  Patient presents with  . Leg Swelling    Bonnie Martin is a 61 y.o. female.  The history is provided by the patient.  She has history of hypertension, hypothyroidism   Home Medications Prior to Admission medications   Medication Sig Start Date End Date Taking? Authorizing Provider  allopurinol (ZYLOPRIM) 300 MG tablet Take 300 mg by mouth daily. 03/20/22   [provider]  amLODipine (NORVASC) 10 MG tablet Take 1 tablet by mouth daily. 10/21/16 10/21/17  [provider]  colchicine 0.6 MG tablet Take by mouth as directed. 12/30/21   [provider]  furosemide (LASIX) 20 MG tablet Take 1 tablet by mouth 2 (two) times daily. 06/19/16 06/19/17  [provider]  halobetasol (ULTRAVATE) 0.05 % cream Apply topically 2 (two) times daily as needed. 12/30/21   [provider]  ibuprofen (ADVIL) 800 MG tablet Take 1 tablet (800 mg total) by mouth 3 (three) times daily. Take with food 06/10/21   Triplett, Tammy, PA-C  isosorbide mononitrate (IMDUR) 30 MG 24 hr tablet Take 30 mg by mouth daily. 04/23/22   [provider]  levothyroxine (SYNTHROID) 100 MCG tablet Take 100 mcg by mouth daily. 03/20/22   [provider]  losartan (COZAAR) 100 MG tablet Take 1 tablet by mouth daily. 06/19/16 06/19/17  [provider]  methocarbamol (ROBAXIN) 500 MG tablet Take 1 tablet (500 mg total) by mouth 3 (three) times daily. Medication may cause drowsiness 06/10/21   Triplett, Tammy, PA-C  metoprolol (TOPROL-XL) 200 MG 24 hr tablet Take 200 mg by mouth daily. 03/21/22   [provider]  omeprazole (PRILOSEC) 20 MG capsule Take 2 capsules (40 mg total) by mouth daily. 04/28/22 05/28/22  Marita Kansas, PA-C  oxyCODONE-acetaminophen  (PERCOCET/ROXICET) 5-325 MG tablet Take 1-2 tablets by mouth every 8 (eight) hours as needed for severe pain. 02/12/22   Benjiman Core, MD  ranolazine (RANEXA) 500 MG 12 hr tablet Take 500 mg by mouth 2 (two) times daily. 04/23/22   [provider]  valsartan (DIOVAN) 320 MG tablet Take 320 mg by mouth daily. 03/05/22   [provider]      Allergies    Shellfish allergy and Sulfonamide derivatives    Review of Systems   Review of Systems  All other systems reviewed and are negative.   Physical Exam Updated Vital Signs BP (!) 143/74 (BP Location: Right Arm)   Pulse 67   Temp 98.1 F (36.7 C)   Resp 18   Ht 5\' 4"  (1.626 m)   Wt (!) 140.2 kg   SpO2 100%   BMI 53.05 kg/m  Physical Exam Vitals and nursing note reviewed.   61 year old female, resting comfortably and in no acute distress. Vital signs are ***. Oxygen saturation is ***%, which is normal. Head is normocephalic and atraumatic. PERRLA, EOMI. Oropharynx is clear. Neck is nontender and supple without adenopathy or JVD. Back is nontender and there is no CVA tenderness. Lungs are clear without rales, wheezes, or rhonchi. Chest is nontender. Heart has regular rate and rhythm without murmur. Abdomen is soft, flat, nontender without masses or hepatosplenomegaly and peristalsis is normoactive. Extremities have no cyanosis or edema, full range of motion is present. Skin is warm and dry without rash. Neurologic: Mental status is  normal, cranial nerves are intact, there are no motor or sensory deficits.  ED Results / Procedures / Treatments   Labs (all labs ordered are listed, but only abnormal results are displayed) Labs Reviewed - No data to display  EKG None  Radiology No results found.  Procedures Procedures  {Document cardiac monitor, telemetry assessment procedure when appropriate:1}  Medications Ordered in ED Medications - No data to display  ED Course/ Medical Decision Making/ A&P                            Medical Decision Making  ***  {Document critical care time when appropriate:1} {Document review of labs and clinical decision tools ie heart score, Chads2Vasc2 etc:1}  {Document your independent review of radiology images, and any outside records:1} {Document your discussion with family members, caretakers, and with consultants:1} {Document social determinants of health affecting pt's care:1} {Document your decision making why or why not admission, treatments were needed:1} Final Clinical Impression(s) / ED Diagnoses Final diagnoses:  None    Rx / DC Orders ED Discharge Orders     None

## 2022-07-18 NOTE — ED Triage Notes (Signed)
Patient here POV from Home.  Endorses Bilateral Leg Swelling that began approximately 1 Week ago.   OTC Medication is Helpful. Due for Right Knee Replacement and states this may have occurred in the Past due to Gout.   NAD Noted during Triage. A&Ox4. GCS 15. Ambulatory.

## 2022-07-18 NOTE — ED Provider Notes (Signed)
[facility] EMERGENCY DEPT Provider Note   CSN: [id number] Arrival date & time: 07/18/22  1910     History  Chief Complaint  Patient presents with   Leg Swelling    Bonnie Martin is a 61 y.o. female.  The history is provided by the patient.  She has history of hypertension, hypothyroidism was in because of bilateral leg swelling over the last 6 months, worse over the last 2 weeks.  She came in today because her legs were painful.  She denies any trauma.  She does have right knee pain and knows that she is going to need a knee replacement at some point.  She denies chest pain, heaviness, tightness, pressure.  She denies any dyspnea.  Leg swelling does seem to be worse in the evening, better in the morning after sleeping at night.   Home Medications Prior to Admission medications   Medication Sig Start Date End Date Taking? Authorizing Provider  allopurinol (ZYLOPRIM) 300 MG tablet Take 300 mg by mouth daily. 03/20/22   [provider]  amLODipine (NORVASC) 10 MG tablet Take 1 tablet by mouth daily. 10/21/16 10/21/17  [provider]  colchicine 0.6 MG tablet Take by mouth as directed. 12/30/21   [provider]  furosemide (LASIX) 20 MG tablet Take 1 tablet by mouth 2 (two) times daily. 06/19/16 06/19/17  [provider]  halobetasol (ULTRAVATE) 0.05 % cream Apply topically 2 (two) times daily as needed. 12/30/21   [provider]  ibuprofen (ADVIL) 800 MG tablet Take 1 tablet (800 mg total) by mouth 3 (three) times daily. Take with food 06/10/21   Triplett, Tammy, PA-C  isosorbide mononitrate (IMDUR) 30 MG 24 hr tablet Take 30 mg by mouth daily. 04/23/22   [provider]  levothyroxine (SYNTHROID) 100 MCG tablet Take 100 mcg by mouth daily. 03/20/22   [provider]  losartan (COZAAR) 100 MG tablet Take 1 tablet by mouth daily. 06/19/16 06/19/17  [provider]  methocarbamol (ROBAXIN) 500 MG tablet Take 1  tablet (500 mg total) by mouth 3 (three) times daily. Medication may cause drowsiness 06/10/21   Triplett, Tammy, PA-C  metoprolol (TOPROL-XL) 200 MG 24 hr tablet Take 200 mg by mouth daily. 03/21/22   [provider]  omeprazole (PRILOSEC) 20 MG capsule Take 2 capsules (40 mg total) by mouth daily. 04/28/22 05/28/22  Evlyn Courier, PA-C  oxyCODONE-acetaminophen (PERCOCET/ROXICET) 5-325 MG tablet Take 1-2 tablets by mouth every 8 (eight) hours as needed for severe pain. 02/12/22   Davonna Belling, MD  ranolazine (RANEXA) 500 MG 12 hr tablet Take 500 mg by mouth 2 (two) times daily. 04/23/22   [provider]  valsartan (DIOVAN) 320 MG tablet Take 320 mg by mouth daily. 03/05/22   [provider]      Allergies    Shellfish allergy and Sulfonamide derivatives    Review of Systems   Review of Systems  All other systems reviewed and are negative.   Physical Exam Updated Vital Signs BP (!) 143/74 (BP Location: Right Arm)   Pulse 67   Temp 98.1 F (36.7 C)   Resp 18   Ht 5\' 4"  (1.626 m)   Wt (!) 140.2 kg   SpO2 100%   BMI 53.05 kg/m  Physical Exam Vitals and nursing note reviewed.   61 year old female, resting comfortably and in no acute distress. Vital signs are normal. Oxygen saturation is 100%, which is normal. Head is normocephalic and atraumatic. PERRLA,  EOMI. Oropharynx is clear. Neck is nontender and supple without adenopathy or JVD. Back is nontender and there is no CVA tenderness.  There is no presacral edema. Lungs are clear without rales, wheezes, or rhonchi. Chest is nontender. Heart has regular rate and rhythm without murmur. Abdomen is soft, flat, nontender. Extremities have 2-3+ edema, full range of motion is present. Skin is warm and dry without rash. Neurologic: Mental status is normal, cranial nerves are intact, moves all extremities equally.  ED Results / Procedures / Treatments   Labs (all labs ordered are listed, but only abnormal results  are displayed) Labs Reviewed  COMPREHENSIVE METABOLIC PANEL - Abnormal; Notable for the following components:      Result Value   Glucose, Bld 108 (*)    Creatinine, Ser 1.20 (*)    Calcium 10.7 (*)    GFR, Estimated 52 (*)    All other components within normal limits  CBC WITH DIFFERENTIAL/PLATELET  BRAIN NATRIURETIC PEPTIDE  URINALYSIS, ROUTINE W REFLEX MICROSCOPIC   Radiology DG Chest 2 View  Result Date: 07/19/2022 CLINICAL DATA:  Bilateral leg swelling EXAM: CHEST - 2 VIEW COMPARISON:  04/28/2022 FINDINGS: Lungs are clear.  No pleural effusion or pneumothorax. The heart is normal in size. Visualized osseous structures are within normal limits. IMPRESSION: Normal chest radiographs. Electronically Signed   By: Charline Bills M.D.   On: 07/19/2022 00:25    Procedures Procedures    Medications Ordered in ED Medications - No data to display  ED Course/ Medical Decision Making/ A&P                           Medical Decision Making Amount and/or Complexity of Data Reviewed Labs: ordered. Radiology: ordered.  Risk Prescription drug management.   Peripheral edema.  Possible heart failure, possible hypoalbuminemia, possible venous insufficiency.  I have reviewed her past records, and on 04/29/2022, she saw a cardiologist who noted trace edema.  In care everywhere, albumin was normal (4.5) on 11/29/2021.  On 04/28/2022 a chest x-ray was normal.  I have ordered a chest x-ray to look for evidence of cardiomegaly, screening labs of CBC, comprehensive metabolic panel, BNP to look for evidence of heart failure or hypoalbuminemia or renal insufficiency.  With bilateral swelling present for months, I have a very low index of suspicion for DVT and do not feel she needs a DVT study.  Chest x-ray is normal, no evidence of cardiomegaly.  I have independently viewed the images, and agree with radiologist's interpretation.  I reviewed and interpreted her laboratory tests, and my interpretation is  mild hypercalcemia and mild renal insufficiency which are not significantly changed from baseline, minimally elevated random glucose level which will need to be followed as an outpatient.  She is currently taking a diuretic, but a relatively low dose.  I am ordering a prescription for a higher dose of furosemide.  I have recommended the patient stay on a low-salt diet and follow-up with her primary care provider.  Final Clinical Impression(s) / ED Diagnoses Final diagnoses:  Peripheral edema  Renal insufficiency  Hypercalcemia  Elevated random blood glucose level    Rx / DC Orders ED Discharge Orders          Ordered    furosemide (LASIX) 40 MG tablet  2 times daily        07/19/22 0117              Dione Booze, MD 07/19/22  0127  

## 2022-07-19 ENCOUNTER — Emergency Department (HOSPITAL_BASED_OUTPATIENT_CLINIC_OR_DEPARTMENT_OTHER): Payer: BC Managed Care – PPO | Admitting: Radiology

## 2022-07-19 LAB — COMPREHENSIVE METABOLIC PANEL
ALT: 17 U/L (ref 0–44)
AST: 24 U/L (ref 15–41)
Albumin: 4.4 g/dL (ref 3.5–5.0)
Alkaline Phosphatase: 77 U/L (ref 38–126)
Anion gap: 9 (ref 5–15)
BUN: 10 mg/dL (ref 6–20)
CO2: 25 mmol/L (ref 22–32)
Calcium: 10.7 mg/dL — ABNORMAL HIGH (ref 8.9–10.3)
Chloride: 105 mmol/L (ref 98–111)
Creatinine, Ser: 1.2 mg/dL — ABNORMAL HIGH (ref 0.44–1.00)
GFR, Estimated: 52 mL/min — ABNORMAL LOW (ref >60)
Glucose, Bld: 108 mg/dL — ABNORMAL HIGH (ref 70–99)
Potassium: 3.6 mmol/L (ref 3.5–5.1)
Sodium: 139 mmol/L (ref 135–145)
Total Bilirubin: 0.6 mg/dL (ref 0.3–1.2)
Total Protein: 7.3 g/dL (ref 6.5–8.1)

## 2022-07-19 LAB — URINALYSIS, ROUTINE W REFLEX MICROSCOPIC
Bilirubin Urine: NEGATIVE
Glucose, UA: NEGATIVE mg/dL
Hgb urine dipstick: NEGATIVE
Ketones, ur: NEGATIVE mg/dL
Leukocytes,Ua: NEGATIVE
Nitrite: NEGATIVE
Protein, ur: NEGATIVE mg/dL
Specific Gravity, Urine: 1.019 (ref 1.005–1.030)
pH: 5.5 (ref 5.0–8.0)

## 2022-07-19 LAB — CBC WITH DIFFERENTIAL/PLATELET
Abs Immature Granulocytes: 0.03 10*3/uL (ref 0.00–0.07)
Basophils Absolute: 0.1 10*3/uL (ref 0.0–0.1)
Basophils Relative: 1 %
Eosinophils Absolute: 0.3 10*3/uL (ref 0.0–0.5)
Eosinophils Relative: 4 %
HCT: 39.1 % (ref 36.0–46.0)
Hemoglobin: 13 g/dL (ref 12.0–15.0)
Immature Granulocytes: 0 %
Lymphocytes Relative: 35 %
Lymphs Abs: 2.9 10*3/uL (ref 0.7–4.0)
MCH: 29.1 pg (ref 26.0–34.0)
MCHC: 33.2 g/dL (ref 30.0–36.0)
MCV: 87.7 fL (ref 80.0–100.0)
Monocytes Absolute: 0.7 10*3/uL (ref 0.1–1.0)
Monocytes Relative: 8 %
Neutro Abs: 4.3 10*3/uL (ref 1.7–7.7)
Neutrophils Relative %: 52 %
Platelets: 265 10*3/uL (ref 150–400)
RBC: 4.46 MIL/uL (ref 3.87–5.11)
RDW: 12 % (ref 11.5–15.5)
WBC: 8.2 10*3/uL (ref 4.0–10.5)
nRBC: 0 % (ref 0.0–0.2)

## 2022-07-19 LAB — BRAIN NATRIURETIC PEPTIDE: B Natriuretic Peptide: 14.6 pg/mL (ref 0.0–100.0)

## 2022-07-19 MED ORDER — FUROSEMIDE 40 MG PO TABS: 40.0000 mg | ORAL_TABLET | Freq: Two times a day (BID) | ORAL | 0 refills | Status: DC

## 2022-07-19 NOTE — Discharge Instructions (Addendum)
Stay on a low-salt diet.  Please weigh yourself every day.  As your weight goes up or down, it reflects gain or loss in total body fluid.  You will need to work with your primary care provider to adjust your dose of furosemide once the fluid has come off.

## 2022-10-07 IMAGING — DX DG HAND COMPLETE 3+V*R*
3 series · 3 of 3 positions shown · non-contrast
Comparison: None Available.

CLINICAL DATA: injury, fall

EXAM:
RIGHT HAND - COMPLETE 3+ VIEW; RIGHT WRIST - COMPLETE 3+ VIEW

[hand ap]
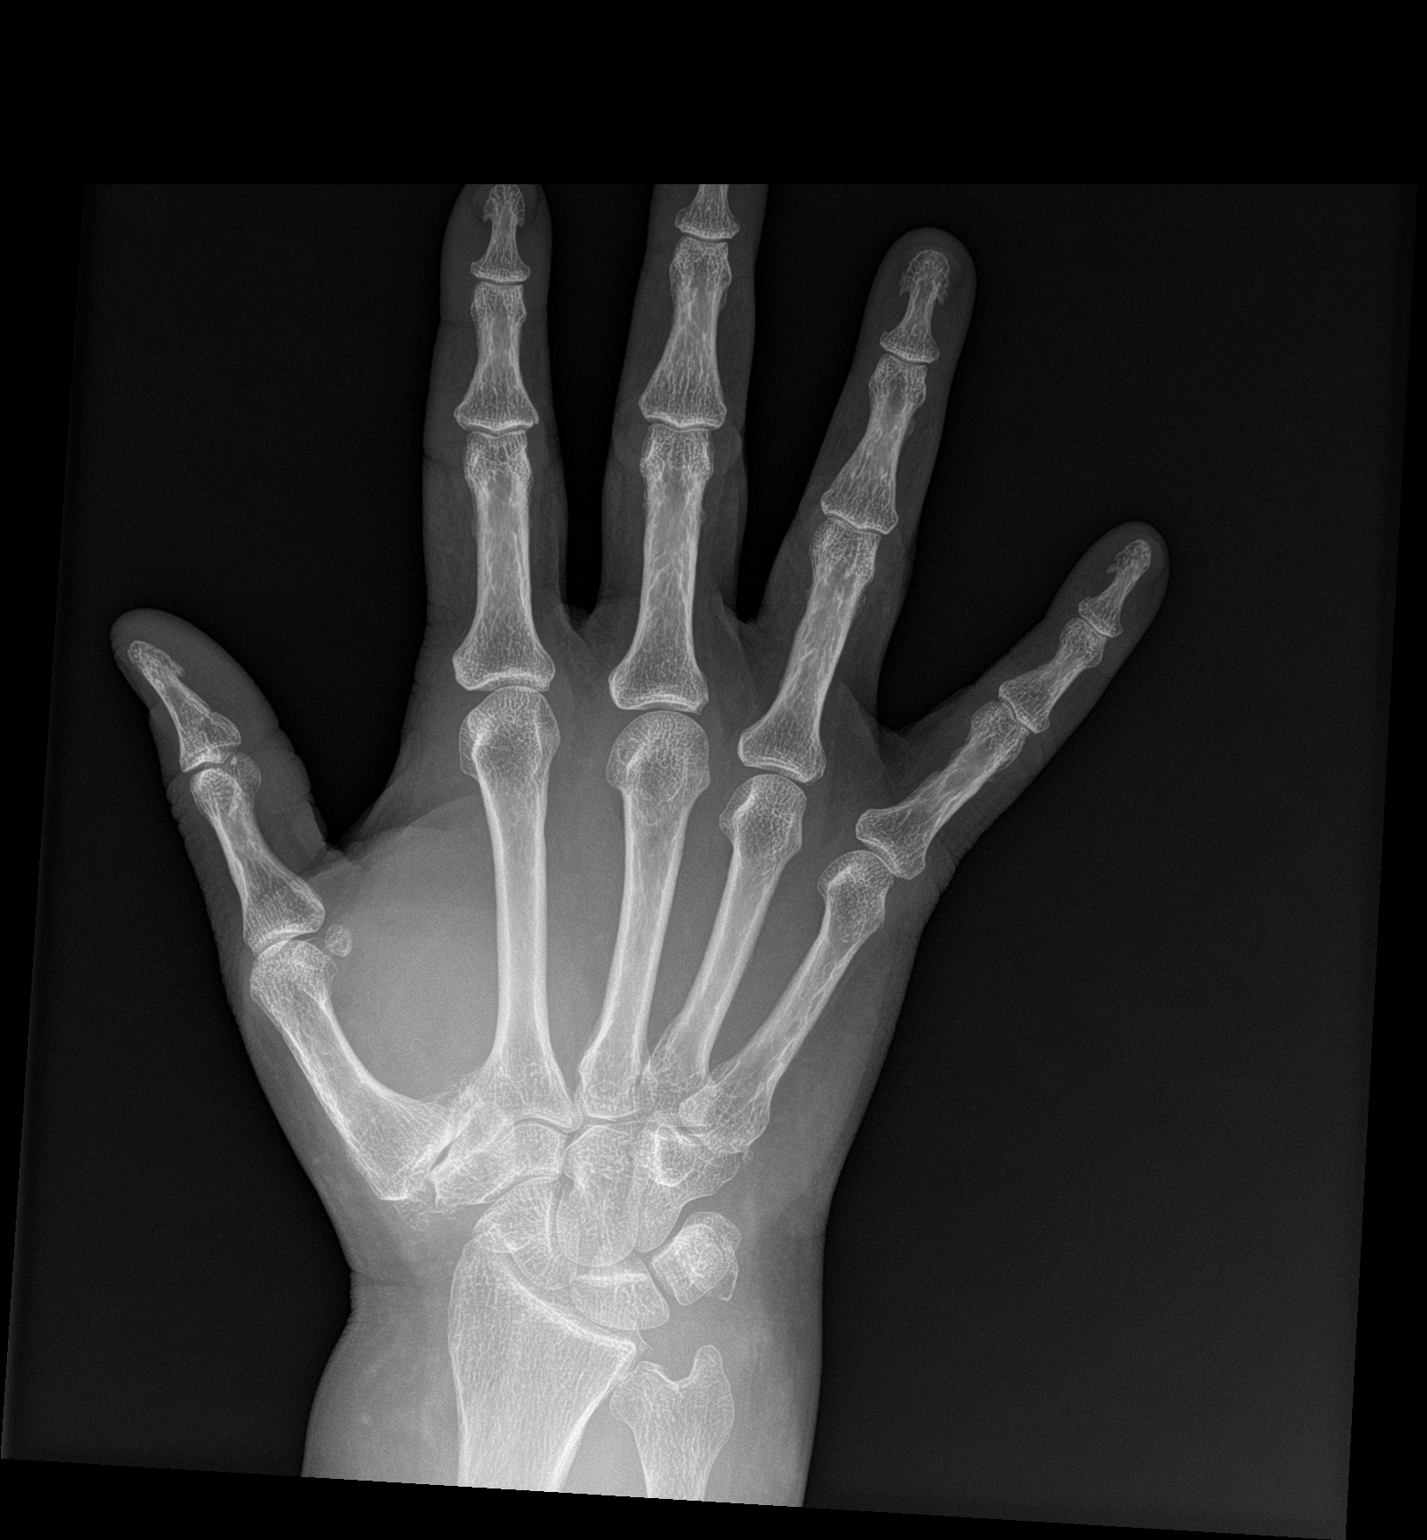

[hand obl]
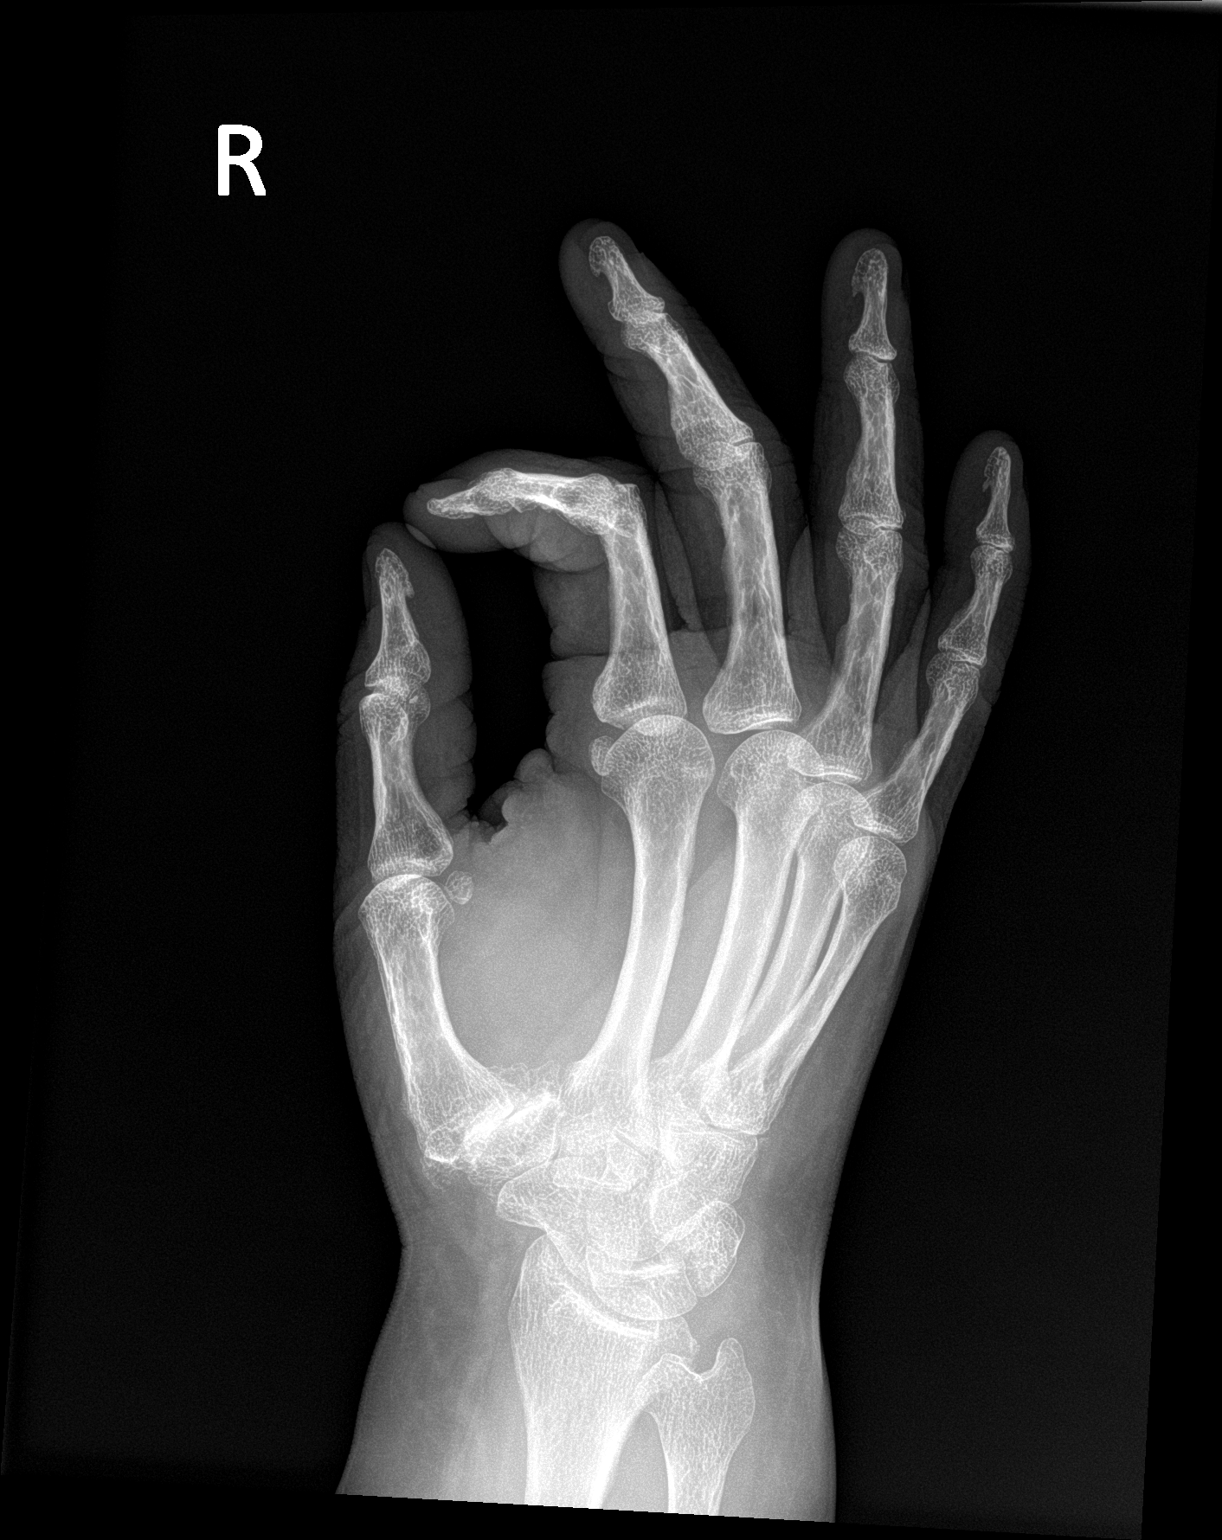

[hand lat]
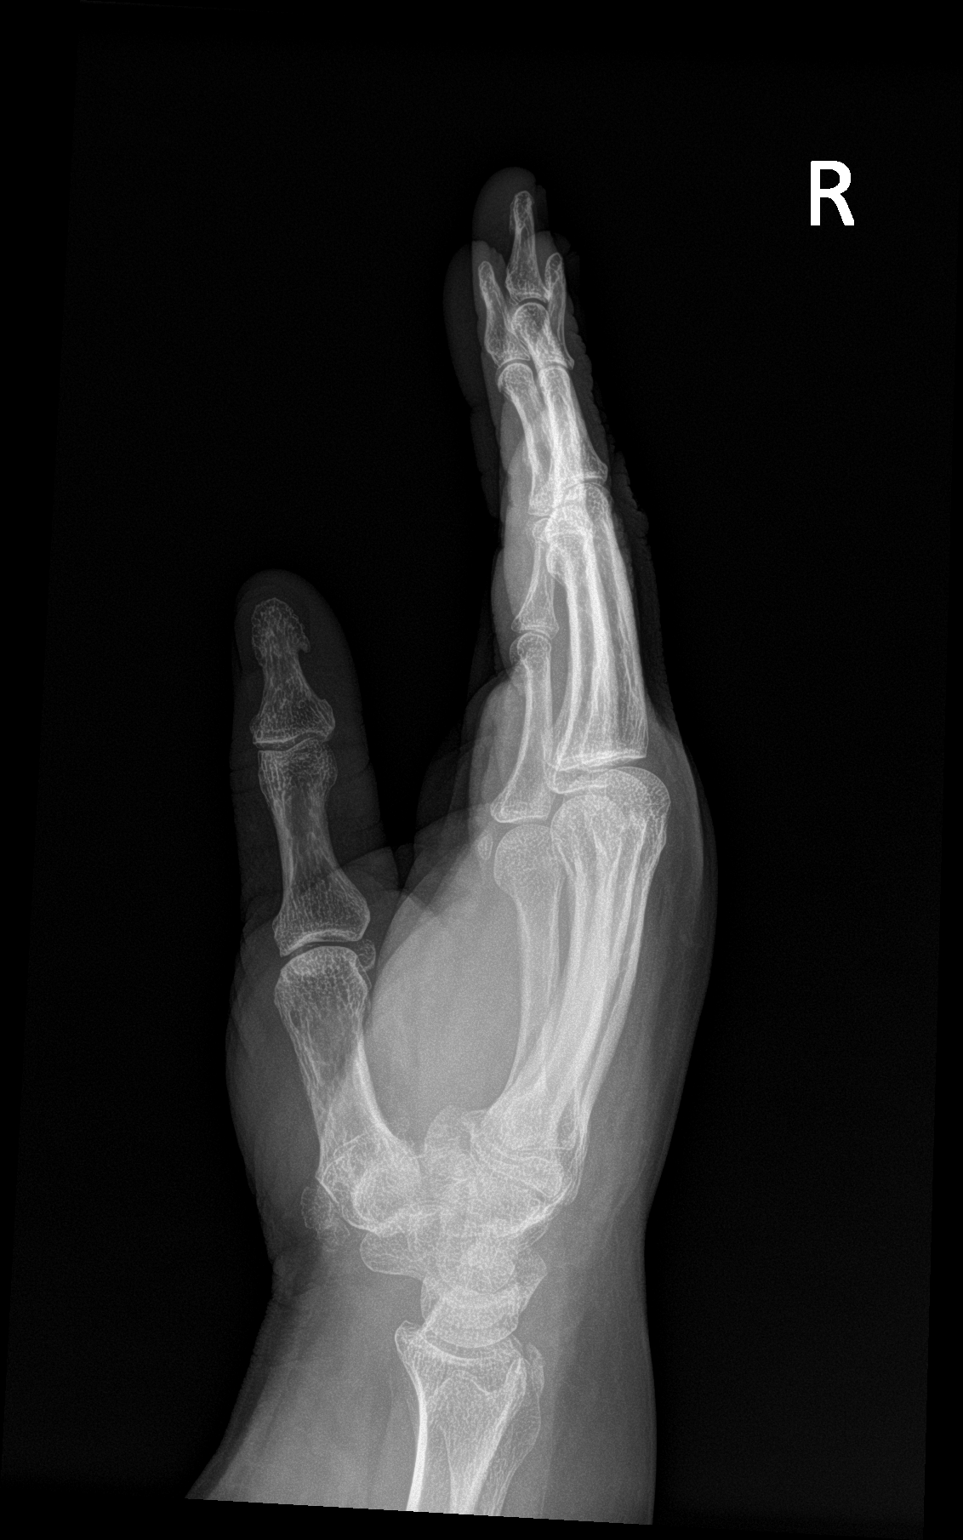

[3 of 3 positions shown; findings below may reference images not displayed]

FINDINGS: There is a suspected minimally displaced fracture along the dorsal
aspect of the distal radius near the radiocarpal joint. There is
moderate to severe first carpometacarpal joint osteoarthritis. Mild
negative ulnar variance. There is no evidence of additional fracture
in the hand.
IMPRESSION: Suspected mildly displaced fracture along the dorsal aspect of the
distal radius near the radiocarpal joint. No evidence of additional
fracture in the hand.

Moderate to severe first carpometacarpal joint osteoarthritis.

## 2023-03-20 ENCOUNTER — Other Ambulatory Visit: Payer: Self-pay | Admitting: Family

## 2023-03-20 DIAGNOSIS — Z1231 Encounter for screening mammogram for malignant neoplasm of breast: Secondary | ICD-10-CM

## 2023-03-31 ENCOUNTER — Ambulatory Visit
Admission: RE | Admit: 2023-03-31 | Discharge: 2023-03-31 | Disposition: A | Discharge status: Home / Self Care | Payer: BC Managed Care – PPO | Source: Ambulatory Visit | Attending: Family | Admitting: Family

## 2023-03-31 DIAGNOSIS — Z1231 Encounter for screening mammogram for malignant neoplasm of breast: Secondary | ICD-10-CM

## 2023-09-21 ENCOUNTER — Encounter (HOSPITAL_BASED_OUTPATIENT_CLINIC_OR_DEPARTMENT_OTHER): Payer: Self-pay | Admitting: Emergency Medicine

## 2023-09-21 ENCOUNTER — Inpatient Hospital Stay (HOSPITAL_BASED_OUTPATIENT_CLINIC_OR_DEPARTMENT_OTHER)
Admission: EM | Admit: 2023-09-21 | Discharge: 2023-09-23 | DRG: 281 | Disposition: A | Discharge status: Home / Self Care | Payer: 59 | Attending: Cardiology | Admitting: Cardiology

## 2023-09-21 ENCOUNTER — Other Ambulatory Visit: Payer: Self-pay

## 2023-09-21 ENCOUNTER — Emergency Department (HOSPITAL_BASED_OUTPATIENT_CLINIC_OR_DEPARTMENT_OTHER): Payer: BLUE CROSS/BLUE SHIELD | Admitting: Radiology

## 2023-09-21 DIAGNOSIS — Z8249 Family history of ischemic heart disease and other diseases of the circulatory system: Secondary | ICD-10-CM

## 2023-09-21 DIAGNOSIS — Z7982 Long term (current) use of aspirin: Secondary | ICD-10-CM

## 2023-09-21 DIAGNOSIS — I214 Non-ST elevation (NSTEMI) myocardial infarction: Secondary | ICD-10-CM | POA: Diagnosis not present

## 2023-09-21 DIAGNOSIS — Z90710 Acquired absence of both cervix and uterus: Secondary | ICD-10-CM

## 2023-09-21 DIAGNOSIS — I10 Essential (primary) hypertension: Secondary | ICD-10-CM | POA: Diagnosis present

## 2023-09-21 DIAGNOSIS — Z841 Family history of disorders of kidney and ureter: Secondary | ICD-10-CM

## 2023-09-21 DIAGNOSIS — Z91013 Allergy to seafood: Secondary | ICD-10-CM

## 2023-09-21 DIAGNOSIS — N1831 Chronic kidney disease, stage 3a: Secondary | ICD-10-CM | POA: Diagnosis present

## 2023-09-21 DIAGNOSIS — Z79890 Hormone replacement therapy (postmenopausal): Secondary | ICD-10-CM

## 2023-09-21 DIAGNOSIS — M109 Gout, unspecified: Secondary | ICD-10-CM | POA: Diagnosis present

## 2023-09-21 DIAGNOSIS — E039 Hypothyroidism, unspecified: Secondary | ICD-10-CM | POA: Diagnosis present

## 2023-09-21 DIAGNOSIS — E782 Mixed hyperlipidemia: Secondary | ICD-10-CM | POA: Diagnosis present

## 2023-09-21 DIAGNOSIS — Z79899 Other long term (current) drug therapy: Secondary | ICD-10-CM

## 2023-09-21 DIAGNOSIS — I129 Hypertensive chronic kidney disease with stage 1 through stage 4 chronic kidney disease, or unspecified chronic kidney disease: Secondary | ICD-10-CM | POA: Diagnosis present

## 2023-09-21 DIAGNOSIS — Z6841 Body Mass Index (BMI) 40.0 and over, adult: Secondary | ICD-10-CM

## 2023-09-21 DIAGNOSIS — I252 Old myocardial infarction: Secondary | ICD-10-CM

## 2023-09-21 DIAGNOSIS — Z882 Allergy status to sulfonamides status: Secondary | ICD-10-CM

## 2023-09-21 DIAGNOSIS — I5181 Takotsubo syndrome: Secondary | ICD-10-CM | POA: Diagnosis present

## 2023-09-21 LAB — URINALYSIS, ROUTINE W REFLEX MICROSCOPIC
Bilirubin Urine: NEGATIVE
Glucose, UA: NEGATIVE mg/dL
Hgb urine dipstick: NEGATIVE
Ketones, ur: NEGATIVE mg/dL
Leukocytes,Ua: NEGATIVE
Nitrite: NEGATIVE
Protein, ur: NEGATIVE mg/dL
Specific Gravity, Urine: 1.009 (ref 1.005–1.030)
pH: 5.5 (ref 5.0–8.0)

## 2023-09-21 LAB — CBC
HCT: 43 % (ref 36.0–46.0)
Hemoglobin: 14.6 g/dL (ref 12.0–15.0)
MCH: 29.2 pg (ref 26.0–34.0)
MCHC: 34 g/dL (ref 30.0–36.0)
MCV: 86 fL (ref 80.0–100.0)
Platelets: 264 10*3/uL (ref 150–400)
RBC: 5 MIL/uL (ref 3.87–5.11)
RDW: 13.1 % (ref 11.5–15.5)
WBC: 9.2 10*3/uL (ref 4.0–10.5)
nRBC: 0 % (ref 0.0–0.2)

## 2023-09-21 LAB — TROPONIN I (HIGH SENSITIVITY)
Troponin I (High Sensitivity): 2213 ng/L (ref <18)
Troponin I (High Sensitivity): 1432 ng/L (ref <18)

## 2023-09-21 LAB — COMPREHENSIVE METABOLIC PANEL
ALT: 25 U/L (ref 0–44)
AST: 20 U/L (ref 15–41)
Albumin: 4.2 g/dL (ref 3.5–5.0)
Alkaline Phosphatase: 85 U/L (ref 38–126)
Anion gap: 9 (ref 5–15)
BUN: 27 mg/dL — ABNORMAL HIGH (ref 6–20)
CO2: 26 mmol/L (ref 22–32)
Calcium: 10.6 mg/dL — ABNORMAL HIGH (ref 8.9–10.3)
Chloride: 106 mmol/L (ref 98–111)
Creatinine, Ser: 1.27 mg/dL — ABNORMAL HIGH (ref 0.44–1.00)
GFR, Estimated: 48 mL/min — ABNORMAL LOW (ref >60)
Glucose, Bld: 109 mg/dL — ABNORMAL HIGH (ref 70–99)
Potassium: 3.7 mmol/L (ref 3.5–5.1)
Sodium: 141 mmol/L (ref 135–145)
Total Bilirubin: 0.8 mg/dL (ref 0.0–1.2)
Total Protein: 7 g/dL (ref 6.5–8.1)

## 2023-09-21 MED ORDER — HEPARIN BOLUS VIA INFUSION
4000.0000 [IU] | Freq: Once | INTRAVENOUS | Status: AC
  Administered 2023-09-21: 4000 [IU] via INTRAVENOUS

## 2023-09-21 MED ORDER — ASPIRIN 325 MG PO TABS
325.0000 mg | ORAL_TABLET | Freq: Every day | ORAL | Status: DC
  Administered 2023-09-21: 325 mg via ORAL
  Filled 2023-09-21: qty 1

## 2023-09-21 MED ORDER — HEPARIN (PORCINE) 25000 UT/250ML-% IV SOLN
1000.0000 [IU]/h | INTRAVENOUS | Status: DC
  Administered 2023-09-21: 1200 [IU]/h via INTRAVENOUS
  Filled 2023-09-21: qty 250

## 2023-09-21 MED ORDER — ALUM & MAG HYDROXIDE-SIMETH 200-200-20 MG/5ML PO SUSP
30.0000 mL | Freq: Once | ORAL | Status: AC
  Administered 2023-09-21: 30 mL via ORAL
  Filled 2023-09-21: qty 30

## 2023-09-21 NOTE — Progress Notes (Signed)
 PHARMACY - ANTICOAGULATION CONSULT NOTE  Pharmacy Consult for Heparin  Indication:  ACS/STEMI  Allergies  Allergen Reactions   Shellfish Allergy Anaphylaxis   Sulfonamide Derivatives     REACTION: Unknown reaction    Patient Measurements: Weight: 129.7 kg (286 lb) Heparin  Dosing Weight: 86.8 kg  Vital Signs: Temp: 97.7 F (36.5 C) (01/06 1526) BP: 117/80 (01/06 1830) Pulse Rate: 66 (01/06 1830)  Labs: Recent Labs    09/21/23 1529  HGB 14.6  HCT 43.0  PLT 264  CREATININE 1.27*  TROPONINIHS 2,213*    CrCl cannot be calculated (Unknown ideal weight.).   Medical History: Past Medical History:  Diagnosis Date   Hypertension    Thyroid disease     Medications:  Scheduled:   aspirin   325 mg Oral Daily   Infusions:   Assessment: 62YOF who presents with SOB, found to be positive for influenza A. Not on [facility] prior to admission. Pharmacy has been consulted to dose heparin .  Hgb 14.6, plt 264. Trops 2213.  Goal of Therapy:  Heparin  level 0.3-0.7 units/ml Monitor platelets by anticoagulation protocol: Yes   Plan:  Give 4000 units bolus x 1 Start heparin  infusion at 1200 units/hr Check anti-Xa level in 6 hours and daily while on heparin  Continue to monitor H&H and platelets  Harlene DEL Dani Danis 09/21/2023,7:35 PM

## 2023-09-21 NOTE — ED Triage Notes (Signed)
 Pt endorses recent dx with Flu A last Tuesday, Reports shob today with exertion.

## 2023-09-21 NOTE — ED Provider Notes (Addendum)
 [place] EMERGENCY DEPARTMENT AT [facility] Provider Note   CSN: [id number] Arrival date & time: 09/21/23  1500     History  Chief Complaint  Patient presents with   Shortness of Breath    Bonnie Martin is a 63 y.o. female who presents with shortness of breath that started this morning.  Reports that last week she was having flulike symptoms.  Started on Z-Pak and steroids by PCP.  Respiratory panel came back positive for influenza A.  She was started on Tamiflu.  Reports that this morning she went to go use the restroom started feeling short of breath with a pressure in her chest.  She took an aspirin .  Her symptoms soon resolved thereafter, however throughout the day she has had exertional shortness of breath.  Reports that she has a history of heart attack many years ago.  No history of blood clot.  Shortness of Breath      Home Medications Prior to Admission medications   Medication Sig Start Date End Date Taking? Authorizing Provider  allopurinol  (ZYLOPRIM ) 300 MG tablet Take 300 mg by mouth daily. 03/20/22   [provider]  amLODipine  (NORVASC ) 10 MG tablet Take 1 tablet by mouth daily. 10/21/16 10/21/17  [provider]  colchicine 0.6 MG tablet Take by mouth as directed. 12/30/21   [provider]  furosemide  (LASIX ) 40 MG tablet Take 1 tablet (40 mg total) by mouth 2 (two) times daily. 07/19/22   Raford Lenis, MD  halobetasol (ULTRAVATE) 0.05 % cream Apply topically 2 (two) times daily as needed. 12/30/21   [provider]  ibuprofen  (ADVIL ) 800 MG tablet Take 1 tablet (800 mg total) by mouth 3 (three) times daily. Take with food 06/10/21   Triplett, Tammy, PA-C  isosorbide mononitrate (IMDUR) 30 MG 24 hr tablet Take 30 mg by mouth daily. 04/23/22   [provider]  levothyroxine  (SYNTHROID ) 100 MCG tablet Take 100 mcg by mouth daily. 03/20/22   [provider]  losartan  (COZAAR ) 100 MG tablet Take 1 tablet by mouth  daily. 06/19/16 06/19/17  [provider]  methocarbamol  (ROBAXIN ) 500 MG tablet Take 1 tablet (500 mg total) by mouth 3 (three) times daily. Medication may cause drowsiness 06/10/21   Triplett, Tammy, PA-C  metoprolol  (TOPROL -XL) 200 MG 24 hr tablet Take 200 mg by mouth daily. 03/21/22   [provider]  omeprazole  (PRILOSEC) 20 MG capsule Take 2 capsules (40 mg total) by mouth daily. 04/28/22 05/28/22  Hildegard Loge, PA-C  oxyCODONE -acetaminophen  (PERCOCET/ROXICET) 5-325 MG tablet Take 1-2 tablets by mouth every 8 (eight) hours as needed for severe pain. 02/12/22   Patsey Lot, MD  ranolazine  (RANEXA ) 500 MG 12 hr tablet Take 500 mg by mouth 2 (two) times daily. 04/23/22   [provider]  valsartan (DIOVAN) 320 MG tablet Take 320 mg by mouth daily. 03/05/22   [provider]      Allergies    Shellfish allergy and Sulfonamide derivatives    Review of Systems   Review of Systems  Respiratory:  Positive for shortness of breath.     Physical Exam Updated Vital Signs BP 134/78   Pulse 72   Temp (!) 97.5 F (36.4 C) (Oral)   Resp 18   Ht 5' 4 (1.626 m)   Wt 129.7 kg   SpO2 98%   BMI 49.08 kg/m  Physical Exam Vitals and nursing note reviewed.  Constitutional:      General: She is not in  acute distress.    Appearance: She is well-developed.  HENT:     Head: Normocephalic and atraumatic.  Eyes:     Conjunctiva/sclera: Conjunctivae normal.  Cardiovascular:     Rate and Rhythm: Normal rate and regular rhythm.     Heart sounds: No murmur heard. Pulmonary:     Effort: Pulmonary effort is normal. No respiratory distress.     Breath sounds: Normal breath sounds.  Abdominal:     Palpations: Abdomen is soft.     Tenderness: There is no abdominal tenderness.  Musculoskeletal:        General: No swelling.     Cervical back: Neck supple.     Right lower leg: No tenderness. No edema.     Left lower leg: No tenderness. No edema.  Skin:    General: Skin  is warm and dry.     Capillary Refill: Capillary refill takes less than 2 seconds.  Neurological:     Mental Status: She is alert.  Psychiatric:        Mood and Affect: Mood normal.     ED Results / Procedures / Treatments   Labs (all labs ordered are listed, but only abnormal results are displayed) Labs Reviewed  COMPREHENSIVE METABOLIC PANEL - Abnormal; Notable for the following components:      Result Value   Glucose, Bld 109 (*)    BUN 27 (*)    Creatinine, Ser 1.27 (*)    Calcium  10.6 (*)    GFR, Estimated 48 (*)    All other components within normal limits  URINALYSIS, ROUTINE W REFLEX MICROSCOPIC - Abnormal; Notable for the following components:   Color, Urine COLORLESS (*)    All other components within normal limits  TROPONIN I (HIGH SENSITIVITY) - Abnormal; Notable for the following components:   Troponin I (High Sensitivity) 2,213 (*)    All other components within normal limits  TROPONIN I (HIGH SENSITIVITY) - Abnormal; Notable for the following components:   Troponin I (High Sensitivity) 1,432 (*)    All other components within normal limits  CBC  HEPARIN  LEVEL (UNFRACTIONATED)  CBC  HEPARIN  LEVEL (UNFRACTIONATED)    EKG EKG Interpretation Date/Time:  Monday September 21 2023 15:26:34 EST Ventricular Rate:  79 PR Interval:  142 QRS Duration:  90 QT Interval:  396 QTC Calculation: 454 R Axis:   33  Text Interpretation: Normal sinus rhythm Normal ECG When compared with ECG of 28-Apr-2022 09:29, PREVIOUS ECG IS PRESENT Confirmed by Jerrol Agent ([id number]) on 09/21/2023 7:20:39 PM  Radiology DG Chest 2 View Result Date: 09/21/2023 CLINICAL DATA:  Shortness of breath.  Influenza. EXAM: CHEST - 2 VIEW COMPARISON:  07/19/2022 FINDINGS: The heart size and mediastinal contours are within normal limits. Both lungs are clear. The visualized skeletal structures are unremarkable. IMPRESSION: No active cardiopulmonary disease. Electronically Signed   By: Norleen DELENA Kil M.D.    On: 09/21/2023 16:08    Procedures .Critical Care  Performed by: Donnajean Agent DEL, PA-C Authorized by: Donnajean Agent DEL, PA-C   Critical care provider statement:    Critical care time (minutes):  60   Critical care was necessary to treat or prevent imminent or life-threatening deterioration of the following conditions: NSTEMI.   Critical care was time spent personally by me on the following activities:  Development of treatment plan with patient or surrogate, discussions with consultants, review of old charts and ordering and review of laboratory studies   I assumed direction of critical care for this  patient from another provider in my specialty: yes     Care discussed with: admitting provider       Medications Ordered in ED Medications  aspirin  tablet 325 mg (325 mg Oral Given 09/21/23 1939)  heparin  ADULT infusion 100 units/mL (25000 units/250mL) (1,200 Units/hr Intravenous New Bag/Given 09/21/23 2004)  heparin  bolus via infusion 4,000 Units (4,000 Units Intravenous Bolus from Bag 09/21/23 2004)  alum & mag hydroxide-simeth (MAALOX/MYLANTA) 200-200-20 MG/5ML suspension 30 mL (30 mLs Oral Given 09/21/23 2026)    ED Course/ Medical Decision Making/ A&P Clinical Course as of 09/21/23 2147  Mon Sep 21, 2023  1856 Troponin I (High Sensitivity) [JT]    Clinical Course User Index [JT] Donnajean Lynwood DEL, PA-C                                 Medical Decision Making Amount and/or Complexity of Data Reviewed Labs: ordered. Decision-making details documented in ED Course. Radiology: ordered.  Risk OTC drugs. Prescription drug management. Decision regarding hospitalization.   This patient presents to the ED with chief complaint(s) of shortness of breath.  The complaint involves an extensive differential diagnosis and also carries with it a high risk of complications and morbidity.   pertinent past medical history as listed in HPI  The differential diagnosis includes  ACS, PE,  viral URI, pneumonia The initial plan is to  Basic labs, chest x-ray Additional history obtained: Additional history obtained from family Records reviewed previous admission documents  Initial Assessment:   Patient is hemodynamically stable, nontoxic-appearing presenting with shortness of breath since this morning.  She does describe some chest pressure as well that she has felt throughout the day.  Has a history of MI many years ago.  Independent ECG interpretation:  Sinus rhythm without ischemic changes  Independent labs interpretation:  The following labs were independently interpreted:  CBC unremarkable, CMP and UA without significant abnormality  Independent visualization and interpretation of imaging: I independently visualized the following imaging with scope of interpretation limited to determining acute life threatening conditions related to emergency care: Chest x-ray, which revealed no cardiopulmonary disease  Treatment and Reassessment: Troponin came back severely elevated in the 2000's.  Patient started on heparin  and aspirin   Consultations obtained:   Hospitalist consulted 7:25 PM Received return call at 7:45 PM from Rupert, rec consult cards Cardiology consulted 7:45 PM  Received return call approximately 7:55 PM from Dr. Lonni Nanas, agreed to admit to cardiology service.  Disposition:   The patient has been appropriately medically screened and/or stabilized in the ED. I have low suspicion for any other emergent medical condition which would require further screening, evaluation or treatment in the ED or require inpatient management. At time of discharge the patient is hemodynamically stable and in no acute distress. I have discussed work-up results and diagnosis with patient and answered all questions. Patient is agreeable with discharge plan. We discussed strict return precautions for returning to the emergency department and they verbalized understanding.      Social Determinants of Health:   none  This note was dictated with voice recognition software.  Despite best efforts at proofreading, errors may have occurred which can change the documentation meaning.          Final Clinical Impression(s) / ED Diagnoses Final diagnoses:  NSTEMI (non-ST elevated myocardial infarction) (HCC)    Rx / DC Orders ED Discharge Orders     None  Donnajean Lynwood DEL, PA-C 09/21/23 2127    Donnajean Lynwood DEL, PA-C 09/21/23 2147    Jerrol Lynwood, MD 09/21/23 2330

## 2023-09-21 NOTE — ED Notes (Signed)
 Pt taken to bathroom via wheelchair, per pt request/ pts daughter assisted pt

## 2023-09-22 ENCOUNTER — Other Ambulatory Visit: Payer: Self-pay

## 2023-09-22 ENCOUNTER — Inpatient Hospital Stay (HOSPITAL_COMMUNITY): Payer: 59

## 2023-09-22 ENCOUNTER — Encounter (HOSPITAL_COMMUNITY)
Admission: EM | Disposition: A | Discharge status: Home / Self Care | Payer: Self-pay | Source: Home / Self Care | Attending: Cardiology

## 2023-09-22 DIAGNOSIS — N1831 Chronic kidney disease, stage 3a: Secondary | ICD-10-CM | POA: Diagnosis present

## 2023-09-22 DIAGNOSIS — M109 Gout, unspecified: Secondary | ICD-10-CM | POA: Diagnosis present

## 2023-09-22 DIAGNOSIS — Z79899 Other long term (current) drug therapy: Secondary | ICD-10-CM | POA: Diagnosis not present

## 2023-09-22 DIAGNOSIS — Z8249 Family history of ischemic heart disease and other diseases of the circulatory system: Secondary | ICD-10-CM | POA: Diagnosis not present

## 2023-09-22 DIAGNOSIS — Z6841 Body Mass Index (BMI) 40.0 and over, adult: Secondary | ICD-10-CM | POA: Diagnosis not present

## 2023-09-22 DIAGNOSIS — R7989 Other specified abnormal findings of blood chemistry: Secondary | ICD-10-CM | POA: Diagnosis not present

## 2023-09-22 DIAGNOSIS — I214 Non-ST elevation (NSTEMI) myocardial infarction: Secondary | ICD-10-CM | POA: Diagnosis present

## 2023-09-22 DIAGNOSIS — I5181 Takotsubo syndrome: Secondary | ICD-10-CM | POA: Diagnosis present

## 2023-09-22 DIAGNOSIS — Z79890 Hormone replacement therapy (postmenopausal): Secondary | ICD-10-CM | POA: Diagnosis not present

## 2023-09-22 DIAGNOSIS — I129 Hypertensive chronic kidney disease with stage 1 through stage 4 chronic kidney disease, or unspecified chronic kidney disease: Secondary | ICD-10-CM | POA: Diagnosis present

## 2023-09-22 DIAGNOSIS — E039 Hypothyroidism, unspecified: Secondary | ICD-10-CM | POA: Diagnosis present

## 2023-09-22 DIAGNOSIS — Z882 Allergy status to sulfonamides status: Secondary | ICD-10-CM | POA: Diagnosis not present

## 2023-09-22 DIAGNOSIS — E782 Mixed hyperlipidemia: Secondary | ICD-10-CM | POA: Diagnosis present

## 2023-09-22 DIAGNOSIS — Z7982 Long term (current) use of aspirin: Secondary | ICD-10-CM | POA: Diagnosis not present

## 2023-09-22 DIAGNOSIS — Z91013 Allergy to seafood: Secondary | ICD-10-CM | POA: Diagnosis not present

## 2023-09-22 DIAGNOSIS — Z90710 Acquired absence of both cervix and uterus: Secondary | ICD-10-CM | POA: Diagnosis not present

## 2023-09-22 DIAGNOSIS — I252 Old myocardial infarction: Secondary | ICD-10-CM | POA: Diagnosis not present

## 2023-09-22 DIAGNOSIS — Z841 Family history of disorders of kidney and ureter: Secondary | ICD-10-CM | POA: Diagnosis not present

## 2023-09-22 HISTORY — PX: LEFT HEART CATH AND CORONARY ANGIOGRAPHY: CATH118249

## 2023-09-22 LAB — CBC
HCT: 39.6 % (ref 36.0–46.0)
Hemoglobin: 13.5 g/dL (ref 12.0–15.0)
MCH: 29.7 pg (ref 26.0–34.0)
MCHC: 34.1 g/dL (ref 30.0–36.0)
MCV: 87 fL (ref 80.0–100.0)
Platelets: 240 10*3/uL (ref 150–400)
RBC: 4.55 MIL/uL (ref 3.87–5.11)
RDW: 13 % (ref 11.5–15.5)
WBC: 7.9 10*3/uL (ref 4.0–10.5)
nRBC: 0 % (ref 0.0–0.2)

## 2023-09-22 LAB — ECHOCARDIOGRAM COMPLETE
Area-P 1/2: 2.76 cm2
Calc EF: 62.4 %
Height: 64 in
S' Lateral: 2.2 cm
Single Plane A2C EF: 72.9 %
Single Plane A4C EF: 53.3 %
Weight: 4465.64 [oz_av]

## 2023-09-22 LAB — BASIC METABOLIC PANEL
Anion gap: 11 (ref 5–15)
BUN: 20 mg/dL (ref 6–20)
CO2: 22 mmol/L (ref 22–32)
Calcium: 10.5 mg/dL — ABNORMAL HIGH (ref 8.9–10.3)
Chloride: 108 mmol/L (ref 98–111)
Creatinine, Ser: 1.2 mg/dL — ABNORMAL HIGH (ref 0.44–1.00)
GFR, Estimated: 52 mL/min — ABNORMAL LOW (ref >60)
Glucose, Bld: 127 mg/dL — ABNORMAL HIGH (ref 70–99)
Potassium: 3.8 mmol/L (ref 3.5–5.1)
Sodium: 141 mmol/L (ref 135–145)

## 2023-09-22 LAB — T4, FREE: Free T4: 1.85 ng/dL — ABNORMAL HIGH (ref 0.61–1.12)

## 2023-09-22 LAB — APTT: aPTT: 72 s — ABNORMAL HIGH (ref 24–36)

## 2023-09-22 LAB — TYPE AND SCREEN
ABO/RH(D): O POS
Antibody Screen: NEGATIVE

## 2023-09-22 LAB — LIPID PANEL
Cholesterol: 204 mg/dL — ABNORMAL HIGH (ref 0–200)
HDL: 65 mg/dL (ref >40)
LDL Cholesterol: 117 mg/dL — ABNORMAL HIGH (ref 0–99)
Total CHOL/HDL Ratio: 3.1 {ratio}
Triglycerides: 112 mg/dL (ref <150)
VLDL: 22 mg/dL (ref 0–40)

## 2023-09-22 LAB — HEPARIN LEVEL (UNFRACTIONATED)
Heparin Unfractionated: 0.87 [IU]/mL — ABNORMAL HIGH (ref 0.30–0.70)
Heparin Unfractionated: 0.45 [IU]/mL (ref 0.30–0.70)

## 2023-09-22 LAB — MAGNESIUM: Magnesium: 2.5 mg/dL — ABNORMAL HIGH (ref 1.7–2.4)

## 2023-09-22 LAB — SEDIMENTATION RATE: Sed Rate: 3 mm/h (ref 0–22)

## 2023-09-22 LAB — PROTIME-INR
INR: 1.2 (ref 0.8–1.2)
Prothrombin Time: 15 s (ref 11.4–15.2)

## 2023-09-22 LAB — ABO/RH: ABO/RH(D): O POS

## 2023-09-22 LAB — C-REACTIVE PROTEIN: CRP: 3.5 mg/dL — ABNORMAL HIGH (ref <1.0)

## 2023-09-22 LAB — TSH: TSH: 6.886 u[IU]/mL — ABNORMAL HIGH (ref 0.350–4.500)

## 2023-09-22 LAB — BRAIN NATRIURETIC PEPTIDE: B Natriuretic Peptide: 96.6 pg/mL (ref 0.0–100.0)

## 2023-09-22 SURGERY — LEFT HEART CATH AND CORONARY ANGIOGRAPHY
Anesthesia: LOCAL

## 2023-09-22 MED ORDER — ASPIRIN 81 MG PO TBEC: 81.0000 mg | DELAYED_RELEASE_TABLET | Freq: Every day | ORAL | Status: DC

## 2023-09-22 MED ORDER — PERFLUTREN LIPID MICROSPHERE
1.0000 mL | INTRAVENOUS | Status: AC | PRN
  Administered 2023-09-22: 1 mL via INTRAVENOUS

## 2023-09-22 MED ORDER — SODIUM CHLORIDE 0.9 % IV SOLN: INTRAVENOUS | Status: AC

## 2023-09-22 MED ORDER — FENTANYL CITRATE (PF) 100 MCG/2ML IJ SOLN
INTRAMUSCULAR | Status: AC
  Filled 2023-09-22: qty 2

## 2023-09-22 MED ORDER — ASPIRIN 81 MG PO TBEC
81.0000 mg | DELAYED_RELEASE_TABLET | Freq: Every day | ORAL | Status: DC
  Administered 2023-09-22 – 2023-09-23 (×2): 81 mg via ORAL
  Filled 2023-09-22 (×2): qty 1

## 2023-09-22 MED ORDER — LABETALOL HCL 5 MG/ML IV SOLN: 10.0000 mg | INTRAVENOUS | Status: AC | PRN

## 2023-09-22 MED ORDER — ALLOPURINOL 300 MG PO TABS
300.0000 mg | ORAL_TABLET | Freq: Every day | ORAL | Status: DC
  Administered 2023-09-22 – 2023-09-23 (×2): 300 mg via ORAL
  Filled 2023-09-22 (×2): qty 1

## 2023-09-22 MED ORDER — ACETAMINOPHEN 325 MG PO TABS: 650.0000 mg | ORAL_TABLET | ORAL | Status: DC | PRN

## 2023-09-22 MED ORDER — HEPARIN SODIUM (PORCINE) 1000 UNIT/ML IJ SOLN
INTRAMUSCULAR | Status: DC | PRN
  Administered 2023-09-22: 6000 [IU] via INTRAVENOUS

## 2023-09-22 MED ORDER — ASPIRIN 325 MG PO TBEC: 325.0000 mg | DELAYED_RELEASE_TABLET | Freq: Once | ORAL | Status: DC

## 2023-09-22 MED ORDER — METOPROLOL TARTRATE 25 MG PO TABS: 25.0000 mg | ORAL_TABLET | Freq: Four times a day (QID) | ORAL | Status: DC

## 2023-09-22 MED ORDER — PANTOPRAZOLE SODIUM 40 MG PO TBEC
40.0000 mg | DELAYED_RELEASE_TABLET | Freq: Every day | ORAL | Status: DC
  Administered 2023-09-22 – 2023-09-23 (×2): 40 mg via ORAL
  Filled 2023-09-22 (×2): qty 1

## 2023-09-22 MED ORDER — OXYCODONE-ACETAMINOPHEN 5-325 MG PO TABS: 1.0000 | ORAL_TABLET | Freq: Three times a day (TID) | ORAL | Status: DC | PRN

## 2023-09-22 MED ORDER — LIDOCAINE HCL (PF) 1 % IJ SOLN
INTRAMUSCULAR | Status: AC
Start: 2023-09-22 — End: ?
  Filled 2023-09-22: qty 30

## 2023-09-22 MED ORDER — VERAPAMIL HCL 2.5 MG/ML IV SOLN
INTRAVENOUS | Status: AC
Start: 2023-09-22 — End: ?
  Filled 2023-09-22: qty 2

## 2023-09-22 MED ORDER — HEPARIN SODIUM (PORCINE) 1000 UNIT/ML IJ SOLN
INTRAMUSCULAR | Status: AC
Start: 2023-09-22 — End: ?
  Filled 2023-09-22: qty 10

## 2023-09-22 MED ORDER — SODIUM CHLORIDE 0.9% FLUSH
3.0000 mL | Freq: Two times a day (BID) | INTRAVENOUS | Status: DC
  Administered 2023-09-22 – 2023-09-23 (×2): 3 mL via INTRAVENOUS

## 2023-09-22 MED ORDER — SODIUM CHLORIDE 0.9 % WEIGHT BASED INFUSION
1.0000 mL/kg/h | INTRAVENOUS | Status: DC
  Administered 2023-09-22: 1 mL/kg/h via INTRAVENOUS

## 2023-09-22 MED ORDER — AMLODIPINE BESYLATE 10 MG PO TABS
10.0000 mg | ORAL_TABLET | Freq: Every day | ORAL | Status: DC
  Administered 2023-09-22 – 2023-09-23 (×2): 10 mg via ORAL
  Filled 2023-09-22 (×2): qty 1

## 2023-09-22 MED ORDER — ONDANSETRON HCL 4 MG/2ML IJ SOLN: 4.0000 mg | Freq: Four times a day (QID) | INTRAMUSCULAR | Status: DC | PRN

## 2023-09-22 MED ORDER — LEVOTHYROXINE SODIUM 100 MCG PO TABS
100.0000 ug | ORAL_TABLET | Freq: Every day | ORAL | Status: DC
  Administered 2023-09-22 – 2023-09-23 (×2): 100 ug via ORAL
  Filled 2023-09-22 (×2): qty 1

## 2023-09-22 MED ORDER — SODIUM CHLORIDE 0.9 % WEIGHT BASED INFUSION: 3.0000 mL/kg/h | INTRAVENOUS | Status: DC

## 2023-09-22 MED ORDER — SODIUM CHLORIDE 0.9 % IV SOLN: 250.0000 mL | INTRAVENOUS | Status: DC | PRN

## 2023-09-22 MED ORDER — SODIUM CHLORIDE 0.9% FLUSH: 3.0000 mL | INTRAVENOUS | Status: DC | PRN

## 2023-09-22 MED ORDER — METOPROLOL TARTRATE 50 MG PO TABS
50.0000 mg | ORAL_TABLET | Freq: Four times a day (QID) | ORAL | Status: DC
  Administered 2023-09-22 – 2023-09-23 (×4): 50 mg via ORAL
  Filled 2023-09-22 (×4): qty 1

## 2023-09-22 MED ORDER — VERAPAMIL HCL 2.5 MG/ML IV SOLN
INTRAVENOUS | Status: DC | PRN
  Administered 2023-09-22: 10 mL via INTRA_ARTERIAL

## 2023-09-22 MED ORDER — MIDAZOLAM HCL 2 MG/2ML IJ SOLN
INTRAMUSCULAR | Status: DC | PRN
  Administered 2023-09-22: 2 mg via INTRAVENOUS

## 2023-09-22 MED ORDER — ORAL CARE MOUTH RINSE
15.0000 mL | OROMUCOSAL | Status: DC | PRN
Start: 2023-09-22 — End: 2023-09-23

## 2023-09-22 MED ORDER — ATORVASTATIN CALCIUM 80 MG PO TABS
80.0000 mg | ORAL_TABLET | Freq: Every day | ORAL | Status: DC
  Administered 2023-09-22 – 2023-09-23 (×2): 80 mg via ORAL
  Filled 2023-09-22 (×2): qty 1

## 2023-09-22 MED ORDER — IOHEXOL 350 MG/ML SOLN
INTRAVENOUS | Status: DC | PRN
  Administered 2023-09-22: 35 mL

## 2023-09-22 MED ORDER — LIDOCAINE HCL (PF) 1 % IJ SOLN
INTRAMUSCULAR | Status: DC | PRN
  Administered 2023-09-22: 2 mL via INTRADERMAL

## 2023-09-22 MED ORDER — RANOLAZINE ER 500 MG PO TB12: 500.0000 mg | ORAL_TABLET | Freq: Two times a day (BID) | ORAL | Status: DC

## 2023-09-22 MED ORDER — MIDAZOLAM HCL 2 MG/2ML IJ SOLN
INTRAMUSCULAR | Status: AC
Start: 2023-09-22 — End: ?
  Filled 2023-09-22: qty 2

## 2023-09-22 MED ORDER — HYDRALAZINE HCL 20 MG/ML IJ SOLN
10.0000 mg | INTRAMUSCULAR | Status: AC | PRN
Start: 2023-09-22 — End: 2023-09-22

## 2023-09-22 MED ORDER — HEPARIN (PORCINE) IN NACL 1000-0.9 UT/500ML-% IV SOLN
INTRAVENOUS | Status: DC | PRN
  Administered 2023-09-22 (×2): 500 mL

## 2023-09-22 MED ORDER — LOSARTAN POTASSIUM 50 MG PO TABS: 100.0000 mg | ORAL_TABLET | Freq: Every day | ORAL | Status: DC

## 2023-09-22 MED ORDER — NITROGLYCERIN 0.4 MG SL SUBL: 0.4000 mg | SUBLINGUAL_TABLET | SUBLINGUAL | Status: DC | PRN

## 2023-09-22 MED ORDER — FENTANYL CITRATE (PF) 100 MCG/2ML IJ SOLN
INTRAMUSCULAR | Status: DC | PRN
  Administered 2023-09-22: 50 ug via INTRAVENOUS

## 2023-09-22 SURGICAL SUPPLY — 7 items
CATH 5FR JL3.5 JR4 ANG PIG MP (CATHETERS) IMPLANT
DEVICE RAD COMP TR BAND LRG (VASCULAR PRODUCTS) IMPLANT
GLIDESHEATH SLEND SS 6F .021 (SHEATH) IMPLANT
GUIDEWIRE INQWIRE 1.5J.035X260 (WIRE) IMPLANT
INQWIRE 1.5J .035X260CM (WIRE) ×1
PACK CARDIAC CATHETERIZATION (CUSTOM PROCEDURE TRAY) ×2 IMPLANT
SET ATX-X65L (MISCELLANEOUS) IMPLANT

## 2023-09-22 NOTE — Progress Notes (Signed)
 PHARMACY - ANTICOAGULATION CONSULT NOTE  Pharmacy Consult for Heparin  Indication:  ACS/STEMI  Allergies  Allergen Reactions   Shellfish Allergy Anaphylaxis   Sulfonamide Derivatives     REACTION: Unknown reaction    Patient Measurements: Height: 5' 4 (162.6 cm) Weight: 129.7 kg (285 lb 15 oz) IBW/kg (Calculated) : 54.7 Heparin  Dosing Weight: 86.8 kg  Vital Signs: Temp: 98.4 F (36.9 C) (01/07 0219) Temp Source: Oral (01/07 0219) BP: 135/110 (01/07 0219) Pulse Rate: 79 (01/07 0219)  Labs: Recent Labs    09/21/23 1529 09/21/23 2008 09/22/23 0245  HGB 14.6  --  13.5  HCT 43.0  --  39.6  PLT 264  --  240  HEPARINUNFRC  --   --  0.87*  CREATININE 1.27*  --   --   TROPONINIHS 2,213* 1,432*  --     Estimated Creatinine Clearance: 63 mL/min (A) (by C-G formula based on SCr of 1.27 mg/dL (H)).  Assessment: 62YOF who presents with SOB, found to be positive for influenza A. Not on [facility] prior to admission. Pharmacy has been consulted to dose heparin .  Hgb 14.6, plt 264. Trops 2213  1/7 AM: heparin  level returned at 0.87 on 1200 units/hr. Per RN, no issues with the heparin  infusion running continuously or signs/symptoms of bleeding. CBC shows Hgb 14.6 > 13.5, plts 264 > 240  Goal of Therapy:  Heparin  level 0.3-0.7 units/ml Monitor platelets by anticoagulation protocol: Yes   Plan:  Decrease heparin  infusion to 1000 units/hr Check anti-Xa level in 6 hours and daily while on heparin  Continue to monitor H&H and platelets  Lynwood Poplar, PharmD. Clinical Pharmacist 09/22/2023 3:38 AM

## 2023-09-22 NOTE — Evaluation (Signed)
 Occupational Therapy Evaluation and Discharge Summary Patient Details Name: Bonnie Martin MRN: [id number] DOB: 1961-04-22 Today's Date: 09/22/2023   History of Present Illness Bonnie Martin is a 63 y.o. female who presents on 09/21/23 with chest pressure. Pt with NSTEMI. Also + flu A.  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  Pt is independent in room with all adls and pushes IV pole to bathroom independently.  Do not feel pt needs acute OT services at this time. Talked with pt at length about energy conservation techniques in the setting of NSTEMI and pt demonstrated understanding.  If pt condition should change after cath, please reconsult OT services.         If plan is discharge home, recommend the following: Assist for transportation    Functional Status Assessment  Patient has not had a recent decline in their functional status  Equipment Recommendations  None recommended by OT    Recommendations for Other Services       Precautions / Restrictions Precautions Precautions: None Restrictions Weight Bearing Restrictions Per Provider Order: No      Mobility Bed Mobility Overal bed mobility: Modified Independent             General bed mobility comments: no difficulty with bed mobility    Transfers Overall transfer level: Modified independent Equipment used: None               General transfer comment: no assist needed for sit>stand      Balance Overall balance assessment: Mild deficits observed, not formally tested                                         ADL either performed or assessed with clinical judgement   ADL Overall ADL's : At baseline                                       General ADL Comments: Pt  at baseline with all basic adls. Pt is mildly SOB during adl activities. Reviewed energy conservation techniques with pt and pt seemed to understand.  Pt going for cardiac cath this afternoon but at this point outside of fatigue and some SOB is at baseline.     Vision Baseline Vision/History: 1 Wears glasses Ability to See in Adequate Light: 0 Adequate Patient Visual Report: No change from baseline Vision Assessment?: No apparent visual deficits Additional Comments: wears glasses at all times     Perception Perception: Within Functional Limits       Praxis Praxis: [facility]       Pertinent Vitals/Pain Pain Assessment Pain Assessment: No/denies pain     Extremity/Trunk Assessment Upper Extremity Assessment Upper Extremity Assessment: Overall WFL for tasks assessed   Lower Extremity Assessment Lower Extremity Assessment: Defer to PT evaluation RLE Deficits / Details: R knee OA, no buckling with ambulation but does have antalgic gait. Has had injections. Reports that she needs a TKA RLE Sensation: WNL RLE Coordination: decreased gross motor   Cervical / Trunk Assessment Cervical / Trunk Assessment: Other exceptions Cervical / Trunk Exceptions: increased body habitus   Communication Communication  Communication: No apparent difficulties Cueing Techniques: Verbal cues   Cognition Arousal: Alert Behavior During Therapy: WFL for tasks assessed/performed Overall Cognitive Status: Within Functional Limits for tasks assessed                                       General Comments  discussed cardiovascular exercise post MI and possibility of cardiac rehab. Also discussed balance exercises for R knee to increase stability    Exercises     Shoulder Instructions      Home Living Family/patient expects to be discharged to:: Private residence Living Arrangements: Spouse/significant other Available Help at Discharge: Family;Available PRN/intermittently Type of Home:  House       Home Layout: Two level;Able to live on main level with bedroom/bathroom Alternate Level Stairs-Number of Steps: flight   Bathroom Shower/Tub: Producer, Television/film/video: Standard     Home Equipment: None   Additional Comments: pt lives with spouse, 2 kids, and a grandchild      Prior Functioning/Environment Prior Level of Function : Independent/Modified Independent;Working/employed;Driving             Mobility Comments: works as a runner, broadcasting/film/video at the [employer] ADLs Comments: independent        OT Problem List: Pain      OT Treatment/Interventions:      OT Goals(Current goals can be found in the care plan section) Acute Rehab OT Goals Patient Stated Goal: to get the cath and go home OT Goal Formulation: All assessment and education complete, DC therapy  OT Frequency:      Co-evaluation              AM-PAC OT 6 Clicks Daily Activity     Outcome Measure Help from another person eating meals?: None Help from another person taking care of personal grooming?: None Help from another person toileting, which includes using toliet, bedpan, or urinal?: None Help from another person bathing (including washing, rinsing, drying)?: None Help from another person to put on and taking off regular upper body clothing?: None Help from another person to put on and taking off regular lower body clothing?: None 6 Click Score: 24   End of Session Nurse Communication: Mobility status  Activity Tolerance: Patient limited by fatigue Patient left: in bed;with call bell/phone within reach  OT Visit Diagnosis: Unsteadiness on feet (R26.81)                Time: 8496-8484 OT Time Calculation (min): 12 min Charges:  OT General Charges $OT Visit: 1 Visit OT Evaluation $OT Eval Low Complexity: 1 Low  Joshua Silvano Dragon 09/22/2023, 3:19 PM

## 2023-09-22 NOTE — Progress Notes (Signed)
 PHARMACY - ANTICOAGULATION CONSULT NOTE  Pharmacy Consult for Heparin  Indication:  ACS/STEMI  Allergies  Allergen Reactions   Shellfish Allergy Anaphylaxis   Sulfonamide Derivatives     REACTION: Unknown reaction    Patient Measurements: Height: 5' 4 (162.6 cm) Weight: 126.6 kg (279 lb 1.6 oz) IBW/kg (Calculated) : 54.7 Heparin  Dosing Weight: 86.8 kg  Vital Signs: Temp: 98.1 F (36.7 C) (01/07 0735) Temp Source: Oral (01/07 0735) BP: 118/61 (01/07 0735) Pulse Rate: 71 (01/07 0735)  Labs: Recent Labs    09/21/23 1529 09/21/23 2008 09/22/23 0245 09/22/23 0308 09/22/23 0756 09/22/23 1017  HGB 14.6  --  13.5  --   --   --   HCT 43.0  --  39.6  --   --   --   PLT 264  --  240  --   --   --   APTT  --   --   --   --  72*  --   LABPROT  --   --   --   --  15.0  --   INR  --   --   --   --  1.2  --   HEPARINUNFRC  --   --  0.87*  --   --  0.45  CREATININE 1.27*  --   --  1.20*  --   --   TROPONINIHS 2,213* 1,432*  --   --   --   --     Estimated Creatinine Clearance: 65.7 mL/min (A) (by C-G formula based on SCr of 1.2 mg/dL (H)).  Assessment: 62YOF who presents with SOB, found to be positive for influenza A. Not on [facility] prior to admission. Pharmacy has been consulted to dose heparin .  -heparin  level= 0.45 on 1000 units/hr -plans noted for cardiac cath today  Goal of Therapy:  Heparin  level 0.3-0.7 units/ml Monitor platelets by anticoagulation protocol: Yes   Plan:  -Continue heparin  at 1000 units/hr -Will follow plans post cath  Prentice Poisson, PharmD Clinical Pharmacist **Pharmacist phone directory can now be found on [email or web address] ([email or web address]).  Listed under [facility].

## 2023-09-22 NOTE — TOC Initial Note (Signed)
 Transition of Care [facility]) - Initial/Assessment Note    Patient Details  Name: Bonnie Martin MRN: [id number] Date of Birth: 1961/09/14  [facility] CM/SW Contact:    Waddell Barnie Rama, RN Phone Number: 09/22/2023, 12:15 PM  Clinical Narrative:                 From home with spouse, has PCP and insurance on file, states has no HH services in place at this time or DME at home.  States daughter will transport her home at [employer] and family is support system, states [facility] [place] on [address].  Pta self ambulatory .  Expected Discharge Plan: Home/Self Care Barriers to Discharge: Continued Medical Work up   Patient Goals and CMS Choice Patient states their goals for this hospitalization and ongoing recovery are:: return home   Choice offered to / list presented to : NA      Expected Discharge Plan and Services In-house Referral: NA Discharge Planning Services: CM Consult Post Acute Care Choice: NA Living arrangements for the past 2 months: Single Family Home                 DME Arranged: N/A DME Agency: NA       HH Arranged: NA          Prior Living Arrangements/Services Living arrangements for the past 2 months: Single Family Home Lives with:: Spouse Patient language and need for interpreter reviewed:: Yes Do you feel safe going back to the place where you live?: Yes      Need for Family Participation in Patient Care: Yes (Comment) Care giver support system in place?: Yes (comment)   Criminal Activity/Legal Involvement Pertinent to Current Situation/Hospitalization: No - Comment as needed  Activities of Daily Living   ADL Screening (condition at time of admission) Independently performs ADLs?: Yes (appropriate for developmental age) Is the patient deaf or have difficulty hearing?: No Does the patient have difficulty seeing, even when wearing glasses/contacts?: No Does the patient have difficulty concentrating,  remembering, or making decisions?: No  Permission Sought/Granted Permission sought to share information with : Case Manager Permission granted to share information with : Yes, Verbal Permission Granted              Emotional Assessment Appearance:: Appears stated age Attitude/Demeanor/Rapport: Engaged Affect (typically observed): Appropriate Orientation: : Oriented to Self, Oriented to Place, Oriented to  Time, Oriented to Situation Alcohol / Substance Use: Not Applicable    Admission diagnosis:  NSTEMI (non-ST elevated myocardial infarction) [facility]) [I21.4] Patient Active Problem List   Diagnosis Date Noted   NSTEMI (non-ST elevated myocardial infarction) (HCC) 09/21/2023   Closed fracture of distal end of right radius 03/11/2022   Upper back pain 07/02/2021   Hypercalcemia 05/13/2018   Mixed hyperlipidemia 05/12/2018   Degenerative spondylolisthesis 04/23/2018   Lumbar radiculopathy 04/23/2018   Acquired hypothyroidism 01/25/2017   Elevated LFTs 01/25/2017   Morbid obesity (HCC) 01/25/2017   Essential hypertension 05/21/2016   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 [place] [address] Phone: [phone] Fax: [phone]  [facility]ene Salome Bernerd Venson [address] [place] [address] Phone: [phone] Fax: [phone]     Social Drivers of Health (SDOH) Social History: SDOH Screenings   Food Insecurity: No Food Insecurity (09/22/2023)  Housing: Low Risk  (09/22/2023)  Transportation Needs: No Transportation Needs  (09/22/2023)  Utilities: Not At Risk (09/22/2023)  Financial Resource Strain: Patient Declined (03/26/2023)   Received from [facility]  Physical Activity: Unknown (03/26/2023)   Received from [facility]  Social Connections: Socially Integrated (03/26/2023)   Received from [facility]  Stress: Patient Declined (03/26/2023)   Received from [facility]  Tobacco Use: Low Risk  (09/21/2023)  Recent Concern: Tobacco Use - High Risk (09/21/2023)   Received from [facility] Literacy: Medium Risk (05/03/2021)   Received from [facility]   SDOH Interventions:     Readmission Risk Interventions     No data to display

## 2023-09-22 NOTE — Evaluation (Signed)
 Physical Therapy Evaluation Patient Details Name: Bonnie Martin MRN: [id number] DOB: 04-27-1961 Today's Date: 09/22/2023  History of Present Illness  Bonnie Martin is a 63 y.o. female who presents on 09/21/23 with chest pressure. Pt with NSTEMI. Also + flu A.  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 Pt safe with bed mobility and transfers. Ambulated in room with antalgic gait pattern due to R knee OA but no LOB. HR 76 bpm, SPO2 96% on RA. Will follow pt after cardiac cath to monitor for continued progress. Pt would benefit from outpt cardiac rehab at d/c.  Pt currently with functional limitations due to the deficits listed below (see PT Problem List). Pt will benefit from acute skilled PT to increase their independence and safety with mobility to allow discharge.           If plan is discharge home, recommend the following: Assistance with cooking/housework   Can travel by private vehicle        Equipment Recommendations None recommended by PT  Recommendations for Other Services       Functional Status Assessment Patient has not had a recent decline in their functional status     Precautions / Restrictions Precautions Precautions: None Restrictions Weight Bearing Restrictions Per Provider Order: No      Mobility  Bed Mobility Overal bed mobility: Modified Independent             General bed mobility comments: no difficulty with bed mobility    Transfers Overall transfer level: Modified independent Equipment used: None               General transfer comment: no assist needed for sit>stand    Ambulation/Gait Ambulation/Gait assistance: Contact guard assist Gait Distance (Feet): 25 Feet Assistive device: None Gait Pattern/deviations: Antalgic,  Wide base of support Gait velocity: decreased Gait velocity interpretation: >2.62 ft/sec, indicative of community ambulatory   General Gait Details: pt favors R knee. No LOB with gait. Pt needed to take a sseated rest after 25' due to fatigue but she reports energy level much improved since yesterday. SPO2 96% on RA, HR 76 bpm  Stairs            Wheelchair Mobility     Tilt Bed    Modified Rankin (Stroke Patients Only)       Balance Overall balance assessment: Mild deficits observed, not formally tested                                           Pertinent Vitals/Pain Pain Assessment Pain Assessment: No/denies pain    Home Living Family/patient expects to be discharged to:: Private residence Living Arrangements: Spouse/significant other Available Help at Discharge: Family;Available PRN/intermittently Type of Home: House       Alternate Level Stairs-Number of Steps: flight Home Layout: Two level;Able to live on main level with bedroom/bathroom Home Equipment: None Additional Comments: pt lives with spouse, 2 kids, and a grandchild    Prior Function Prior Level of Function : Independent/Modified Independent;Working/employed;Driving             Mobility Comments: works as a runner, 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 Has had injections. Reports that she needs a TKA RLE Sensation: WNL RLE Coordination: decreased gross motor    Cervical / Trunk Assessment Cervical / Trunk Assessment: Other exceptions Cervical / Trunk Exceptions: increased body habitus  Communication   Communication Communication: No apparent difficulties Cueing Techniques:  Verbal cues  Cognition Arousal: Alert Behavior During Therapy: WFL for tasks assessed/performed Overall Cognitive Status: Within Functional Limits for tasks assessed                                          General Comments General comments (skin integrity, edema, etc.): discussed cardiovascular exercise post MI and possibility of cardiac rehab. Also discussed balance exercises for R knee to increase stability    Exercises     Assessment/Plan    PT Assessment Patient needs continued PT services  PT Problem List Decreased activity tolerance;Decreased mobility       PT Treatment Interventions Gait training;Stair training;Functional mobility training;Therapeutic activities;Therapeutic exercise;Patient/family education    PT Goals (Current goals can be found in the Care Plan section)  Acute Rehab PT Goals Patient Stated Goal: return home and work PT Goal Formulation: With patient Time For Goal Achievement: 10/06/23 Potential to Achieve Goals: Good    Frequency Min 1X/week     Co-evaluation               AM-PAC PT 6 Clicks Mobility  Outcome Measure Help needed turning from your back to your side while in a flat bed without using bedrails?: None Help needed moving from lying on your back to sitting on the side of a flat bed without using bedrails?: None Help needed moving to and from a bed to a chair (including a wheelchair)?: None Help needed standing up from a chair using your arms (e.g., wheelchair or bedside chair)?: None Help needed to walk in hospital room?: A Little Help needed climbing 3-5 steps with a railing? : A Little 6 Click Score: 22    End of Session   Activity Tolerance: Patient tolerated treatment well Patient left: in bed;with call bell/phone within reach Nurse Communication: Mobility status PT Visit Diagnosis: Difficulty in walking, not elsewhere classified (R26.2)    Time: 8595-8575 PT Time Calculation (min) (ACUTE ONLY): 20  min   Charges:   PT Evaluation $PT Eval Moderate Complexity: 1 Mod   PT General Charges $$ ACUTE PT VISIT: 1 Visit         Richerd Lipoma, PT  Acute Rehab Services Secure chat preferred Office [phone]   Richerd CROME [name] 09/22/2023, 2:45 PM

## 2023-09-22 NOTE — H&P (Addendum)
 Cardiology Admission History and Physical   Patient ID: Bonnie Martin MRN: [id number]; DOB: Apr 08, 1961   Admission date: 09/21/2023  PCP:  Lenon Boyer, FNP   [facility] Providers Cardiologist:  None        Chief Complaint: Chest pain  Patient Profile:   Bonnie Martin is a 63 y.o. 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  History of Present Illness:   Ms. Westra says that for the past week or so she has had flulike symptoms for which she was evaluated by her PCP and given a Z-Pak due to concern for pneumonia.  She subsequently tested positive for influenza and started on Tamiflu.  She has felt generalized fatigue over this period time.  Yesterday, the patient developed chest pressure that was central, moderate in intensity, and nonradiating.  No other associated symptoms of fevers, chills, and/V/D, PND, orthopnea, swelling, syncope, or presyncope.  She denies tobacco, alcohol, illicit drug use.  Per chart review, she had an episode of Takotsubo cardiomyopathy in early 2000's, we do not have detailed records of this event.  She says she has had no issues from a cardiac standpoint since this time.  She initially presented to [facility] where she was found to have troponins 2,213 -> 1432.  CXR showed no active cardiopulmonary disease.  ECG showed NSR without clear ischemic changes.  She was loaded with heparin  and transferred to [facility] for management of NSTEMI.  She is currently chest pain-free.   Past Medical History:  Diagnosis Date   Hypertension    Thyroid disease     Past Surgical History:  Procedure Laterality Date   ABDOMINAL HYSTERECTOMY     BREAST EXCISIONAL BIOPSY Left    THYROIDECTOMY, PARTIAL Left      Medications Prior to Admission: Prior to Admission medications   Medication Sig Start Date End Date Taking? Authorizing  Provider  allopurinol  (ZYLOPRIM ) 300 MG tablet Take 300 mg by mouth daily. 03/20/22   [provider]  amLODipine  (NORVASC ) 10 MG tablet Take 1 tablet by mouth daily. 10/21/16 10/21/17  [provider]  colchicine 0.6 MG tablet Take by mouth as directed. 12/30/21   [provider]  furosemide  (LASIX ) 40 MG tablet Take 1 tablet (40 mg total) by mouth 2 (two) times daily. 07/19/22   Raford Lenis, MD  halobetasol (ULTRAVATE) 0.05 % cream Apply topically 2 (two) times daily as needed. 12/30/21   [provider]  ibuprofen  (ADVIL ) 800 MG tablet Take 1 tablet (800 mg total) by mouth 3 (three) times daily. Take with food 06/10/21   Triplett, Tammy, PA-C  isosorbide mononitrate (IMDUR) 30 MG 24 hr tablet Take 30 mg by mouth daily. 04/23/22   [provider]  levothyroxine  (SYNTHROID ) 100 MCG tablet Take 100 mcg by mouth daily. 03/20/22   [provider]  losartan  (COZAAR ) 100 MG tablet Take 1 tablet by mouth daily. 06/19/16 06/19/17  [provider]  methocarbamol  (ROBAXIN ) 500 MG tablet Take 1 tablet (500 mg total) by mouth 3 (three) times daily. Medication may cause drowsiness 06/10/21   Triplett, Tammy, PA-C  metoprolol  (TOPROL -XL) 200 MG 24 hr tablet Take 200 mg by mouth daily. 03/21/22   [provider]  omeprazole  (PRILOSEC) 20 MG capsule Take 2 capsules (40 mg total) by mouth daily. 04/28/22 05/28/22  Hildegard Loge, PA-C  oxyCODONE -acetaminophen  (PERCOCET/ROXICET) 5-325 MG tablet Take 1-2 tablets by mouth every  8 (eight) hours as needed for severe pain. 02/12/22   Patsey Lot, MD  ranolazine  (RANEXA ) 500 MG 12 hr tablet Take 500 mg by mouth 2 (two) times daily. 04/23/22   [provider]  valsartan (DIOVAN) 320 MG tablet Take 320 mg by mouth daily. 03/05/22   [provider]     Allergies:    Allergies  Allergen Reactions   Shellfish Allergy Anaphylaxis   Sulfonamide Derivatives     REACTION: Unknown reaction    Social  History:   Social History   Socioeconomic History   Marital status: Married    Spouse name: Not on file   Number of children: Not on file   Years of education: Not on file   Highest education level: Not on file  Occupational History   Not on file  Tobacco Use   Smoking status: Never   Smokeless tobacco: Never  Vaping Use   Vaping status: Never Used  Substance and Sexual Activity   Alcohol use: No   Drug use: No   Sexual activity: Not on file  Other Topics Concern   Not on file  Social History Narrative   Not on file   Social Drivers of Health   Financial Resource Strain: Patient Declined (03/26/2023)   Received from [facility]   Overall Financial Resource Strain (CARDIA)    Difficulty of Paying Living Expenses: Patient declined  Food Insecurity: Patient Declined (03/26/2023)   Received from [facility]   Hunger Vital Sign    Worried About Running Out of Food in the Last Year: Patient declined    Ran Out of Food in the Last Year: Patient declined  Transportation Needs: Patient Declined (03/26/2023)   Received from [facility] - Transportation    Lack of Transportation (Medical): Patient declined    Lack of Transportation (Non-Medical): Patient declined  Physical Activity: Unknown (03/26/2023)   Received from [facility]   Exercise Vital Sign    Days of Exercise per Week: Patient declined    Minutes of Exercise per Session: Not on file  Stress: Patient Declined (03/26/2023)   Received from [facility] - Occupational Stress Questionnaire    Feeling of Stress : Patient declined  Social Connections: Socially Integrated (03/26/2023)   Received from [facility]   Social Network    How would you rate your social network (family, work, friends)?: Good participation with social networks  Intimate Partner Violence: Not At Risk (03/26/2023)   Received from [facility]   HITS    Over the last 12 months how often  did your partner physically hurt you?: Never    Over the last 12 months how often did your partner insult you or talk down to you?: Never    Over the last 12 months how often did your partner threaten you with physical harm?: Never    Over the last 12 months how often did your partner scream or curse at you?: Never    Family History:   The patient's family history includes Breast cancer (age of onset: 36) in her sister; Heart disease in her father; Kidney disease in her mother.    ROS:  Please see the history of present illness.  All other ROS reviewed and negative.     Physical Exam/Data:   Vitals:   09/21/23 2300 09/22/23 0100 09/22/23 0127 09/22/23 0219  BP: 123/69 113/70  (!) 135/110  Pulse: 66 65  79  Resp: (!) 23 (!) 22  19  Temp:   97.8 F (36.6 C) 98.4 F (36.9 C)  TempSrc:   Oral Oral  SpO2: 95% 97%  98%  Weight:      Height:       No intake or output data in the 24 hours ending 09/22/23 0244    09/21/2023    7:20 PM 09/21/2023    3:19 PM 07/18/2022    7:31 PM  Last 3 Weights  Weight (lbs) 285 lb 15 oz 286 lb 309 lb 1.4 oz  Weight (kg) 129.7 kg 129.729 kg 140.2 kg     Body mass index is 49.08 kg/m.  General:  Well nourished, well developed, in no acute distress, very pleasant HEENT: normal Neck: no JVD Vascular: No carotid bruits; Distal pulses 2+ bilaterally   Cardiac:  normal S1, S2; RRR; no murmur  Lungs:  clear to auscultation bilaterally, no wheezing, rhonchi or rales  Abd: soft, nontender, no hepatomegaly  Ext: no edema Musculoskeletal:  No deformities, BUE and BLE strength normal and equal Skin: warm and dry  Neuro:  CNs 2-12 intact, no focal abnormalities noted Psych:  Normal affect    EKG:  The ECG that was done  was personally reviewed and demonstrates NSR    Relevant CV Studies: N/A  Laboratory Data:  High Sensitivity Troponin:   Recent Labs  Lab 09/21/23 1529 09/21/23 2008  TROPONINIHS 2,213* 1,432*      Chemistry Recent Labs  Lab  09/21/23 1529  NA 141  K 3.7  CL 106  CO2 26  GLUCOSE 109*  BUN 27*  CREATININE 1.27*  CALCIUM  10.6*  GFRNONAA 48*  ANIONGAP 9    Recent Labs  Lab 09/21/23 1529  PROT 7.0  ALBUMIN 4.2  AST 20  ALT 25  ALKPHOS 85  BILITOT 0.8   Lipids No results for input(s): CHOL, TRIG, HDL, LABVLDL, LDLCALC, CHOLHDL in the last 168 hours. Hematology Recent Labs  Lab 09/21/23 1529  WBC 9.2  RBC 5.00  HGB 14.6  HCT 43.0  MCV 86.0  MCH 29.2  MCHC 34.0  RDW 13.1  PLT 264   Thyroid No results for input(s): TSH, FREET4 in the last 168 hours. BNPNo results for input(s): BNP, PROBNP in the last 168 hours.  DDimer No results for input(s): DDIMER in the last 168 hours.   Radiology/Studies:  DG Chest 2 View Result Date: 09/21/2023 CLINICAL DATA:  Shortness of breath.  Influenza. EXAM: CHEST - 2 VIEW COMPARISON:  07/19/2022 FINDINGS: The heart size and mediastinal contours are within normal limits. Both lungs are clear. The visualized skeletal structures are unremarkable. IMPRESSION: No active cardiopulmonary disease. Electronically Signed   By: Norleen DELENA Kil M.D.   On: 09/21/2023 16:08     Assessment and Plan:   ALLURA DOEPKE is a 63 y.o. 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  #NSTEMI :: Multiple risk factors for CAD including obesity, HTN, and age.  Elevated troponins and symptoms concerning for a type I MI.  Agree with heparin .  Unclear if she was loaded at the outside ED similarly low with aspirin .  Plan for LHC. -N.p.o. pending LHC -Continue aspirin  81 mg daily -Start atorvastatin  80 mg daily -Continue heparin  per pharmacy -Complete echo -Troponin, A1c, TSH, LPA  #HTN #Takotsubo CM -Continue home amlodipine , losartan  -Fractionate home metoprolol  succinate to 50 mg every 6   #Hypothyroidism -Continue home  Synthroid  -TSH and free  T4   Risk Assessment/Risk Scores:    TIMI Risk Score for Unstable Angina or Non-ST Elevation MI:   The patient's TIMI risk score is  , which indicates a  % risk of all cause mortality, new or recurrent myocardial infarction or need for urgent revascularization in the next 14 days.      Code Status: Full Code  Severity of Illness: The appropriate patient status for this patient is INPATIENT. Inpatient status is judged to be reasonable and necessary in order to provide the required intensity of service to ensure the patient's safety. The patient's presenting symptoms, physical exam findings, and initial radiographic and laboratory data in the context of their chronic comorbidities is felt to place them at high risk for further clinical deterioration. Furthermore, it is not anticipated that the patient will be medically stable for discharge from the hospital within 2 midnights of admission.   * I certify that at the point of admission it is my clinical judgment that the patient will require inpatient hospital care spanning beyond 2 midnights from the point of admission due to high intensity of service, high risk for further deterioration and high frequency of surveillance required.*   For questions or updates, please contact [facility] Please consult [email or web address] for contact info under     Signed, Georganna Archer, MD  09/22/2023 2:44 AM    Personally seen and examined. Agree with Fellow above with the following comments:  Bonnie Martin, a 63 yo F with a hx of morbid obesity, takotsubo cardiomyopathy and hypothyroidism, presented with worsening shortness of breath following a recent viral illness. The patient was treated with a Z-Pak due to concerns for pneumonia and tested positive for influenza, for which Tamiflu was initiated. Despite these interventions, the patient's dyspnea worsened. The patient's previous Takotsubo event was reportedly triggered by the death of her father. The  patient's primary complaint was profound weakness, with concerns about her ability to navigate her home due to this weakness.  Last admission for this her care was prolonged due to weakness.  Presently she is weak but otherwise well.   The patient was found to have a significant troponin elevation, prompting an evaluation for NSTEMI. Given the patient's history of cardiomyopathy, there was concern that this could be a recurrence.  The patient reported two PVCs on telemetry, but otherwise had bradycardia at night.  Prelim echo images are available:  There was no apical hypokinesis typical for Takotsubo cardiomyopathy on the images available. However, there was relative basal hypokinesis, suggesting a potential benefit from contrast imaging  General:  Morbid obesity, in no acute distress HEENT: normal Neck: no JVD Vascular: No carotid bruits; Radial  pulses 2+ bilaterally   Cardiac:  normal S1, S2; RRR; no murmur  Lungs:  clear to auscultation bilaterally, no wheezing, rhonchi or rales  Abd: soft, nontender, no hepatomegaly  Ext: no edema  A/P:  NSTEMI Significant troponin elevation with worsening shortness of breath and profound weakness. Echocardiogram shows normal ejection fraction, no apical hypokinesis, but relative basal hypokinesis. Consent obtained for left heart catheterization, deemed appropriate given condition. Discussed risks including bleeding, infection, and contrast-induced nephropathy. Benefits include accurate diagnosis and potential intervention. Alternatives include medical management, but less definitive. - Perform left heart catheterization; R radial reasonable - Monitor on telemetry - Transition to metoprolol  succinate tomorrow - was started on ranolazine  last night; will note stop at this time - can eat post cath   Hx of stress Cardiomyopathy  Potential contributor to current symptoms. Echocardiogram shows normal ejection fraction but relative basal hypokinesis -  will await full upload of echo, may start GDMT - she was flu positive at [facility] 09/14/23  Obesity Morbid obesity, risk factor for cardiovascular complications, potentially contributing to current symptoms.  Hypothyroidism Well-managed hypothyroidism. No specific issues discussed during this visit. - has labs ordered will f/u   General Health Maintenance Full code status. - given deconditioning concerns PT/OT  Stanly Leavens, MD FASE [facility] Cardiologist [facility]  [address] [place], [address] [place], [place] [address] [phone]  10:49 AM

## 2023-09-22 NOTE — Plan of Care (Signed)
  Problem: Education: Goal: Knowledge of General Education information will improve Description: Including pain rating scale, medication(s)/side effects and non-pharmacologic comfort measures Outcome: Progressing   Problem: Clinical Measurements: Goal: Diagnostic test results will improve Outcome: Progressing   Problem: Clinical Measurements: Goal: Cardiovascular complication will be avoided Outcome: Progressing   

## 2023-09-22 NOTE — Interval H&P Note (Signed)
 History and Physical Interval Note:  09/22/2023 3:48 PM  Bonnie Martin  has presented today for surgery, with the diagnosis of n-stemi.  The various methods of treatment have been discussed with the patient and family. After consideration of risks, benefits and other options for treatment, the patient has consented to  Procedure(s): LEFT HEART CATH AND CORONARY ANGIOGRAPHY (N/A) as a surgical intervention.  The patient's history has been reviewed, patient examined, no change in status, stable for surgery.  I have reviewed the patient's chart and labs.  Questions were answered to the patient's satisfaction.    Cath Lab Visit (complete for each Cath Lab visit)  Clinical Evaluation Leading to the Procedure:   ACS: Yes.    Non-ACS:    Anginal Classification: CCS III  Anti-ischemic medical therapy: Maximal Therapy (2 or more classes of medications)  Non-Invasive Test Results: No non-invasive testing performed  Prior CABG: No previous CABG        Lonni Cash

## 2023-09-22 NOTE — Progress Notes (Signed)
 TR band removed. Sterile dressing applied. No evidence of bleeding, no hematoma.

## 2023-09-23 ENCOUNTER — Other Ambulatory Visit (HOSPITAL_COMMUNITY): Payer: Self-pay

## 2023-09-23 ENCOUNTER — Encounter (HOSPITAL_COMMUNITY): Payer: Self-pay | Admitting: Cardiovascular Disease

## 2023-09-23 ENCOUNTER — Inpatient Hospital Stay (HOSPITAL_COMMUNITY): Payer: 59

## 2023-09-23 DIAGNOSIS — I214 Non-ST elevation (NSTEMI) myocardial infarction: Secondary | ICD-10-CM | POA: Diagnosis not present

## 2023-09-23 LAB — BASIC METABOLIC PANEL
Anion gap: 6 (ref 5–15)
BUN: 16 mg/dL (ref 6–20)
CO2: 22 mmol/L (ref 22–32)
Calcium: 9.6 mg/dL (ref 8.9–10.3)
Chloride: 111 mmol/L (ref 98–111)
Creatinine, Ser: 1.23 mg/dL — ABNORMAL HIGH (ref 0.44–1.00)
GFR, Estimated: 50 mL/min — ABNORMAL LOW (ref >60)
Glucose, Bld: 112 mg/dL — ABNORMAL HIGH (ref 70–99)
Potassium: 4.1 mmol/L (ref 3.5–5.1)
Sodium: 139 mmol/L (ref 135–145)

## 2023-09-23 LAB — LIPOPROTEIN A (LPA): Lipoprotein (a): 320.1 nmol/L — ABNORMAL HIGH (ref <75.0)

## 2023-09-23 LAB — CBC
HCT: 33.5 % — ABNORMAL LOW (ref 36.0–46.0)
Hemoglobin: 11.2 g/dL — ABNORMAL LOW (ref 12.0–15.0)
MCH: 29.3 pg (ref 26.0–34.0)
MCHC: 33.4 g/dL (ref 30.0–36.0)
MCV: 87.7 fL (ref 80.0–100.0)
Platelets: 187 10*3/uL (ref 150–400)
RBC: 3.82 MIL/uL — ABNORMAL LOW (ref 3.87–5.11)
RDW: 13.1 % (ref 11.5–15.5)
WBC: 6.7 10*3/uL (ref 4.0–10.5)
nRBC: 0 % (ref 0.0–0.2)

## 2023-09-23 LAB — HIV ANTIBODY (ROUTINE TESTING W REFLEX): HIV Screen 4th Generation wRfx: NONREACTIVE

## 2023-09-23 LAB — HEMOGLOBIN A1C
Hgb A1c MFr Bld: 5.7 % — ABNORMAL HIGH (ref 4.8–5.6)
Mean Plasma Glucose: 117 mg/dL

## 2023-09-23 MED ORDER — LORAZEPAM 1 MG PO TABS
1.0000 mg | ORAL_TABLET | Freq: Once | ORAL | Status: AC
  Administered 2023-09-23: 1 mg via ORAL
  Filled 2023-09-23: qty 1

## 2023-09-23 MED ORDER — ROSUVASTATIN CALCIUM 40 MG PO TABS
40.0000 mg | ORAL_TABLET | Freq: Every day | ORAL | 3 refills | Status: DC
  Filled 2023-09-23: qty 90, 90d supply, fill #0

## 2023-09-23 MED ORDER — NITROGLYCERIN 0.4 MG SL SUBL
0.4000 mg | SUBLINGUAL_TABLET | SUBLINGUAL | 1 refills | Status: DC | PRN
  Filled 2023-09-23: qty 25, 7d supply, fill #0

## 2023-09-23 MED ORDER — ROSUVASTATIN CALCIUM 20 MG PO TABS: 20.0000 mg | ORAL_TABLET | Freq: Every day | ORAL | Status: DC

## 2023-09-23 MED ORDER — GADOBUTROL 1 MMOL/ML IV SOLN
10.0000 mL | Freq: Once | INTRAVENOUS | Status: AC | PRN
  Administered 2023-09-23: 10 mL via INTRAVENOUS

## 2023-09-23 MED ORDER — ASPIRIN 81 MG PO TBEC: 81.0000 mg | DELAYED_RELEASE_TABLET | Freq: Every day | ORAL | Status: AC

## 2023-09-23 MED ORDER — METOPROLOL SUCCINATE ER 100 MG PO TB24: 200.0000 mg | ORAL_TABLET | Freq: Every day | ORAL | Status: DC

## 2023-09-23 MED ORDER — ROSUVASTATIN CALCIUM 20 MG PO TABS: 40.0000 mg | ORAL_TABLET | Freq: Every day | ORAL | Status: DC

## 2023-09-23 NOTE — Progress Notes (Addendum)
 Patient Name: Bonnie Martin Date of Encounter: 09/23/2023 [facility] Cardiologist: None   Interval Summary  .    Patient reports feeling well today. No chest pain. Breathing well on room air. Has been able to walk back and forth from the bathroom without dyspnea. Scheduled for cMRI today   Vital Signs .    Vitals:   09/23/23 0529 09/23/23 0635 09/23/23 0644 09/23/23 0750  BP: 129/73 115/73  120/72  Pulse: 72 69  64  Resp: 18   (!) 23  Temp: 98.4 F (36.9 C)   98.9 F (37.2 C)  TempSrc: Oral   Oral  SpO2: 97%   98%  Weight:   126.2 kg   Height:        Intake/Output Summary (Last 24 hours) at 09/23/2023 0859 Last data filed at 09/23/2023 0103 Gross per 24 hour  Intake 1180.46 ml  Output --  Net 1180.46 ml      09/23/2023    6:44 AM 09/22/2023   10:55 AM 09/22/2023    5:53 AM  Last 3 Weights  Weight (lbs) 278 lb 3.5 oz 280 lb 10.3 oz 279 lb 1.6 oz  Weight (kg) 126.2 kg 127.3 kg 126.6 kg      Telemetry/ECG    NSR - Personally Reviewed  Physical Exam .   GEN: No acute distress.  Sitting upright in the bed  Neck: No JVD Cardiac:  RRR, no murmurs, rubs, or gallops. Right radial cath site soft. Mild bruising noted. Mildly tender to palpation. Radial pulses 2+ bilaterally  Respiratory: Clear to auscultation bilaterally. Normal WOB on room air  GI: Soft, nontender, non-distended  MS: No edema in BLE   Assessment & Plan .     NSTEMI  History of Takotsubo Cardiomyopathy  - Patient previously had Takotsubo cardiomyopathy in the early 2000s - Presented to the [facility] ED with chest pressure. Had been diagnosed with the flu earlier this week. HsTn [id number]. She was started on IV heparin , transferred to [facility]  - Echocardiogram yesterday showed EF 60-65%, no apical hypokinesis, but relative basal hypokinesis - Underwent LHC yesterday that showed no angiographic evidence of CAD, LVEDP 5 mmHg  - Scheduled for cMRI today - ordered anxiety medication prior to scan   - Continue ASA 81 mg daily - Increase crestor  to 40 mg daily  - Transition from metoprolol  tartrate to home metoprolol  succinate 200 mg daily   Hypothyroidism  - Both TSH and T4 mildly elevated  - Continue home synthroid  dose for now- recommend outpatient follow up with PCP to adjust dosing   HTN  - Continue amlodipine  10 mg daily  - Transition back to home metoprolol  succinate 200 mg daily   HLD  - Lipid panel this admission showed LDL 117, lipoprotein a 320  - Increase crestor  to 40 mg daily   CKD stage IIIa  - eGFR between 48-52 this admission. Creatinine around 1.2   For questions or updates, please contact [facility] Please consult [email or web address] for contact info under        Signed, Rollo FABIENE Louder, PA-C  Personally seen and examined. Agree with APP above with the following comments: Negative LHC. Asymptomatic.  Wrist is soft. She works as a runner, broadcasting/film/video. 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 Discussed no high intensity exercise until f/u visit. I may start her on colchicine 0.6 mg PO BID if her CMR is positive.  Her CRP is 3.5. Likely DC this evening.  Stanly Leavens, MD FASE  [facility] Cardiologist [facility]  [address] [place], [address] [place], [place] [address] [phone]  12:11 PM

## 2023-09-23 NOTE — TOC Progression Note (Addendum)
 Transition of Care [facility]) - Progression Note    Patient Details  Name: Bonnie Martin MRN: [id number] Date of Birth: Sep 27, 1960  Transition of Care [facility]) CM/SW Contact  Marval Gell, RN Phone Number: 09/23/2023, 10:43 AM  Clinical Narrative:     Rollator to be delivered to the room through [facility] to patient re [facility] recommendation.  She was agreeable to referral, and I spoke w several agencies, her copay for PT will be around $100. When I relayed this information to the patient she declined [facility] services and asked for a referral to outpatient therapy at [facility]. I have placed this referral through [facility] and added it to her AVS.    Expected Discharge Plan: Home/Self Care Barriers to Discharge: No Barriers Identified  Expected Discharge Plan and Services In-house Referral: NA Discharge Planning Services: CM Consult Post Acute Care Choice: NA Living arrangements for the past 2 months: Single Family Home                 DME Arranged: Walker rolling with seat DME Agency: [employer] Date DME Agency Contacted: 09/23/23 Time DME Agency Contacted: 1043 Representative spoke with at DME Agency: [place] [facility] Arranged: NA           Social Determinants of Health (SDOH) Interventions SDOH Screenings   Food Insecurity: No Food Insecurity (09/22/2023)  Housing: Low Risk  (09/22/2023)  Transportation Needs: No Transportation Needs (09/22/2023)  Utilities: Not At Risk (09/22/2023)  Financial Resource Strain: Patient Declined (03/26/2023)   Received from [facility]  Physical Activity: Unknown (03/26/2023)   Received from [facility]  Social Connections: Socially Integrated (03/26/2023)   Received from [facility]  Stress: Patient Declined (03/26/2023)   Received from [facility]  Tobacco Use: Low Risk  (09/21/2023)  Recent Concern: Tobacco Use - High Risk (09/21/2023)   Received from [facility]  Health Literacy: Medium Risk (05/03/2021)   Received from [facility]    Readmission Risk Interventions     No data to display

## 2023-09-23 NOTE — Plan of Care (Signed)

## 2023-09-23 NOTE — Discharge Summary (Signed)
 Discharge Summary    Patient ID: Bonnie Martin MRN: [id number]; DOB: [birth year]  Admit date: 09/21/2023 Discharge date: 09/23/2023  PCP:  Lenon Boyer, FNP   [facility] Providers Cardiologist:  Stanly DELENA Leavens, MD     Discharge Diagnoses    Principal Problem:   NSTEMI (non-ST elevated myocardial infarction) [facility]) Active Problems:   Acquired hypothyroidism   Essential hypertension   Mixed hyperlipidemia    Diagnostic Studies/Procedures    Echocardiogram 09/22/23  1. Left ventricular ejection fraction, by estimation, is 60 to 65%. The  left ventricle has normal function. The left ventricle has no regional  wall motion abnormalities. There is mild concentric left ventricular  hypertrophy. Left ventricular diastolic  parameters are consistent with Grade I diastolic dysfunction (impaired  relaxation).   2. Right ventricular systolic function is normal. The right ventricular  size is normal. Tricuspid regurgitation signal is inadequate for assessing  PA pressure.   3. The mitral valve is grossly normal. No evidence of mitral valve  regurgitation. No evidence of mitral stenosis.   4. The aortic valve is tricuspid. Aortic valve regurgitation is trivial.  Aortic valve sclerosis is present, with no evidence of aortic valve  stenosis.   5. The inferior vena cava is normal in size with greater than 50%  respiratory variability, suggesting right atrial pressure of 3 mmHg.   Left Heart Catheterization 09/22/23 No angiographic evidence of CAD LVEDP=5 mmHg   Recommendations: No further ischemic workup  Cardiac MRI 09/23/23 IMPRESSION: 1.  Imaging criteria negative for myocarditis.   2.  Normal ventricular function.  _____________   History of Present Illness     Bonnie Martin is a 63 y.o. 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  [facility] on 09/21/2023  complaining of chest pressure.  Patient reported that for the past week or so, she had been having flulike symptoms.  She had been evaluated by her PCP and was given a Z-Pak due to concern for pneumonia.  She subsequently tested positive for influenza and was started on Tamiflu.  During this time, she had generalized fatigue.  On 1/6, patient developed chest pressure that was central, moderate in intensity, and not radiating.  Denied fever, chills, N/V/D, PND, orthopnea, swelling, syncope, or presyncope.  Patient denied tobacco, alcohol, or illicit drug use.  Patient reported that she had an episode of Takotsubo cardiomyopathy in early 2000's, denied any other past cardiac history.  In the ED, she was found to have troponins 2213> 1432.  Chest x-ray showed no active cardiopulmonary disease.  EKG showed normal sinus rhythm without clear ischemic changes.  Patient was loaded with heparin , transferred to [facility] for management of suspected NSTEMI.  On arrival to [facility], she was chest pain free.  Hospital Course     Consultants: None  Patient was admitted to the cardiology service at [facility] 1/7 for treatment of suspected NSTEMI.  Remained on IV heparin .  Underwent echocardiogram on 1/7 that showed EF 60-65%, no regional wall motion abnormalities, mild LVH, grade 1 diastolic dysfunction, normal RV function.  She underwent left heart catheterization on 1/7 that showed no angiographic evidence of CAD.  LVEDP was 5 mmHg.  On 1/8, patient underwent cardiac MRI.  Read by Dr. Leavens.  Imaging criteria negative for myocarditis, normal ventricular function.  Patient was seen and examined by Dr. Leavens and was deemed stable to discharge. Patient was instructed  not to participate in any strenuous activities such as competitive sports.   Patient has follow up appointment on 10/05/23.   NSTEMI  Chest Pain  History of Takotsubo Cardiomyopathy  - Patient previously had Takotsubo  cardiomyopathy in the early 2000s - Presented to the [facility] ED on 1/6 with chest pressure. Had been diagnosed with the flu the week prior. HsTn [id number]. She was started on IV heparin , transferred to [facility]  - Echocardiogram 1/7 showed EF 60-65%, no apical hypokinesis, but relative basal hypokinesis  - Underwent LHC 1/7 that showed no angiographic evidence of CAD, LVEDP 5 mmHg  -Underwent cardiac MRI on 1/8 that showed no evidence of myocarditis, normal ventricular function. - Continue ASA 81 mg daily - Increase crestor  to 40 mg daily  -Continue metoprolol  succinate 200 mg daily    Hypothyroidism  - Both TSH and T4 mildly elevated  - Continue home synthroid  dose for now- recommend outpatient follow up with PCP to adjust dosing    HTN  - Continue amlodipine  10 mg daily, metoprolol  succinate 200 mg daily, valsartan 320 mg daily  - K 4.1, creatinine 1.23 at DC    HLD  - Lipid panel this admission showed LDL 117, lipoprotein a 320  - Increased crestor  to 40 mg daily  - Needs lipid panel, LFTs in 8 weeks    CKD stage IIIa  - eGFR between 48-52 this admission. Creatinine around 1.2   Did the patient have an acute coronary syndrome (MI, NSTEMI, STEMI, etc) this admission?:  Yes                               AHA/ACC ACS Clinical Performance & Quality Measures: Aspirin  prescribed? - Yes ADP Receptor Inhibitor (Plavix/Clopidogrel, Brilinta/Ticagrelor or Effient/Prasugrel) prescribed (includes medically managed patients)? - No - No stents placed during cath  Beta Blocker prescribed? - Yes High Intensity Statin (Lipitor  40-80mg  or Crestor  20-40mg ) prescribed? - Yes EF assessed during THIS hospitalization? - Yes For EF <40%, was ACEI/ARB prescribed? - Not Applicable (EF >/= 40%) For EF <40%, Aldosterone Antagonist (Spironolactone or Eplerenone) prescribed? - Not Applicable (EF >/= 40%) Cardiac Rehab Phase II ordered (including medically managed patients)? - Yes       _____________  Discharge Vitals Blood pressure (!) 135/98, pulse 77, temperature 98.4 F (36.9 C), temperature source Oral, resp. rate 18, height 5' 4 (1.626 m), weight 126.2 kg, SpO2 100%.  Filed Weights   09/22/23 0553 09/22/23 1055 09/23/23 0644  Weight: 126.6 kg 127.3 kg 126.2 kg    Labs & Radiologic Studies    CBC Recent Labs    09/22/23 0245 09/23/23 0333  WBC 7.9 6.7  HGB 13.5 11.2*  HCT 39.6 33.5*  MCV 87.0 87.7  PLT 240 187   Basic Metabolic Panel Recent Labs    98/92/74 0308 09/23/23 0333  NA 141 139  K 3.8 4.1  CL 108 111  CO2 22 22  GLUCOSE 127* 112*  BUN 20 16  CREATININE 1.20* 1.23*  CALCIUM  10.5* 9.6  MG 2.5*  --    Liver Function Tests Recent Labs    09/21/23 1529  AST 20  ALT 25  ALKPHOS 85  BILITOT 0.8  PROT 7.0  ALBUMIN 4.2   No results for input(s): LIPASE, AMYLASE in the last 72 hours. High Sensitivity Troponin:   Recent Labs  Lab 09/21/23 1529 09/21/23 2008  TROPONINIHS 2,213* 1,432*    BNP Invalid input(s): POCBNP  D-Dimer No results for input(s): DDIMER in the last 72 hours. Hemoglobin A1C Recent Labs    09/22/23 0756  HGBA1C 5.7*   Fasting Lipid Panel Recent Labs    09/22/23 0308  CHOL 204*  HDL 65  LDLCALC 117*  TRIG 112  CHOLHDL 3.1   Thyroid Function Tests Recent Labs    09/22/23 0308  TSH 6.886*   _____________  MR CARDIAC MORPHOLOGY W WO CONTRAST Result Date: 09/23/2023 CLINICAL DATA:  Clinical question of myocarditis. Study assumes HCT of 33 and BSA of 2.39 m2. EXAM: CARDIAC MRI TECHNIQUE: The patient was scanned on a 1.5 Tesla GE magnet. A dedicated cardiac coil was used. Functional imaging was done using Fiesta sequences. 2,3, and 4 chamber views were done to assess for RWMA's. Modified Simpson's rule using a short axis stack was used to calculate an ejection fraction on a dedicated work [employer]. The patient received 10 cc of Gadavist . After 10 minutes inversion  recovery sequences were used to assess for infiltration and scar tissue. Flow quantification was performed 2 times during this examination with flow quantification performed at the levels of the ascending aorta above the valve, pulmonary artery above the valve. CONTRAST:  10 cc  of Gadavist  FINDINGS: 1. Normal left ventricular size, with LVEDD 50 mm, and LVEDVi 54 mL/m2. Normal left ventricular thickness. Normal left ventricular systolic function (LVEF =66%). There are no regional wall motion abnormalities. Left ventricular parametric mapping notable for normal ECV and T2. There is no late gadolinium enhancement in the left ventricular myocardium. 2. Normal right ventricular size with RVEDVI 56 mL/m2. Normal right ventricular thickness. Normal right ventricular systolic function (RVEF =54%). There are no regional wall motion abnormalities or aneurysms. 3.  Normal right atrial size.  Mild left atrial enlargement. 4.  Moderate pulmonary artery dilation 33 mm. Normal aortic measurements. 5. Valve assessment: Aortic Valve: Mild aortic regurgitation. Regurgitant fraction 6%. Tri-leaflet valve. Pulmonic Valve: No significant pulmonic insufficieny Tricuspid Valve: Mild tricuspid regurgitation. Regurgitant volume 5%. Mitral Valve: No significant mitral insufficieny. 6. Normal pericardium. No pericardial effusion. No pericardial enhancement or thickening. 7. Grossly, no extracardiac findings. Recommended dedicated study if concerned for non-cardiac pathology. IMPRESSION: 1.  Imaging criteria negative for myocarditis. 2.  Normal ventricular function. Stanly Leavens MD Electronically Signed   By: Stanly Leavens M.D.   On: 09/23/2023 15:20   MR CARDIAC VELOCITY FLOW MAP Result Date: 09/23/2023 CLINICAL DATA:  Clinical question of myocarditis. Study assumes HCT of 33 and BSA of 2.39 m2. EXAM: CARDIAC MRI TECHNIQUE: The patient was scanned on a 1.5 Tesla GE magnet. A dedicated cardiac coil was used. Functional  imaging was done using Fiesta sequences. 2,3, and 4 chamber views were done to assess for RWMA's. Modified Simpson's rule using a short axis stack was used to calculate an ejection fraction on a dedicated work [employer]. The patient received 10 cc of Gadavist . After 10 minutes inversion recovery sequences were used to assess for infiltration and scar tissue. Flow quantification was performed 2 times during this examination with flow quantification performed at the levels of the ascending aorta above the valve, pulmonary artery above the valve. CONTRAST:  10 cc  of Gadavist  FINDINGS: 1. Normal left ventricular size, with LVEDD 50 mm, and LVEDVi 54 mL/m2. Normal left ventricular thickness. Normal left ventricular systolic function (LVEF =66%). There are no regional wall motion abnormalities. Left ventricular parametric mapping notable for normal ECV and T2. There is no late gadolinium enhancement  in the left ventricular myocardium. 2. Normal right ventricular size with RVEDVI 56 mL/m2. Normal right ventricular thickness. Normal right ventricular systolic function (RVEF =54%). There are no regional wall motion abnormalities or aneurysms. 3.  Normal right atrial size.  Mild left atrial enlargement. 4.  Moderate pulmonary artery dilation 33 mm. Normal aortic measurements. 5. Valve assessment: Aortic Valve: Mild aortic regurgitation. Regurgitant fraction 6%. Tri-leaflet valve. Pulmonic Valve: No significant pulmonic insufficieny Tricuspid Valve: Mild tricuspid regurgitation. Regurgitant volume 5%. Mitral Valve: No significant mitral insufficieny. 6. Normal pericardium. No pericardial effusion. No pericardial enhancement or thickening. 7. Grossly, no extracardiac findings. Recommended dedicated study if concerned for non-cardiac pathology. IMPRESSION: 1.  Imaging criteria negative for myocarditis. 2.  Normal ventricular function. Stanly Leavens MD Electronically Signed   By: Stanly Leavens  M.D.   On: 09/23/2023 15:20   MR CARDIAC VELOCITY FLOW MAP Result Date: 09/23/2023 CLINICAL DATA:  Clinical question of myocarditis. Study assumes HCT of 33 and BSA of 2.39 m2. EXAM: CARDIAC MRI TECHNIQUE: The patient was scanned on a 1.5 Tesla GE magnet. A dedicated cardiac coil was used. Functional imaging was done using Fiesta sequences. 2,3, and 4 chamber views were done to assess for RWMA's. Modified Simpson's rule using a short axis stack was used to calculate an ejection fraction on a dedicated work [employer]. The patient received 10 cc of Gadavist . After 10 minutes inversion recovery sequences were used to assess for infiltration and scar tissue. Flow quantification was performed 2 times during this examination with flow quantification performed at the levels of the ascending aorta above the valve, pulmonary artery above the valve. CONTRAST:  10 cc  of Gadavist  FINDINGS: 1. Normal left ventricular size, with LVEDD 50 mm, and LVEDVi 54 mL/m2. Normal left ventricular thickness. Normal left ventricular systolic function (LVEF =66%). There are no regional wall motion abnormalities. Left ventricular parametric mapping notable for normal ECV and T2. There is no late gadolinium enhancement in the left ventricular myocardium. 2. Normal right ventricular size with RVEDVI 56 mL/m2. Normal right ventricular thickness. Normal right ventricular systolic function (RVEF =54%). There are no regional wall motion abnormalities or aneurysms. 3.  Normal right atrial size.  Mild left atrial enlargement. 4.  Moderate pulmonary artery dilation 33 mm. Normal aortic measurements. 5. Valve assessment: Aortic Valve: Mild aortic regurgitation. Regurgitant fraction 6%. Tri-leaflet valve. Pulmonic Valve: No significant pulmonic insufficieny Tricuspid Valve: Mild tricuspid regurgitation. Regurgitant volume 5%. Mitral Valve: No significant mitral insufficieny. 6. Normal pericardium. No pericardial effusion. No  pericardial enhancement or thickening. 7. Grossly, no extracardiac findings. Recommended dedicated study if concerned for non-cardiac pathology. IMPRESSION: 1.  Imaging criteria negative for myocarditis. 2.  Normal ventricular function. Stanly Leavens MD Electronically Signed   By: Stanly Leavens M.D.   On: 09/23/2023 15:20   CARDIAC CATHETERIZATION Result Date: 09/22/2023 No angiographic evidence of CAD LVEDP=5 mmHg Recommendations: No further ischemic workup   ECHOCARDIOGRAM COMPLETE Result Date: 09/22/2023    ECHOCARDIOGRAM REPORT   Patient Name:   Bonnie Martin Date of Exam: 09/22/2023 Medical Rec #:  [id number]            Height:       64.0 in Accession #:    [id number]           Weight:       279.1 lb Date of Birth:  1961-03-17           BSA:  2.254 m Patient Age:    62 years             BP:           116/69 mmHg Patient Gender: F                    HR:           72 bpm. Exam Location:  Inpatient Procedure: 2D Echo, Cardiac Doppler, Color Doppler and Intracardiac            Opacification Agent Indications:    NSTEMI  History:        Patient has no prior history of Echocardiogram examinations.                 NSTEMI; Risk Factors:Hypertension and HLD.  Sonographer:    Lanell Maduro Referring Phys: [id number] LONNIE SULLIVAN IMPRESSIONS  1. Left ventricular ejection fraction, by estimation, is 60 to 65%. The left ventricle has normal function. The left ventricle has no regional wall motion abnormalities. There is mild concentric left ventricular hypertrophy. Left ventricular diastolic parameters are consistent with Grade I diastolic dysfunction (impaired relaxation).  2. Right ventricular systolic function is normal. The right ventricular size is normal. Tricuspid regurgitation signal is inadequate for assessing PA pressure.  3. The mitral valve is grossly normal. No evidence of mitral valve regurgitation. No evidence of mitral stenosis.  4. The aortic valve is tricuspid. Aortic  valve regurgitation is trivial. Aortic valve sclerosis is present, with no evidence of aortic valve stenosis.  5. The inferior vena cava is normal in size with greater than 50% respiratory variability, suggesting right atrial pressure of 3 mmHg. FINDINGS  Left Ventricle: Left ventricular ejection fraction, by estimation, is 60 to 65%. The left ventricle has normal function. The left ventricle has no regional wall motion abnormalities. Definity  contrast agent was given IV to delineate the left ventricular  endocardial borders. The left ventricular internal cavity size was normal in size. There is mild concentric left ventricular hypertrophy. Left ventricular diastolic parameters are consistent with Grade I diastolic dysfunction (impaired relaxation). Right Ventricle: The right ventricular size is normal. No increase in right ventricular wall thickness. Right ventricular systolic function is normal. Tricuspid regurgitation signal is inadequate for assessing PA pressure. Left Atrium: Left atrial size was normal in size. Right Atrium: Right atrial size was normal in size. Pericardium: There is no evidence of pericardial effusion. Mitral Valve: The mitral valve is grossly normal. No evidence of mitral valve regurgitation. No evidence of mitral valve stenosis. Tricuspid Valve: The tricuspid valve is grossly normal. Tricuspid valve regurgitation is trivial. No evidence of tricuspid stenosis. Aortic Valve: The aortic valve is tricuspid. Aortic valve regurgitation is trivial. Aortic valve sclerosis is present, with no evidence of aortic valve stenosis. Pulmonic Valve: The pulmonic valve was grossly normal. Pulmonic valve regurgitation is not visualized. No evidence of pulmonic stenosis. Aorta: The aortic root is normal in size and structure. Venous: The inferior vena cava is normal in size with greater than 50% respiratory variability, suggesting right atrial pressure of 3 mmHg. IAS/Shunts: The atrial septum is grossly  normal.  LEFT VENTRICLE PLAX 2D LVIDd:         2.45 cm      Diastology LVIDs:         2.20 cm      LV e' medial:    4.35 cm/s LV PW:         2.20 cm  LV E/e' medial:  10.6 LV IVS:        1.20 cm      LV e' lateral:   8.86 cm/s LVOT diam:     2.00 cm      LV E/e' lateral: 5.2 LV SV:         61 LV SV Index:   27 LVOT Area:     3.14 cm  LV Volumes (MOD) LV vol d, MOD A2C: 110.0 ml LV vol d, MOD A4C: 97.1 ml LV vol s, MOD A2C: 29.8 ml LV vol s, MOD A4C: 45.3 ml LV SV MOD A2C:     80.2 ml LV SV MOD A4C:     97.1 ml LV SV MOD BP:      67.9 ml RIGHT VENTRICLE            IVC RV Basal diam:  3.40 cm    IVC diam: 1.30 cm RV S prime:     7.62 cm/s TAPSE (M-mode): 1.5 cm LEFT ATRIUM             Index        RIGHT ATRIUM          Index LA diam:        3.40 cm 1.51 cm/m   RA Area:     9.71 cm LA Vol (A2C):   56.6 ml 25.11 ml/m  RA Volume:   18.90 ml 8.38 ml/m LA Vol (A4C):   33.7 ml 14.95 ml/m LA Biplane Vol: 44.9 ml 19.92 ml/m  AORTIC VALVE LVOT Vmax:   117.00 cm/s LVOT Vmean:  73.900 cm/s LVOT VTI:    0.194 m  AORTA Ao Root diam: 3.30 cm MITRAL VALVE MV Area (PHT): 2.76 cm    SHUNTS MV Decel Time: 275 msec    Systemic VTI:  0.19 m MV E velocity: 46.10 cm/s  Systemic Diam: 2.00 cm MV A velocity: 76.60 cm/s MV E/A ratio:  0.60 Darryle Decent MD Electronically signed by Darryle Decent MD Signature Date/Time: 09/22/2023/11:43:37 AM    Final    DG Chest 2 View Result Date: 09/21/2023 CLINICAL DATA:  Shortness of breath.  Influenza. EXAM: CHEST - 2 VIEW COMPARISON:  07/19/2022 FINDINGS: The heart size and mediastinal contours are within normal limits. Both lungs are clear. The visualized skeletal structures are unremarkable. IMPRESSION: No active cardiopulmonary disease. Electronically Signed   By: Norleen DELENA Kil M.D.   On: 09/21/2023 16:08   Disposition   Pt is being discharged home today in good condition.  Follow-up Plans & Appointments     Follow-up Information     [facility] at  [facility] Follow up.   Specialty: Rehabilitation Why: If you don't hear from them in a week, please give them a call to schedule a physical therapy session. Contact information: [address] [place]  [phone]               Discharge Instructions     AMB referral to Phase II Cardiac Rehabilitation   Complete by: As directed    Diagnosis: NSTEMI   After initial evaluation and assessments completed: Virtual Based Care may be provided alone or in conjunction with Phase 2 Cardiac Rehab based on patient barriers.: Yes   Intensive Cardiac Rehabilitation (ICR) [facility] location only OR Traditional Cardiac Rehabilitation (TCR) *If criteria for ICR are not met will enroll in TCR [facility] only): Yes   Diet - low sodium heart healthy   Complete by: As  directed    Discharge instructions   Complete by: As directed    Radial Site Care Refer to this sheet in the next few weeks. These instructions provide you with information on caring for yourself after your procedure. Your caregiver may also give you more specific instructions. Your treatment has been planned according to current medical practices, but problems sometimes occur. Call your caregiver if you have any problems or questions after your procedure. HOME CARE INSTRUCTIONS You may shower the day after the procedure. Remove the bandage (dressing) and gently wash the site with plain soap and water. Gently pat the site dry.  Do not apply powder or lotion to the site.  Do not submerge the affected site in water for 3 to 5 days.  Inspect the site at least twice daily.  Do not flex or bend the affected arm for 24 hours.  No lifting over 5 pounds (2.3 kg) for 5 days after your procedure.  Do not drive home if you are discharged the same day of the procedure. Have someone else drive you.  You may drive 24 hours after the procedure unless otherwise instructed by your caregiver.  What to expect: Any bruising will usually  fade within 1 to 2 weeks.  Blood that collects in the tissue (hematoma) may be painful to the touch. It should usually decrease in size and tenderness within 1 to 2 weeks.  SEEK IMMEDIATE MEDICAL CARE IF: You have unusual pain at the radial site.  You have redness, warmth, swelling, or pain at the radial site.  You have drainage (other than a small amount of blood on the dressing).  You have chills.  You have a fever or persistent symptoms for more than 72 hours.  You have a fever and your symptoms suddenly get worse.  Your arm becomes pale, cool, tingly, or numb.  You have heavy bleeding from the site. Hold pressure on the site.   Increase activity slowly   Complete by: As directed         Discharge Medications   Allergies as of 09/23/2023       Reactions   Shellfish Allergy Anaphylaxis   Sulfonamide Derivatives    REACTION: Unknown reaction        Medication List     STOP taking these medications    oseltamivir 75 MG capsule Commonly known as: TAMIFLU       TAKE these medications    Acetaminophen  Extra Strength 500 MG Tabs Take 500-1,000 mg by mouth 3 (three) times daily as needed (Pain).   allopurinol  300 MG tablet Commonly known as: ZYLOPRIM  Take 300 mg by mouth every evening.   amLODipine  10 MG tablet Commonly known as: NORVASC  Take 10 mg by mouth daily.   aspirin  EC 81 MG tablet Take 1 tablet (81 mg total) by mouth daily. Swallow whole. Start taking on: September 24, 2023 What changed:  medication strength how much to take when to take this reasons to take this additional instructions   colchicine 0.6 MG tablet Take 0.6 mg by mouth daily as needed (Gout).   diclofenac  50 MG EC tablet Commonly known as: VOLTAREN Take 50 mg by mouth 2 (two) times daily as needed for moderate pain (pain score 4-6).   fluticasone 50 MCG/ACT nasal spray Commonly known as: FLONASE Place 1 spray into both nostrils daily as needed for allergies or rhinitis.    furosemide  20 MG tablet Commonly known as: LASIX  Take 20 mg by mouth 2 (two) times  daily.   halobetasol 0.05 % cream Commonly known as: ULTRAVATE Apply 1 Application topically daily.   hydrocortisone 2.5 % rectal cream Commonly known as: ANUSOL-HC Place 1 Application rectally 2 (two) times daily as needed for hemorrhoids or anal itching.   levothyroxine  100 MCG tablet Commonly known as: SYNTHROID  Take 100 mcg by mouth daily.   methocarbamol  500 MG tablet Commonly known as: ROBAXIN  Take 1 tablet (500 mg total) by mouth 3 (three) times daily. Medication may cause drowsiness What changed:  when to take this reasons to take this additional instructions   metoprolol  200 MG 24 hr tablet Commonly known as: TOPROL -XL Take 200 mg by mouth daily.   nitroGLYCERIN  0.4 MG SL tablet Commonly known as: NITROSTAT  Place 1 tablet (0.4 mg total) under the tongue every 5 (five) minutes x 3 doses as needed for chest pain.   omeprazole  40 MG capsule Commonly known as: PRILOSEC Take 40 mg by mouth daily as needed (Indigestion).   rosuvastatin  40 MG tablet Commonly known as: CRESTOR  Take 1 tablet (40 mg total) by mouth daily. Start taking on: September 24, 2023 What changed:  medication strength how much to take when to take this   valsartan 320 MG tablet Commonly known as: DIOVAN Take 320 mg by mouth daily.               Durable Medical Equipment  (From admission, onward)           Start     Ordered   09/23/23 1002  For home use only DME 4 wheeled rolling walker with seat  Once       Comments: Requesting wider Rollator to accommodate pt's hip width.  Question:  Patient needs a walker to treat with the following condition  Answer:  Difficulty walking   09/23/23 1002               Outstanding Labs/Studies    Duration of Discharge Encounter: APP Time: 27 minutes   Signed, Rollo FABIENE Louder, PA-C 09/23/2023, 4:48 PM

## 2023-09-23 NOTE — Progress Notes (Signed)
 Physical Therapy Treatment Patient Details Name: Bonnie Martin MRN: [id number] DOB: 1961/02/11 Today's Date: 09/23/2023   History of Present Illness ISSABELA LESKO is a 63 y.o. female who presents on 09/21/23 with chest pressure. Pt with NSTEMI. Also + flu A.  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    PT Comments  Pt eager to mobilize and reporting feels less fatigued compared to prior day. Pt completing bed mob and transfers with no AD at Mod Ind level with no cues needed for safety. Pt was able to amb increased distance of ~50' with standing break at ~40'. Education provided on rollator for gait and pt amb short bout in room and completed safe sit<>stand from rollator seat for functional rest break. Will continue to progress pt as able with recommendation for rollator to facilitate community mobility and cardiac rehab for endurance training and to address limitations.     If plan is discharge home, recommend the following: Assistance with cooking/housework   Can travel by [employer] (4 wheels)    Recommendations for Other Services       Precautions / Restrictions Precautions Precautions: None Restrictions Weight Bearing Restrictions Per Provider Order: No     Mobility  Bed Mobility Overal bed mobility: Modified Independent             General bed mobility comments: no difficulty with bed mobility, use of bed features    Transfers Overall transfer level: Modified independent Equipment used: None               General transfer comment: no assist needed for sit>stand, use of hands    Ambulation/Gait Ambulation/Gait assistance: Contact guard assist, Supervision Gait Distance (Feet): 50 Feet Assistive device: None, Rollator (4 wheels) Gait Pattern/deviations: Antalgic, Wide base of support, Decreased stride length Gait velocity:  decreased     General Gait Details: pt steady with no AD for gait of hallway distance ~50'. Pt fatiguing quickly and reporting 3/4 of DOE scale and required standing rest break at ~40' and amb back to EOB for seated rest. Educated on benefits of Rollator. pt amb short distance in room and completed sit<>stand with rolaltor for gait training. No LOB and CGA for safety.   Stairs             Wheelchair Mobility     Tilt Bed    Modified Rankin (Stroke Patients Only)       Balance Overall balance assessment: Mild deficits observed, not formally tested                                          Cognition Arousal: Alert Behavior During Therapy: WFL for tasks assessed/performed Overall Cognitive Status: Within Functional Limits for tasks assessed                                          Exercises      General Comments        Pertinent Vitals/Pain Pain Assessment Pain Assessment: No/denies pain    Home Living  Prior Function            PT Goals (current goals can now be found in the care plan section) Acute Rehab PT Goals Patient Stated Goal: return home and work PT Goal Formulation: With patient Time For Goal Achievement: 10/06/23 Potential to Achieve Goals: Good Progress towards PT goals: Progressing toward goals    Frequency    Min 1X/week      PT Plan      Co-evaluation              AM-PAC PT 6 Clicks Mobility   Outcome Measure  Help needed turning from your back to your side while in a flat bed without using bedrails?: None Help needed moving from lying on your back to sitting on the side of a flat bed without using bedrails?: None Help needed moving to and from a bed to a chair (including a wheelchair)?: None Help needed standing up from a chair using your arms (e.g., wheelchair or bedside chair)?: None Help needed to walk in hospital room?: A Little Help needed  climbing 3-5 steps with a railing? : A Little 6 Click Score: 22    End of Session Equipment Utilized During Treatment: Gait belt Activity Tolerance: Patient tolerated treatment well Patient left: in bed;with call bell/phone within reach Nurse Communication: Mobility status PT Visit Diagnosis: Difficulty in walking, not elsewhere classified (R26.2)     Time: 9067-9044 PT Time Calculation (min) (ACUTE ONLY): 23 min  Charges:    $Gait Training: 8-22 mins $Therapeutic Activity: 8-22 mins PT General Charges $$ ACUTE PT VISIT: 1 Visit                     Vernell DONEEN KLEIN, DPT Acute Rehabilitation Services Office [phone]  09/23/23 12:15 PM

## 2023-10-01 ENCOUNTER — Ambulatory Visit: Payer: 59

## 2023-10-04 NOTE — Progress Notes (Unsigned)
Cardiology Office Note    Patient Name: Bonnie Martin Date of Encounter: 10/05/2023  Primary Care Provider:  Elizabeth Palau, FNP Primary Cardiologist:  Christell Constant, MD Primary Electrophysiologist: None   Past Medical History    Past Medical History:  Diagnosis Date   Hypertension    Thyroid disease     History of Present Illness  Bonnie Martin is a 63 y.o. female with a PMH of CAD s/p Takotsubo cardiomyopathy 2002, GERD, HTN, hypothyroid, HTN, HLD, CKD stage IIIa who presents today for post LHC follow-up.  Bonnie Martin was seen initially by Dr. Eldridge Dace in 2023 for complaint of chest pain.  She was previously diagnosed with Takotsubo cardiomyopathy in 2002.  She was admitted on 09/22/2023 with flulike symptoms and chest pressure.  She initially presented to [facility] and troponins were elevated at 2213 with chest x-ray showing no active cardiopulmonary disease.  EKG was completed showing no clear ischemic changes.  2D echo was completed showing EF of 60 to 65% with no RWMA and mild concentric LVH and no apical hypokinesis but basal hypokinesis with grade 1 DD and no significant valve abnormalities.  She was transferred to [facility] and underwent LHC by Dr. Clifton James on 09/22/2023 that showed no obstructive disease.  She underwent a cardiac MRI that showed normal ventricular function and no evidence of myocarditis.   [name] presents today for post catheterization follow-up.   She was recently admitted to the hospital with flu-like symptoms and chest pressure. Elevated troponin levels led to a transfer to another hospital for a heart catheterization, which showed no obstructive disease. However, an echocardiogram showed hypokinesis at the basal tip of the heart. An MRI ruled out myocarditis.  She is currently on Norvasc, Lasix, Metoprolol, Crestor, and Valsartan. The patient's lipoprotein A levels were elevated, indicating a genetic predisposition for  coronary disease.  She also has a lingering cough and has been taking Mucinex DM. The patient has also been experiencing shortness of breath and fatigue, which may be related to the recent illness or the medications.  She also has a history of thyroid issues and is currently taking medication for it. She also reports some hair thinning and brittle nails, which may be related to the thyroid issues. Patient denies chest pain, palpitations, dyspnea, PND, orthopnea, nausea, vomiting, dizziness, syncope, edema, weight gain, or early satiety.   Review of Systems  Please see the history of present illness.    All other systems reviewed and are otherwise negative except as noted above.  Physical Exam    Wt Readings from Last 3 Encounters:  10/05/23 275 lb (124.7 kg)  09/23/23 278 lb 3.5 oz (126.2 kg)  07/18/22 (!) 309 lb 1.4 oz (140.2 kg)   VS: Vitals:   10/05/23 1511  BP: 118/62  Pulse: 64  SpO2: 99%  ,Body mass index is 47.2 kg/m. GEN: Well nourished, well developed in no acute distress Neck: No JVD; No carotid bruits Pulmonary: Clear to auscultation without rales, wheezing or rhonchi  Cardiovascular: Normal rate. Regular rhythm. Normal S1. Normal S2.   Murmurs: There is no murmur.  ABDOMEN: Soft, non-tender, non-distended EXTREMITIES:  No edema; No deformity   EKG/LABS/ Recent Cardiac Studies   ECG personally reviewed by me today -none completed today  Risk Assessment/Calculations:          Lab Results  Component Value Date   WBC 6.7 09/23/2023   HGB 11.2 (L) 09/23/2023   HCT 33.5 (L) 09/23/2023  MCV 87.7 09/23/2023   PLT 187 09/23/2023   Lab Results  Component Value Date   CREATININE 1.23 (H) 09/23/2023   BUN 16 09/23/2023   NA 139 09/23/2023   K 4.1 09/23/2023   CL 111 09/23/2023   CO2 22 09/23/2023   Lab Results  Component Value Date   CHOL 204 (H) 09/22/2023   HDL 65 09/22/2023   LDLCALC 117 (H) 09/22/2023   TRIG 112 09/22/2023   CHOLHDL 3.1 09/22/2023     Lab Results  Component Value Date   HGBA1C 5.7 (H) 09/22/2023   Assessment & Plan    1.  NSTEMI/Hx of Takotsubo cardiomyopathy: -Presented with complaint of chest pressure on 09/20/2022 with elevation to HsTn 2213>1432 and LHC performed showing no angiographic evidence of CAD -2D echo completed with normal EF and no apical hypokinesis but basal hypokinesis -Cardiac MRI completed showing no evidence of myocarditis with normal ventricular heart pump function -Continue current medications:Norvasc 10mg  daily, Lasix, Metoprolol 200mg  daily, Crestor 40mg  daily, and Valsartan. -Plan to attend cardiac rehab to improve stamina and endurance.  2.  Essential hypertension: -Patient's blood pressure today was 118/62 -Continue Norvasc 10 mg, Toprol-XL 200 mg, Diovan 320 mg -Patient will log BP for 2 weeks and if controlled may reduce Norvasc to 5 mg with eventual discontinuation  3.  Hyperlipidemia: -Most recent LDL was 117 with LP(a) 320 -Crestor was increased to 40 mg -LFTs and lipids to be ordered in 6 weeks - Currently on Crestor 40mg  daily. -Refer to lipid clinic for further management. -Recheck cholesterol in 6 weeks.  4.  CKD stage IIIa: -Most recent creatinine was 1.2 -Continue current treatment plan per PCP  5.  Hypothyroidism: -TSH and T4 were elevated with recommendation to follow-up with PCP and continue current Synthroid dose.    Cardiac Rehabilitation Eligibility Assessment  The patient is ready to start cardiac rehabilitation from a cardiac standpoint.     Disposition: Follow-up with Christell Constant, MD or APP in 3 months    Signed, Napoleon Form, Leodis Rains, NP 10/05/2023, 6:17 PM [facility]

## 2023-10-05 ENCOUNTER — Encounter: Payer: Self-pay | Admitting: Nurse Practitioner

## 2023-10-05 ENCOUNTER — Ambulatory Visit: Payer: 59 | Attending: Nurse Practitioner | Admitting: Nurse Practitioner

## 2023-10-05 VITALS — BP 118/62 | HR 64 | Ht 64.0 in | Wt 275.0 lb

## 2023-10-05 DIAGNOSIS — N183 Chronic kidney disease, stage 3 unspecified: Secondary | ICD-10-CM

## 2023-10-05 DIAGNOSIS — I214 Non-ST elevation (NSTEMI) myocardial infarction: Secondary | ICD-10-CM

## 2023-10-05 DIAGNOSIS — E039 Hypothyroidism, unspecified: Secondary | ICD-10-CM

## 2023-10-05 DIAGNOSIS — I10 Essential (primary) hypertension: Secondary | ICD-10-CM

## 2023-10-05 DIAGNOSIS — E7841 Elevated Lipoprotein(a): Secondary | ICD-10-CM

## 2023-10-05 DIAGNOSIS — E785 Hyperlipidemia, unspecified: Secondary | ICD-10-CM | POA: Diagnosis not present

## 2023-10-05 NOTE — Patient Instructions (Addendum)
Medication Instructions:  Your physician recommends that you continue on your current medications as directed. Please refer to the Current Medication list given to you today.  *If you need a refill on your cardiac medications before your next appointment, please call your pharmacy*   Lab Work: NONE If you have labs (blood work) drawn today and your tests are completely normal, you will receive your results only by: MyChart Message (if you have MyChart) OR A paper copy in the mail If you have any lab test that is abnormal or we need to change your treatment, we will call you to review the results.   Testing/Procedures: Your Provider has referred you to the Pharmacy- -  Lipid Clinic.  Please monitor your Blood Pressure for 2 weeks.  Please call in readings or send in through my chart.    Follow-Up: At [facility], you and your health needs are our priority.  As part of our continuing mission to provide you with exceptional heart care, we have created designated Provider Care Teams.  These Care Teams include your primary Cardiologist (physician) and Advanced Practice Providers (APPs -  Physician Assistants and Nurse Practitioners) who all work together to provide you with the care you need, when you need it.   Your next appointment:   3 month(s)  Provider:   Christell Constant, MD  or Robin Searing, NP       Other Instructions Dr. Langston Reusing - Dermatologist

## 2023-10-13 ENCOUNTER — Encounter (HOSPITAL_COMMUNITY): Payer: Self-pay

## 2023-10-13 ENCOUNTER — Telehealth: Payer: Self-pay | Admitting: Nurse Practitioner

## 2023-10-13 NOTE — Telephone Encounter (Signed)
Left voicemail to return call to office

## 2023-10-13 NOTE — Telephone Encounter (Signed)
   Pt c/o of Chest Pain: STAT if active CP, including tightness, pressure, jaw pain, radiating pain to shoulder/upper arm/back, CP unrelieved by Nitro. Symptoms reported of SOB, nausea, vomiting, sweating.  1. Are you having CP right now? Not at this exact minute    2. Are you experiencing any other symptoms (ex. SOB, nausea, vomiting, sweating)? Shortness of breath   3. Is your CP continuous or coming and going? Comes and goes   4. Have you taken Nitroglycerin? No, took an Aspirin   5. How long have you been experiencing CP?   today   6. If NO CP at time of call then end call with telling Pt to call back or call 911 if Chest pain returns prior to return call from triage team.- Patient would like to be seen today

## 2023-10-13 NOTE — Telephone Encounter (Addendum)
Patient called stating she was having chest pain and needed to be seen today. Did inform patient we do not havre appointment for today. She stated she will go to ED. She only took aspirin this morning.  Advised patient to take her nitroglycerin wait 5 mins to see if it helps if it doesn't she can take another one and I will call her back in 10 mins.  Returned call to patient and the nitroglycerin helped. She only needed to take one. Chest pain relieved. She is currently at work.   ED precautions discussed. Will forward to provider.

## 2023-10-21 ENCOUNTER — Encounter (HOSPITAL_COMMUNITY)
Admission: RE | Admit: 2023-10-21 | Discharge: 2023-10-21 | Disposition: A | Discharge status: Home / Self Care | Payer: 59 | Source: Ambulatory Visit | Attending: Cardiology | Admitting: Cardiology

## 2023-10-21 VITALS — Ht 61.0 in | Wt 285.5 lb

## 2023-10-21 DIAGNOSIS — I214 Non-ST elevation (NSTEMI) myocardial infarction: Secondary | ICD-10-CM | POA: Diagnosis present

## 2023-10-21 NOTE — Patient Instructions (Signed)
 Patient Instructions  Patient Details  Name: Bonnie Martin MRN: [id number] Date of Birth: 1961-08-12 Referring Provider:  Vicci Rollo SAUNDERS, PA*  Below are your personal goals for exercise, nutrition, and risk factors. Our goal is to help you stay on track towards obtaining and maintaining these goals. We will be discussing your progress on these goals with you throughout the program.  Initial Exercise Prescription:  Initial Exercise Prescription - 10/21/23 1500       Date of Initial Exercise RX and Referring Provider   Date 10/21/23    Referring Provider Kate Bruckner MD      Oxygen   Maintain Oxygen Saturation 88% or higher      Treadmill   MPH 1.2    Grade 0    Minutes 15    METs 1.92      NuStep   Level 2    SPM 80    Minutes 15    METs 2      Prescription Details   Frequency (times per week) 2    Duration Progress to 30 minutes of continuous aerobic without signs/symptoms of physical distress      Intensity   THRR 40-80% of Max Heartrate 107-142    Ratings of Perceived Exertion 11-13    Perceived Dyspnea 0-4      Progression   Progression Continue to progress workloads to maintain intensity without signs/symptoms of physical distress.      Resistance Training   Training Prescription Yes    Weight 3 lb    Reps 10-15             Exercise Goals: Frequency: Be able to perform aerobic exercise two to three times per week in program working toward 2-5 days per week of home exercise.  Intensity: Work with a perceived exertion of 11 (fairly light) - 15 (hard) while following your exercise prescription.  We will make changes to your prescription with you as you progress through the program.   Duration: Be able to do 30 to 45 minutes of continuous aerobic exercise in addition to a 5 minute warm-up and a 5 minute cool-down routine.   Nutrition Goals: Your personal nutrition goals will be established when you do your nutrition analysis with the  dietician.  The following are general nutrition guidelines to follow: Cholesterol < 200mg /day Sodium < 1500mg /day Fiber: Women over 50 yrs - 21 grams per day  Personal Goals:  Personal Goals and Risk Factors at Admission - 10/21/23 1521       Core Components/Risk Factors/Patient Goals on Admission    Weight Management Yes;Obesity;Weight Loss    Intervention Weight Management: Develop a combined nutrition and exercise program designed to reach desired caloric intake, while maintaining appropriate intake of nutrient and fiber, sodium and fats, and appropriate energy expenditure required for the weight goal.;Weight Management/Obesity: Establish reasonable short term and long term weight goals.;Weight Management: Provide education and appropriate resources to help participant work on and attain dietary goals.;Obesity: Provide education and appropriate resources to help participant work on and attain dietary goals.    Admit Weight 285 lb 8 oz (129.5 kg)    Goal Weight: Short Term 280 lb (127 kg)    Goal Weight: Long Term 275 lb (124.7 kg)    Expected Outcomes Short Term: Continue to assess and modify interventions until short term weight is achieved;Long Term: Adherence to nutrition and physical activity/exercise program aimed toward attainment of established weight goal;Weight Loss: Understanding of general recommendations for a balanced  deficit meal plan, which promotes 1-2 lb weight loss per week and includes a negative energy balance of [phone] kcal/d;Understanding recommendations for meals to include 15-35% energy as protein, 25-35% energy from fat, 35-60% energy from carbohydrates, less than 200mg  of dietary cholesterol, 20-35 gm of total fiber daily;Understanding of distribution of calorie intake throughout the day with the consumption of 4-5 meals/snacks    Hypertension Yes    Intervention Provide education on lifestyle modifcations including regular physical activity/exercise, weight  management, moderate sodium restriction and increased consumption of fresh fruit, vegetables, and low fat dairy, alcohol moderation, and smoking cessation.;Monitor prescription use compliance.    Expected Outcomes Short Term: Continued assessment and intervention until BP is < 140/51mm HG in hypertensive participants. < 130/11mm HG in hypertensive participants with diabetes, heart failure or chronic kidney disease.;Long Term: Maintenance of blood pressure at goal levels.    Lipids Yes    Intervention Provide education and support for participant on nutrition & aerobic/resistive exercise along with prescribed medications to achieve LDL 70mg , HDL >40mg .    Expected Outcomes Short Term: Participant states understanding of desired cholesterol values and is compliant with medications prescribed. Participant is following exercise prescription and nutrition guidelines.;Long Term: Cholesterol controlled with medications as prescribed, with individualized exercise RX and with personalized nutrition plan. Value goals: LDL < 70mg , HDL > 40 mg.             Tobacco Use Initial Evaluation: Social History   Tobacco Use  Smoking Status Never  Smokeless Tobacco Never    Exercise Goals and Review:  Exercise Goals     Row Name 10/21/23 1602             Exercise Goals   Increase Physical Activity Yes       Intervention Provide advice, education, support and counseling about physical activity/exercise needs.;Develop an individualized exercise prescription for aerobic and resistive training based on initial evaluation findings, risk stratification, comorbidities and participant's personal goals.       Expected Outcomes Short Term: Attend rehab on a regular basis to increase amount of physical activity.;Long Term: Add in home exercise to make exercise part of routine and to increase amount of physical activity.;Long Term: Exercising regularly at least 3-5 days a week.       Increase Strength and Stamina  Yes       Intervention Provide advice, education, support and counseling about physical activity/exercise needs.;Develop an individualized exercise prescription for aerobic and resistive training based on initial evaluation findings, risk stratification, comorbidities and participant's personal goals.       Expected Outcomes Short Term: Increase workloads from initial exercise prescription for resistance, speed, and METs.;Short Term: Perform resistance training exercises routinely during rehab and add in resistance training at home;Long Term: Improve cardiorespiratory fitness, muscular endurance and strength as measured by increased METs and functional capacity ( )       Able to understand and use rate of perceived exertion (RPE) scale Yes       Intervention Provide education and explanation on how to use RPE scale       Expected Outcomes Short Term: Able to use RPE daily in rehab to express subjective intensity level;Long Term:  Able to use RPE to guide intensity level when exercising independently       Able to understand and use Dyspnea scale Yes       Intervention Provide education and explanation on how to use Dyspnea scale       Expected Outcomes Short Term:  Able to use Dyspnea scale daily in rehab to express subjective sense of shortness of breath during exertion;Long Term: Able to use Dyspnea scale to guide intensity level when exercising independently       Knowledge and understanding of Target Heart Rate Range (THRR) Yes       Intervention Provide education and explanation of THRR including how the numbers were predicted and where they are located for reference       Expected Outcomes Short Term: Able to state/look up THRR;Long Term: Able to use THRR to govern intensity when exercising independently;Short Term: Able to use daily as guideline for intensity in rehab       Able to check pulse independently Yes       Intervention Provide education and demonstration on how to check pulse in  carotid and radial arteries.;Review the importance of being able to check your own pulse for safety during independent exercise       Expected Outcomes Short Term: Able to explain why pulse checking is important during independent exercise;Long Term: Able to check pulse independently and accurately       Understanding of Exercise Prescription Yes       Intervention Provide education, explanation, and written materials on patient's individual exercise prescription       Expected Outcomes Short Term: Able to explain program exercise prescription;Long Term: Able to explain home exercise prescription to exercise independently                Copy of goals given to participant.

## 2023-10-21 NOTE — Progress Notes (Addendum)
 Cardiac Individual Treatment Plan  Patient Details  Name: Bonnie Martin MRN: [id number] Date of Birth: 07/05/1961 Referring Provider:   Flowsheet Row CARDIAC REHAB PHASE II ORIENTATION from 10/21/2023 in [facility] CARDIAC REHABILITATION  Referring Provider Kate Bruckner MD       Initial Encounter Date:  Flowsheet Row CARDIAC REHAB PHASE II ORIENTATION from 10/21/2023 in [place] [facility] CARDIAC REHABILITATION  Date 10/21/23       Visit Diagnosis: NSTEMI (non-ST elevated myocardial infarction) [facility])  Patient's Home Medications on Admission:  Current Outpatient Medications:    Acetaminophen  Extra Strength 500 MG TABS, Take 500-1,000 mg by mouth 3 (three) times daily as needed (Pain)., Disp: , Rfl:    allopurinol  (ZYLOPRIM ) 300 MG tablet, Take 300 mg by mouth every evening., Disp: , Rfl:    amLODipine  (NORVASC ) 10 MG tablet, Take 10 mg by mouth daily., Disp: , Rfl:    aspirin  EC 81 MG tablet, Take 1 tablet (81 mg total) by mouth daily. Swallow whole., Disp: , Rfl:    colchicine 0.6 MG tablet, Take 0.6 mg by mouth daily as needed (Gout)., Disp: , Rfl:    diclofenac  (VOLTAREN) 50 MG EC tablet, Take 50 mg by mouth 2 (two) times daily as needed for moderate pain (pain score 4-6)., Disp: , Rfl:    fluticasone (FLONASE) 50 MCG/ACT nasal spray, Place 1 spray into both nostrils daily as needed for allergies or rhinitis., Disp: , Rfl:    furosemide  (LASIX ) 20 MG tablet, Take 20 mg by mouth 2 (two) times daily., Disp: , Rfl:    halobetasol (ULTRAVATE) 0.05 % cream, Apply 1 Application topically daily., Disp: , Rfl:    hydrocortisone (ANUSOL-HC) 2.5 % rectal cream, Place 1 Application rectally 2 (two) times daily as needed for hemorrhoids or anal itching., Disp: , Rfl:    levothyroxine  (SYNTHROID ) 112 MCG tablet, Take by mouth., Disp: , Rfl:    methocarbamol  (ROBAXIN ) 500 MG tablet, Take 1 tablet (500 mg total) by mouth 3 (three) times daily. Medication may cause drowsiness (Patient taking  differently: Take 500 mg by mouth 3 (three) times daily as needed for muscle spasms.), Disp: 21 tablet, Rfl: 0   metoprolol  (TOPROL -XL) 200 MG 24 hr tablet, Take 200 mg by mouth daily., Disp: , Rfl:    nitroGLYCERIN  (NITROSTAT ) 0.4 MG SL tablet, Place 1 tablet (0.4 mg total) under the tongue every 5 (five) minutes x 3 doses as needed for chest pain., Disp: 25 tablet, Rfl: 1   omeprazole  (PRILOSEC) 40 MG capsule, Take 40 mg by mouth daily as needed (Indigestion)., Disp: , Rfl:    rosuvastatin  (CRESTOR ) 40 MG tablet, Take 1 tablet (40 mg total) by mouth daily., Disp: 90 tablet, Rfl: 3   valsartan (DIOVAN) 320 MG tablet, Take 320 mg by mouth daily., Disp: , Rfl:   Past Medical History: Past Medical History:  Diagnosis Date   Hypertension    Thyroid disease     Tobacco Use: Social History   Tobacco Use  Smoking Status Never  Smokeless Tobacco Never    Labs: Review Flowsheet       Latest Ref Rng & Units 09/22/2023  Labs for ITP Cardiac and Pulmonary Rehab  Cholestrol 0 - 200 mg/dL 795   LDL (calc) 0 - 99 mg/dL 882   HDL-C >59 mg/dL 65   Trlycerides <849 mg/dL 887   Hemoglobin J8r 4.8 - 5.6 % 5.7     Capillary Blood Glucose: No results found for: GLUCAP   Exercise Target Goals:  Exercise Program Goal: Individual exercise prescription set using results from initial 6 min walk test and THRR while considering  patient's activity barriers and safety.   Exercise Prescription Goal: Starting with aerobic activity 30 plus minutes a day, 3 days per week for initial exercise prescription. Provide home exercise prescription and guidelines that participant acknowledges understanding prior to discharge.  Activity Barriers & Risk Stratification:  Activity Barriers & Cardiac Risk Stratification - 10/21/23 1442       Activity Barriers & Cardiac Risk Stratification   Activity Barriers Arthritis    Cardiac Risk Stratification Moderate             6 Minute Walk:  6 Minute Walk      Row Name 10/21/23 1556         6 Minute Walk   Phase Initial     Distance 600 feet     Walk Time 4.77 minutes     # of Rest Breaks 1  1:14     MPH 1.43     METS 1.04     RPE 13     Perceived Dyspnea  1     VO2 Peak 3.67     Symptoms Yes (comment)     Comments R knee pain 5/10     Resting HR 72 bpm     Resting BP 100/60     Resting Oxygen Saturation  100 %     Exercise Oxygen Saturation  during 6 min walk 97 %     Max Ex. HR 119 bpm     Max Ex. BP 134/72     2 Minute Post BP 120/60              Oxygen Initial Assessment:   Oxygen Re-Evaluation:   Oxygen Discharge (Final Oxygen Re-Evaluation):   Initial Exercise Prescription:  Initial Exercise Prescription - 10/21/23 1500       Date of Initial Exercise RX and Referring Provider   Date 10/21/23    Referring Provider Kate Bruckner MD      Oxygen   Maintain Oxygen Saturation 88% or higher      Treadmill   MPH 1.2    Grade 0    Minutes 15    METs 1.92      NuStep   Level 2    SPM 80    Minutes 15    METs 2      Prescription Details   Frequency (times per week) 2    Duration Progress to 30 minutes of continuous aerobic without signs/symptoms of physical distress      Intensity   THRR 40-80% of Max Heartrate 107-142    Ratings of Perceived Exertion 11-13    Perceived Dyspnea 0-4      Progression   Progression Continue to progress workloads to maintain intensity without signs/symptoms of physical distress.      Resistance Training   Training Prescription Yes    Weight 3 lb    Reps 10-15             Perform Capillary Blood Glucose checks as needed.  Exercise Prescription Changes:   Exercise Prescription Changes     Row Name 10/21/23 1500             Response to Exercise   Blood Pressure (Admit) 100/60       Blood Pressure (Exercise) 134/72       Blood Pressure (Exit) 120/60       Heart Rate (Admit)  72 bpm       Heart Rate (Exercise) 119 bpm       Heart Rate  (Exit) 96 bpm       Oxygen Saturation (Admit) 100 %       Oxygen Saturation (Exercise) 97 %       Rating of Perceived Exertion (Exercise) 13       Perceived Dyspnea (Exercise) 1       Symptoms R knee pain 5/10       Comments walk test results                Exercise Comments:   Exercise Goals and Review:   Exercise Goals     Row Name 10/21/23 1602             Exercise Goals   Increase Physical Activity Yes       Intervention Provide advice, education, support and counseling about physical activity/exercise needs.;Develop an individualized exercise prescription for aerobic and resistive training based on initial evaluation findings, risk stratification, comorbidities and participant's personal goals.       Expected Outcomes Short Term: Attend rehab on a regular basis to increase amount of physical activity.;Long Term: Add in home exercise to make exercise part of routine and to increase amount of physical activity.;Long Term: Exercising regularly at least 3-5 days a week.       Increase Strength and Stamina Yes       Intervention Provide advice, education, support and counseling about physical activity/exercise needs.;Develop an individualized exercise prescription for aerobic and resistive training based on initial evaluation findings, risk stratification, comorbidities and participant's personal goals.       Expected Outcomes Short Term: Increase workloads from initial exercise prescription for resistance, speed, and METs.;Short Term: Perform resistance training exercises routinely during rehab and add in resistance training at home;Long Term: Improve cardiorespiratory fitness, muscular endurance and strength as measured by increased METs and functional capacity ( )       Able to understand and use rate of perceived exertion (RPE) scale Yes       Intervention Provide education and explanation on how to use RPE scale       Expected Outcomes Short Term: Able to use RPE daily in  rehab to express subjective intensity level;Long Term:  Able to use RPE to guide intensity level when exercising independently       Able to understand and use Dyspnea scale Yes       Intervention Provide education and explanation on how to use Dyspnea scale       Expected Outcomes Short Term: Able to use Dyspnea scale daily in rehab to express subjective sense of shortness of breath during exertion;Long Term: Able to use Dyspnea scale to guide intensity level when exercising independently       Knowledge and understanding of Target Heart Rate Range (THRR) Yes       Intervention Provide education and explanation of THRR including how the numbers were predicted and where they are located for reference       Expected Outcomes Short Term: Able to state/look up THRR;Long Term: Able to use THRR to govern intensity when exercising independently;Short Term: Able to use daily as guideline for intensity in rehab       Able to check pulse independently Yes       Intervention Provide education and demonstration on how to check pulse in carotid and radial arteries.;Review the importance of being able to check your own pulse for  safety during independent exercise       Expected Outcomes Short Term: Able to explain why pulse checking is important during independent exercise;Long Term: Able to check pulse independently and accurately       Understanding of Exercise Prescription Yes       Intervention Provide education, explanation, and written materials on patient's individual exercise prescription       Expected Outcomes Short Term: Able to explain program exercise prescription;Long Term: Able to explain home exercise prescription to exercise independently                Exercise Goals Re-Evaluation :    Discharge Exercise Prescription (Final Exercise Prescription Changes):  Exercise Prescription Changes - 10/21/23 1500       Response to Exercise   Blood Pressure (Admit) 100/60    Blood Pressure  (Exercise) 134/72    Blood Pressure (Exit) 120/60    Heart Rate (Admit) 72 bpm    Heart Rate (Exercise) 119 bpm    Heart Rate (Exit) 96 bpm    Oxygen Saturation (Admit) 100 %    Oxygen Saturation (Exercise) 97 %    Rating of Perceived Exertion (Exercise) 13    Perceived Dyspnea (Exercise) 1    Symptoms R knee pain 5/10    Comments walk test results             Nutrition:  Target Goals: Understanding of nutrition guidelines, daily intake of sodium 1500mg , cholesterol 200mg , calories 30% from fat and 7% or less from saturated fats, daily to have 5 or more servings of fruits and vegetables.  Biometrics:  Pre Biometrics - 10/21/23 1603       Pre Biometrics   Height 5' 1 (1.549 m)    Weight 129.5 kg    Waist Circumference 43.5 inches    Hip Circumference 60 inches    Waist to Hip Ratio 0.73 %    BMI (Calculated) 53.97    Grip Strength 27.4 kg    Single Leg Stand 4.5 seconds              Nutrition Therapy Plan and Nutrition Goals:   Nutrition Assessments:  MEDIFICTS Score Key: >=70 Need to make dietary changes  40-70 Heart Healthy Diet <= 40 Therapeutic Level Cholesterol Diet   Picture Your Plate Scores: <59 Unhealthy dietary pattern with much room for improvement. 41-50 Dietary pattern unlikely to meet recommendations for good health and room for improvement. 51-60 More healthful dietary pattern, with some room for improvement.  >60 Healthy dietary pattern, although there may be some specific behaviors that could be improved.    Nutrition Goals Re-Evaluation:   Nutrition Goals Discharge (Final Nutrition Goals Re-Evaluation):   Psychosocial: Target Goals: Acknowledge presence or absence of significant depression and/or stress, maximize coping skills, provide positive support system. Participant is able to verbalize types and ability to use techniques and skills needed for reducing stress and depression.  Initial Review & Psychosocial Screening:   Initial Psych Review & Screening - 10/21/23 1521       Initial Review   Current issues with None Identified      Family Dynamics   Good Support System? Yes      Screening Interventions   Interventions Encouraged to exercise;To provide support and resources with identified psychosocial needs;Provide feedback about the scores to participant    Expected Outcomes Short Term goal: Utilizing psychosocial counselor, staff and physician to assist with identification of specific Stressors or current issues interfering with healing process.  Setting desired goal for each stressor or current issue identified.;Long Term Goal: Stressors or current issues are controlled or eliminated.;Short Term goal: Identification and review with participant of any Quality of Life or Depression concerns found by scoring the questionnaire.;Long Term goal: The participant improves quality of Life and PHQ9 Scores as seen by post scores and/or verbalization of changes             Quality of Life Scores:  Scores of 19 and below usually indicate a poorer quality of life in these areas.  A difference of  2-3 points is a clinically meaningful difference.  A difference of 2-3 points in the total score of the Quality of Life Index has been associated with significant improvement in overall quality of life, self-image, physical symptoms, and general health in studies assessing change in quality of life.  PHQ-9: Review Flowsheet       10/21/2023 11/07/2016 11/03/2016  Depression screen PHQ 2/9  Decreased Interest 0 0 0  Down, Depressed, Hopeless 0 0 0  PHQ - 2 Score 0 0 0  Altered sleeping 0 - -  Tired, decreased energy 1 - -  Change in appetite 1 - -  Feeling bad or failure about yourself  0 - -  Trouble concentrating 0 - -  Moving slowly or fidgety/restless 0 - -  Suicidal thoughts 0 - -  PHQ-9 Score 2 - -  Difficult doing work/chores Not difficult at all - -   Interpretation of Total Score  Total Score Depression  Severity:  1-4 = Minimal depression, 5-9 = Mild depression, 10-14 = Moderate depression, 15-19 = Moderately severe depression, 20-27 = Severe depression   Psychosocial Evaluation and Intervention:  Psychosocial Evaluation - 10/21/23 1522       Psychosocial Evaluation & Interventions   Interventions Stress management education;Relaxation education;Encouraged to exercise with the program and follow exercise prescription    Comments Patient was referred to CR with NSTEMI. She had no CAD but MI was thought to be caused by influenza type A. 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 Her initial PHQ-9 score was 2 due to lack of energy and poor appetite. She denies any depression, anxiety, or stressors. She says she sleeps well. She says she has great support from her husband, son, her daughter and sister and nephews. She seems very motivated to participate in the program. Her goals are to be as healthy as she can; to improve her strength and stamina; and to learn how to take care of herself better. She has no barriers identified to complete the program.    Expected Outcomes Short Term: patient will start the program and attend consistently. Long Term: Complete the program meeting her personal goals.    Continue Psychosocial Services  Follow up required by staff             Psychosocial Re-Evaluation:   Psychosocial Discharge (Final Psychosocial Re-Evaluation):   Vocational Rehabilitation: Provide vocational rehab assistance to qualifying candidates.   Vocational Rehab Evaluation & Intervention:  Vocational Rehab - 10/21/23 1521       Initial Vocational Rehab Evaluation & Intervention   Assessment shows need for Vocational Rehabilitation No      Vocational Rehab Re-Evaulation   Comments Patient  has returned to work as a runner, broadcasting/film/video.             Education: Education Goals: Education classes will be provided on a weekly basis, covering required  topics.  Participant will state understanding/return demonstration of topics presented.  Learning Barriers/Preferences:  Learning Barriers/Preferences - 10/21/23 1529       Learning Barriers/Preferences   Learning Barriers None    Learning Preferences Audio;Written Material;Skilled Demonstration             Education Topics: Hypertension, Hypertension Reduction -Define heart disease and high blood pressure. Discus how high blood pressure affects the body and ways to reduce high blood pressure.   Exercise and Your Heart -Discuss why it is important to exercise, the FITT principles of exercise, normal and abnormal responses to exercise, and how to exercise safely.   Angina -Discuss definition of angina, causes of angina, treatment of angina, and how to decrease risk of having angina.   Cardiac Medications -Review what the following cardiac medications are used for, how they affect the body, and side effects that may occur when taking the medications.  Medications include Aspirin , Beta blockers, calcium  channel blockers, ACE Inhibitors, angiotensin receptor blockers, diuretics, digoxin, and antihyperlipidemics.   Congestive Heart Failure -Discuss the definition of CHF, how to live with CHF, the signs and symptoms of CHF, and how keep track of weight and sodium intake.   Heart Disease and Intimacy -Discus the effect sexual activity has on the heart, how changes occur during intimacy as we age, and safety during sexual activity.   Smoking Cessation / COPD -Discuss different methods to quit smoking, the health benefits of quitting smoking, and the definition of COPD.   Nutrition I: Fats -Discuss the types of cholesterol, what cholesterol does to the heart, and how cholesterol levels can be controlled.   Nutrition II: Labels -Discuss the different components of food labels and how to read food label   Heart Parts/Heart Disease and PAD -Discuss the anatomy of the heart,  the pathway of blood circulation through the heart, and these are affected by heart disease.   Stress I: Signs and Symptoms -Discuss the causes of stress, how stress may lead to anxiety and depression, and ways to limit stress.   Stress II: Relaxation -Discuss different types of relaxation techniques to limit stress.   Warning Signs of Stroke / TIA -Discuss definition of a stroke, what the signs and symptoms are of a stroke, and how to identify when someone is having stroke.   Knowledge Questionnaire Score:   Core Components/Risk Factors/Patient Goals at Admission:  Personal Goals and Risk Factors at Admission - 10/21/23 1521       Core Components/Risk Factors/Patient Goals on Admission    Weight Management Yes;Obesity;Weight Loss    Intervention Weight Management: Develop a combined nutrition and exercise program designed to reach desired caloric intake, while maintaining appropriate intake of nutrient and fiber, sodium and fats, and appropriate energy expenditure required for the weight goal.;Weight Management/Obesity: Establish reasonable short term and long term weight goals.;Weight Management: Provide education and appropriate resources to help participant work on and attain dietary goals.;Obesity: Provide education and appropriate resources to help participant work on and attain dietary goals.    Admit Weight 285 lb 8 oz (129.5 kg)    Goal Weight: Short Term 280 lb (127 kg)    Goal Weight: Long Term 275 lb (124.7 kg)    Expected Outcomes Short Term: Continue to assess and modify interventions until short term weight is achieved;Long Term: Adherence to nutrition and physical activity/exercise program aimed toward attainment of established weight goal;Weight Loss: Understanding of general recommendations for a balanced deficit meal plan, which promotes 1-2 lb weight loss per  week and includes a negative energy balance of [phone] kcal/d;Understanding recommendations for meals to  include 15-35% energy as protein, 25-35% energy from fat, 35-60% energy from carbohydrates, less than 200mg  of dietary cholesterol, 20-35 gm of total fiber daily;Understanding of distribution of calorie intake throughout the day with the consumption of 4-5 meals/snacks    Hypertension Yes    Intervention Provide education on lifestyle modifcations including regular physical activity/exercise, weight management, moderate sodium restriction and increased consumption of fresh fruit, vegetables, and low fat dairy, alcohol moderation, and smoking cessation.;Monitor prescription use compliance.    Expected Outcomes Short Term: Continued assessment and intervention until BP is < 140/64mm HG in hypertensive participants. < 130/30mm HG in hypertensive participants with diabetes, heart failure or chronic kidney disease.;Long Term: Maintenance of blood pressure at goal levels.    Lipids Yes    Intervention Provide education and support for participant on nutrition & aerobic/resistive exercise along with prescribed medications to achieve LDL 70mg , HDL >40mg .    Expected Outcomes Short Term: Participant states understanding of desired cholesterol values and is compliant with medications prescribed. Participant is following exercise prescription and nutrition guidelines.;Long Term: Cholesterol controlled with medications as prescribed, with individualized exercise RX and with personalized nutrition plan. Value goals: LDL < 70mg , HDL > 40 mg.             Core Components/Risk Factors/Patient Goals Review:    Core Components/Risk Factors/Patient Goals at Discharge (Final Review):    ITP Comments:   Comments: Patient arrived for 1st visit/orientation/education at 1400. Patient was referred to CR by Dr. Kate due to NSTEMI. During orientation advised patient on arrival and appointment times what to wear, what to do before, during and after exercise. Reviewed attendance and class policy.  Pt is scheduled to  return Cardiac Rehab on 10/27/23 at 1500. Pt was advised to come to class 15 minutes before class starts.  Discussed RPE/Dpysnea scales. Patient participated in warm up stretches. Patient was able to complete 6 minute walk test.  Telemetry:NSR. Patient was measured for the equipment. Discussed equipment safety with patient. Took patient pre-anthropometric measurements. Patient finished visit at 1545.

## 2023-10-23 ENCOUNTER — Telehealth: Payer: Self-pay | Admitting: Internal Medicine

## 2023-10-23 NOTE — Telephone Encounter (Signed)
 Patient states she has forms from [employer] that she is completing for critical illness verification and there is a section for MD to sign off on based on diagnosis he gave her. She would like to have Dr. Santo assist with completing this paperwork if possible.

## 2023-10-27 ENCOUNTER — Encounter (HOSPITAL_COMMUNITY): Payer: 59

## 2023-10-29 ENCOUNTER — Encounter (HOSPITAL_COMMUNITY): Payer: 59

## 2023-10-30 DIAGNOSIS — Z0279 Encounter for issue of other medical certificate: Secondary | ICD-10-CM

## 2023-11-02 ENCOUNTER — Telehealth (HOSPITAL_COMMUNITY): Payer: Self-pay

## 2023-11-02 NOTE — Telephone Encounter (Signed)
Pt LM on Cardiac Rehab line. Returned pt's call, left LM, awaiting call back

## 2023-11-02 NOTE — Telephone Encounter (Signed)
Pt called and stated she would like to attend CR here in [place] due to her work schedule. Adv pt I will pass her information to our RN for review and call to schedule at a later date.

## 2023-11-03 ENCOUNTER — Encounter (HOSPITAL_COMMUNITY): Payer: 59

## 2023-11-03 ENCOUNTER — Telehealth (HOSPITAL_COMMUNITY): Payer: Self-pay | Admitting: *Deleted

## 2023-11-03 NOTE — Telephone Encounter (Signed)
Patient came to our office to sign release of information and pay the forms fee of $29.  [facility] [employer] critical illness form placed in [employer].

## 2023-11-03 NOTE — Telephone Encounter (Signed)
Called and left message as a follow up from our conversation from yesterday regarding option for class times for Cardiac rehab for [place], [facility] and [facility] and which would work best for her work schedule as she has a report time of 8:00 in [place] lives in [place]. Requested call back. Alanson Aly, BSN Cardiac and Emergency planning/management officer

## 2023-11-04 NOTE — Telephone Encounter (Signed)
Completed [employer] form has been fax to insurance and scanned to patient's chart.Billing and patient notified.

## 2023-11-05 ENCOUNTER — Encounter (HOSPITAL_COMMUNITY): Payer: 59

## 2023-11-10 ENCOUNTER — Encounter (HOSPITAL_COMMUNITY)
Admission: RE | Admit: 2023-11-10 | Discharge: 2023-11-10 | Disposition: A | Discharge status: Home / Self Care | Payer: 59 | Source: Ambulatory Visit | Attending: Internal Medicine | Admitting: Internal Medicine

## 2023-11-10 DIAGNOSIS — I214 Non-ST elevation (NSTEMI) myocardial infarction: Secondary | ICD-10-CM

## 2023-11-10 NOTE — Progress Notes (Signed)
 Daily Session Note  Patient Details  Name: Bonnie Martin MRN: [id number] Date of Birth: [birth year] Referring Provider:   Flowsheet Row CARDIAC REHAB PHASE II ORIENTATION from 10/21/2023 in [facility] CARDIAC REHABILITATION  Referring Provider Epifanio Lesches MD       Encounter Date: 11/10/2023  Check In:  Session Check In - 11/10/23 1456       Check-In   Supervising physician immediately available to respond to emergencies See telemetry face sheet for immediately available MD    Location [facility]    Staff Present Ross Ludwig, BS, Exercise Physiologist;Phyllis Billingsley, RN;Brittany Foley, BSN, RN    Virtual Visit No    Medication changes reported     No    Fall or balance concerns reported    No    Tobacco Cessation No Change    Warm-up and Cool-down Performed on first and last piece of equipment    Resistance Training Performed Yes    VAD Patient? No    PAD/SET Patient? No      Pain Assessment   Currently in Pain? No/denies    Multiple Pain Sites No             Capillary Blood Glucose: No results found for this or any previous visit (from the past 24 hours).    Social History   Tobacco Use  Smoking Status Never  Smokeless Tobacco Never    Goals Met:  Independence with exercise equipment Exercise tolerated well No report of concerns or symptoms today Strength training completed today  Goals Unmet:  Not Applicable  Comments: First full day of exercise!  Patient was oriented to gym and equipment including functions, settings, policies, and procedures.  Patient's individual exercise prescription and treatment plan were reviewed.  All starting workloads were established based on the results of the 6 minute walk test done at initial orientation visit.  The plan for exercise progression was also introduced and progression will be customized based on patient's performance and goals.

## 2023-11-11 ENCOUNTER — Encounter (HOSPITAL_COMMUNITY): Payer: Self-pay | Admitting: *Deleted

## 2023-11-11 DIAGNOSIS — I214 Non-ST elevation (NSTEMI) myocardial infarction: Secondary | ICD-10-CM

## 2023-11-11 NOTE — Progress Notes (Signed)
 Cardiac Individual Treatment Plan  Patient Details  Name: Bonnie Martin MRN: [id number] Date of Birth: 01-24-1961 Referring Provider:   Flowsheet Row CARDIAC REHAB PHASE II ORIENTATION from 10/21/2023 in [facility] CARDIAC REHABILITATION  Referring Provider Epifanio Lesches MD       Initial Encounter Date:  Flowsheet Row CARDIAC REHAB PHASE II ORIENTATION from 10/21/2023 in [place] CARDIAC REHABILITATION  Date 10/21/23       Visit Diagnosis: NSTEMI (non-ST elevated myocardial infarction) [facility])  Patient's Home Medications on Admission:  Current Outpatient Medications:    Acetaminophen Extra Strength 500 MG TABS, Take 500-1,000 mg by mouth 3 (three) times daily as needed (Pain)., Disp: , Rfl:    allopurinol (ZYLOPRIM) 300 MG tablet, Take 300 mg by mouth every evening., Disp: , Rfl:    amLODipine (NORVASC) 10 MG tablet, Take 10 mg by mouth daily., Disp: , Rfl:    aspirin EC 81 MG tablet, Take 1 tablet (81 mg total) by mouth daily. Swallow whole., Disp: , Rfl:    colchicine 0.6 MG tablet, Take 0.6 mg by mouth daily as needed (Gout)., Disp: , Rfl:    diclofenac (VOLTAREN) 50 MG EC tablet, Take 50 mg by mouth 2 (two) times daily as needed for moderate pain (pain score 4-6)., Disp: , Rfl:    fluticasone (FLONASE) 50 MCG/ACT nasal spray, Place 1 spray into both nostrils daily as needed for allergies or rhinitis., Disp: , Rfl:    furosemide (LASIX) 20 MG tablet, Take 20 mg by mouth 2 (two) times daily., Disp: , Rfl:    halobetasol (ULTRAVATE) 0.05 % cream, Apply 1 Application topically daily., Disp: , Rfl:    hydrocortisone (ANUSOL-HC) 2.5 % rectal cream, Place 1 Application rectally 2 (two) times daily as needed for hemorrhoids or anal itching., Disp: , Rfl:    levothyroxine (SYNTHROID) 112 MCG tablet, Take by mouth., Disp: , Rfl:    methocarbamol (ROBAXIN) 500 MG tablet, Take 1 tablet (500 mg total) by mouth 3 (three) times daily. Medication may cause drowsiness (Patient taking  differently: Take 500 mg by mouth 3 (three) times daily as needed for muscle spasms.), Disp: 21 tablet, Rfl: 0   metoprolol (TOPROL-XL) 200 MG 24 hr tablet, Take 200 mg by mouth daily., Disp: , Rfl:    nitroGLYCERIN (NITROSTAT) 0.4 MG SL tablet, Place 1 tablet (0.4 mg total) under the tongue every 5 (five) minutes x 3 doses as needed for chest pain., Disp: 25 tablet, Rfl: 1   omeprazole (PRILOSEC) 40 MG capsule, Take 40 mg by mouth daily as needed (Indigestion)., Disp: , Rfl:    rosuvastatin (CRESTOR) 40 MG tablet, Take 1 tablet (40 mg total) by mouth daily., Disp: 90 tablet, Rfl: 3   valsartan (DIOVAN) 320 MG tablet, Take 320 mg by mouth daily., Disp: , Rfl:   Past Medical History: Past Medical History:  Diagnosis Date   Hypertension    Thyroid disease     Tobacco Use: Social History   Tobacco Use  Smoking Status Never  Smokeless Tobacco Never    Labs: Review Flowsheet       Latest Ref Rng & Units 09/22/2023  Labs for ITP Cardiac and Pulmonary Rehab  Cholestrol 0 - 200 mg/dL 409   LDL (calc) 0 - 99 mg/dL 811   HDL-C >91 mg/dL 65   Trlycerides <478 mg/dL 295   Hemoglobin A2Z 4.8 - 5.6 % 5.7     Capillary Blood Glucose: No results found for: "GLUCAP"   Exercise Target Goals:  Exercise Program Goal: Individual exercise prescription set using results from initial 6 min walk test and THRR while considering  patient's activity barriers and safety.   Exercise Prescription Goal: Starting with aerobic activity 30 plus minutes a day, 3 days per week for initial exercise prescription. Provide home exercise prescription and guidelines that participant acknowledges understanding prior to discharge.  Activity Barriers & Risk Stratification:  Activity Barriers & Cardiac Risk Stratification - 10/21/23 1442       Activity Barriers & Cardiac Risk Stratification   Activity Barriers Arthritis    Cardiac Risk Stratification Moderate             6 Minute Walk:  6 Minute Walk      Row Name 10/21/23 1556         6 Minute Walk   Phase Initial     Distance 600 feet     Walk Time 4.77 minutes     # of Rest Breaks 1  1:14     MPH 1.43     METS 1.04     RPE 13     Perceived Dyspnea  1     VO2 Peak 3.67     Symptoms Yes (comment)     Comments R knee pain 5/10     Resting HR 72 bpm     Resting BP 100/60     Resting Oxygen Saturation  100 %     Exercise Oxygen Saturation  during 6 min walk 97 %     Max Ex. HR 119 bpm     Max Ex. BP 134/72     2 Minute Post BP 120/60              Oxygen Initial Assessment:   Oxygen Re-Evaluation:   Oxygen Discharge (Final Oxygen Re-Evaluation):   Initial Exercise Prescription:  Initial Exercise Prescription - 10/21/23 1500       Date of Initial Exercise RX and Referring Provider   Date 10/21/23    Referring Provider Epifanio Lesches MD      Oxygen   Maintain Oxygen Saturation 88% or higher      Treadmill   MPH 1.2    Grade 0    Minutes 15    METs 1.92      NuStep   Level 2    SPM 80    Minutes 15    METs 2      Prescription Details   Frequency (times per week) 2    Duration Progress to 30 minutes of continuous aerobic without signs/symptoms of physical distress      Intensity   THRR 40-80% of Max Heartrate 107-142    Ratings of Perceived Exertion 11-13    Perceived Dyspnea 0-4      Progression   Progression Continue to progress workloads to maintain intensity without signs/symptoms of physical distress.      Resistance Training   Training Prescription Yes    Weight 3 lb    Reps 10-15             Perform Capillary Blood Glucose checks as needed.  Exercise Prescription Changes:   Exercise Prescription Changes     Row Name 10/21/23 1500             Response to Exercise   Blood Pressure (Admit) 100/60       Blood Pressure (Exercise) 134/72       Blood Pressure (Exit) 120/60       Heart Rate (Admit)  72 bpm       Heart Rate (Exercise) 119 bpm       Heart Rate  (Exit) 96 bpm       Oxygen Saturation (Admit) 100 %       Oxygen Saturation (Exercise) 97 %       Rating of Perceived Exertion (Exercise) 13       Perceived Dyspnea (Exercise) 1       Symptoms R knee pain 5/10       Comments walk test results                Exercise Comments:   Exercise Comments     Row Name 11/10/23 1457           Exercise Comments First full day of exercise!  Patient was oriented to gym and equipment including functions, settings, policies, and procedures.  Patient's individual exercise prescription and treatment plan were reviewed.  All starting workloads were established based on the results of the 6 minute walk test done at initial orientation visit.  The plan for exercise progression was also introduced and progression will be customized based on patient's performance and goals.                Exercise Goals and Review:   Exercise Goals     Row Name 10/21/23 1602             Exercise Goals   Increase Physical Activity Yes       Intervention Provide advice, education, support and counseling about physical activity/exercise needs.;Develop an individualized exercise prescription for aerobic and resistive training based on initial evaluation findings, risk stratification, comorbidities and participant's personal goals.       Expected Outcomes Short Term: Attend rehab on a regular basis to increase amount of physical activity.;Long Term: Add in home exercise to make exercise part of routine and to increase amount of physical activity.;Long Term: Exercising regularly at least 3-5 days a week.       Increase Strength and Stamina Yes       Intervention Provide advice, education, support and counseling about physical activity/exercise needs.;Develop an individualized exercise prescription for aerobic and resistive training based on initial evaluation findings, risk stratification, comorbidities and participant's personal goals.       Expected Outcomes  Short Term: Increase workloads from initial exercise prescription for resistance, speed, and METs.;Short Term: Perform resistance training exercises routinely during rehab and add in resistance training at home;Long Term: Improve cardiorespiratory fitness, muscular endurance and strength as measured by increased METs and functional capacity ( )       Able to understand and use rate of perceived exertion (RPE) scale Yes       Intervention Provide education and explanation on how to use RPE scale       Expected Outcomes Short Term: Able to use RPE daily in rehab to express subjective intensity level;Long Term:  Able to use RPE to guide intensity level when exercising independently       Able to understand and use Dyspnea scale Yes       Intervention Provide education and explanation on how to use Dyspnea scale       Expected Outcomes Short Term: Able to use Dyspnea scale daily in rehab to express subjective sense of shortness of breath during exertion;Long Term: Able to use Dyspnea scale to guide intensity level when exercising independently       Knowledge and understanding of Target Heart Rate Range (THRR) Yes  Intervention Provide education and explanation of THRR including how the numbers were predicted and where they are located for reference       Expected Outcomes Short Term: Able to state/look up THRR;Long Term: Able to use THRR to govern intensity when exercising independently;Short Term: Able to use daily as guideline for intensity in rehab       Able to check pulse independently Yes       Intervention Provide education and demonstration on how to check pulse in carotid and radial arteries.;Review the importance of being able to check your own pulse for safety during independent exercise       Expected Outcomes Short Term: Able to explain why pulse checking is important during independent exercise;Long Term: Able to check pulse independently and accurately       Understanding of Exercise  Prescription Yes       Intervention Provide education, explanation, and written materials on patient's individual exercise prescription       Expected Outcomes Short Term: Able to explain program exercise prescription;Long Term: Able to explain home exercise prescription to exercise independently                Exercise Goals Re-Evaluation :  Exercise Goals Re-Evaluation     Row Name 11/10/23 1458             Exercise Goal Re-Evaluation   Exercise Goals Review Able to understand and use rate of perceived exertion (RPE) scale;Knowledge and understanding of Target Heart Rate Range (THRR);Able to understand and use Dyspnea scale;Understanding of Exercise Prescription       Comments Reviewed RPE and dyspnea scale, THR and program prescription with pt today.  Pt voiced understanding and was given a copy of goals to take home.       Expected Outcomes Short: Use RPE daily to regulate intensity.  Long: Follow program prescription in THR.                 Discharge Exercise Prescription (Final Exercise Prescription Changes):  Exercise Prescription Changes - 10/21/23 1500       Response to Exercise   Blood Pressure (Admit) 100/60    Blood Pressure (Exercise) 134/72    Blood Pressure (Exit) 120/60    Heart Rate (Admit) 72 bpm    Heart Rate (Exercise) 119 bpm    Heart Rate (Exit) 96 bpm    Oxygen Saturation (Admit) 100 %    Oxygen Saturation (Exercise) 97 %    Rating of Perceived Exertion (Exercise) 13    Perceived Dyspnea (Exercise) 1    Symptoms R knee pain 5/10    Comments walk test results             Nutrition:  Target Goals: Understanding of nutrition guidelines, daily intake of sodium 1500mg , cholesterol 200mg , calories 30% from fat and 7% or less from saturated fats, daily to have 5 or more servings of fruits and vegetables.  Biometrics:  Pre Biometrics - 10/21/23 1603       Pre Biometrics   Height 5\' 1"  (1.549 m)    Weight 285 lb 8 oz (129.5 kg)    Waist  Circumference 43.5 inches    Hip Circumference 60 inches    Waist to Hip Ratio 0.73 %    BMI (Calculated) 53.97    Grip Strength 27.4 kg    Single Leg Stand 4.5 seconds              Nutrition Therapy Plan and Nutrition Goals:  Nutrition Assessments:  MEDIFICTS Score Key: >=70 Need to make dietary changes  40-70 Heart Healthy Diet <= 40 Therapeutic Level Cholesterol Diet  Flowsheet Row CARDIAC REHAB PHASE II ORIENTATION from 10/21/2023 in [facility] CARDIAC REHABILITATION  Picture Your Plate Total Score on Admission 62      Picture Your Plate Scores: <16 Unhealthy dietary pattern with much room for improvement. 41-50 Dietary pattern unlikely to meet recommendations for good health and room for improvement. 51-60 More healthful dietary pattern, with some room for improvement.  >60 Healthy dietary pattern, although there may be some specific behaviors that could be improved.    Nutrition Goals Re-Evaluation:   Nutrition Goals Discharge (Final Nutrition Goals Re-Evaluation):   Psychosocial: Target Goals: Acknowledge presence or absence of significant depression and/or stress, maximize coping skills, provide positive support system. Participant is able to verbalize types and ability to use techniques and skills needed for reducing stress and depression.  Initial Review & Psychosocial Screening:  Initial Psych Review & Screening - 10/21/23 1521       Initial Review   Current issues with None Identified      Family Dynamics   Good Support System? Yes      Screening Interventions   Interventions Encouraged to exercise;To provide support and resources with identified psychosocial needs;Provide feedback about the scores to participant    Expected Outcomes Short Term goal: Utilizing psychosocial counselor, staff and physician to assist with identification of specific Stressors or current issues interfering with healing process. Setting desired goal for each stressor or  current issue identified.;Long Term Goal: Stressors or current issues are controlled or eliminated.;Short Term goal: Identification and review with participant of any Quality of Life or Depression concerns found by scoring the questionnaire.;Long Term goal: The participant improves quality of Life and PHQ9 Scores as seen by post scores and/or verbalization of changes             Quality of Life Scores:  Quality of Life - 11/10/23 0928       Quality of Life   Select Quality of Life      Quality of Life Scores   Health/Function Pre 22.63 %    Socioeconomic Pre 23.06 %    Psych/Spiritual Pre 24.86 %    Family Pre 26.4 %    GLOBAL Pre 23.71 %            Scores of 19 and below usually indicate a poorer quality of life in these areas.  A difference of  2-3 points is a clinically meaningful difference.  A difference of 2-3 points in the total score of the Quality of Life Index has been associated with significant improvement in overall quality of life, self-image, physical symptoms, and general health in studies assessing change in quality of life.  PHQ-9: Review Flowsheet       10/21/2023 11/07/2016 11/03/2016  Depression screen PHQ 2/9  Decreased Interest 0 0 0  Down, Depressed, Hopeless 0 0 0  PHQ - 2 Score 0 0 0  Altered sleeping 0 - -  Tired, decreased energy 1 - -  Change in appetite 1 - -  Feeling bad or failure about yourself  0 - -  Trouble concentrating 0 - -  Moving slowly or fidgety/restless 0 - -  Suicidal thoughts 0 - -  PHQ-9 Score 2 - -  Difficult doing work/chores Not difficult at all - -   Interpretation of Total Score  Total Score Depression Severity:  1-4 = Minimal  depression, 5-9 = Mild depression, 10-14 = Moderate depression, 15-19 = Moderately severe depression, 20-27 = Severe depression   Psychosocial Evaluation and Intervention:  Psychosocial Evaluation - 10/21/23 1522       Psychosocial Evaluation & Interventions   Interventions Stress  management education;Relaxation education;Encouraged to exercise with the program and follow exercise prescription    Comments Patient was referred to CR with NSTEMI. She had no CAD but MI was thought to be caused by influenza type A. 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 Her initial PHQ-9 score was 2 due to lack of energy and poor appetite. She denies any depression, anxiety, or stressors. She says she sleeps well. She says she has great support from her husband, son, her daughter and sister and nephews. She seems very motivated to participate in the program. Her goals are to be as healthy as she can; to improve her strength and stamina; and to learn how to take care of herself better. She has no barriers identified to complete the program.    Expected Outcomes Short Term: patient will start the program and attend consistently. Long Term: Complete the program meeting her personal goals.    Continue Psychosocial Services  Follow up required by staff             Psychosocial Re-Evaluation:   Psychosocial Discharge (Final Psychosocial Re-Evaluation):   Vocational Rehabilitation: Provide vocational rehab assistance to qualifying candidates.   Vocational Rehab Evaluation & Intervention:  Vocational Rehab - 10/21/23 1521       Initial Vocational Rehab Evaluation & Intervention   Assessment shows need for Vocational Rehabilitation No      Vocational Rehab Re-Evaulation   Comments Patient  has returned to work as a Runner, broadcasting/film/video.             Education: Education Goals: Education classes will be provided on a weekly basis, covering required topics. Participant will state understanding/return demonstration of topics presented.  Learning Barriers/Preferences:  Learning Barriers/Preferences - 10/21/23 1529       Learning Barriers/Preferences   Learning Barriers None    Learning Preferences Audio;Written Material;Skilled Demonstration              Education Topics: Hypertension, Hypertension Reduction -Define heart disease and high blood pressure. Discus how high blood pressure affects the body and ways to reduce high blood pressure.   Exercise and Your Heart -Discuss why it is important to exercise, the FITT principles of exercise, normal and abnormal responses to exercise, and how to exercise safely.   Angina -Discuss definition of angina, causes of angina, treatment of angina, and how to decrease risk of having angina.   Cardiac Medications -Review what the following cardiac medications are used for, how they affect the body, and side effects that may occur when taking the medications.  Medications include Aspirin, Beta blockers, calcium channel blockers, ACE Inhibitors, angiotensin receptor blockers, diuretics, digoxin, and antihyperlipidemics.   Congestive Heart Failure -Discuss the definition of CHF, how to live with CHF, the signs and symptoms of CHF, and how keep track of weight and sodium intake.   Heart Disease and Intimacy -Discus the effect sexual activity has on the heart, how changes occur during intimacy as we age, and safety during sexual activity.   Smoking Cessation / COPD -Discuss different methods to quit smoking, the health benefits of quitting smoking, and the definition of COPD.   Nutrition I: Fats -Discuss the types of cholesterol, what cholesterol  does to the heart, and how cholesterol levels can be controlled.   Nutrition II: Labels -Discuss the different components of food labels and how to read food label   Heart Parts/Heart Disease and PAD -Discuss the anatomy of the heart, the pathway of blood circulation through the heart, and these are affected by heart disease.   Stress I: Signs and Symptoms -Discuss the causes of stress, how stress may lead to anxiety and depression, and ways to limit stress.   Stress II: Relaxation -Discuss different types of relaxation techniques to  limit stress.   Warning Signs of Stroke / TIA -Discuss definition of a stroke, what the signs and symptoms are of a stroke, and how to identify when someone is having stroke.   Knowledge Questionnaire Score:  Knowledge Questionnaire Score - 11/10/23 1610       Knowledge Questionnaire Score   Pre Score 25/26             Core Components/Risk Factors/Patient Goals at Admission:  Personal Goals and Risk Factors at Admission - 10/21/23 1521       Core Components/Risk Factors/Patient Goals on Admission    Weight Management Yes;Obesity;Weight Loss    Intervention Weight Management: Develop a combined nutrition and exercise program designed to reach desired caloric intake, while maintaining appropriate intake of nutrient and fiber, sodium and fats, and appropriate energy expenditure required for the weight goal.;Weight Management/Obesity: Establish reasonable short term and long term weight goals.;Weight Management: Provide education and appropriate resources to help participant work on and attain dietary goals.;Obesity: Provide education and appropriate resources to help participant work on and attain dietary goals.    Admit Weight 285 lb 8 oz (129.5 kg)    Goal Weight: Short Term 280 lb (127 kg)    Goal Weight: Long Term 275 lb (124.7 kg)    Expected Outcomes Short Term: Continue to assess and modify interventions until short term weight is achieved;Long Term: Adherence to nutrition and physical activity/exercise program aimed toward attainment of established weight goal;Weight Loss: Understanding of general recommendations for a balanced deficit meal plan, which promotes 1-2 lb weight loss per week and includes a negative energy balance of [phone] kcal/d;Understanding recommendations for meals to include 15-35% energy as protein, 25-35% energy from fat, 35-60% energy from carbohydrates, less than 200mg  of dietary cholesterol, 20-35 gm of total fiber daily;Understanding of distribution of  calorie intake throughout the day with the consumption of 4-5 meals/snacks    Hypertension Yes    Intervention Provide education on lifestyle modifcations including regular physical activity/exercise, weight management, moderate sodium restriction and increased consumption of fresh fruit, vegetables, and low fat dairy, alcohol moderation, and smoking cessation.;Monitor prescription use compliance.    Expected Outcomes Short Term: Continued assessment and intervention until BP is < 140/56mm HG in hypertensive participants. < 130/27mm HG in hypertensive participants with diabetes, heart failure or chronic kidney disease.;Long Term: Maintenance of blood pressure at goal levels.    Lipids Yes    Intervention Provide education and support for participant on nutrition & aerobic/resistive exercise along with prescribed medications to achieve LDL 70mg , HDL >40mg .    Expected Outcomes Short Term: Participant states understanding of desired cholesterol values and is compliant with medications prescribed. Participant is following exercise prescription and nutrition guidelines.;Long Term: Cholesterol controlled with medications as prescribed, with individualized exercise RX and with personalized nutrition plan. Value goals: LDL < 70mg , HDL > 40 mg.             Core Components/Risk  Factors/Patient Goals Review:    Core Components/Risk Factors/Patient Goals at Discharge (Final Review):    ITP Comments:  ITP Comments     Row Name 10/21/23 1609 11/10/23 1457 11/11/23 1422       ITP Comments Patient arrived for 1st visit/orientation/education at 1400. Patient was referred to CR by Dr. Bjorn Pippin due to NSTEMI. During orientation advised patient on arrival and appointment times what to wear, what to do before, during and after exercise. Reviewed attendance and class policy.  Pt is scheduled to return Cardiac Rehab on 10/27/23 at 1500. Pt was advised to come to class 15 minutes before class starts.  Discussed  RPE/Dpysnea scales. Patient participated in warm up stretches. Patient was able to complete 6 minute walk test.  Telemetry:NSR. Patient was measured for the equipment. Discussed equipment safety with patient. Took patient pre-anthropometric measurements. Patient finished visit at 1545. First full day of exercise!  Patient was oriented to gym and equipment including functions, settings, policies, and procedures.  Patient's individual exercise prescription and treatment plan were reviewed.  All starting workloads were established based on the results of the 6 minute walk test done at initial orientation visit.  The plan for exercise progression was also introduced and progression will be customized based on patient's performance and goals. 30 day review completed. ITP sent to Dr. Dina Rich, Medical Director of Cardiac Rehab. Continue with ITP unless changes are made by physician.  New to program              Comments: 30 day review

## 2023-11-12 ENCOUNTER — Encounter (HOSPITAL_COMMUNITY): Payer: 59

## 2023-11-17 ENCOUNTER — Encounter (HOSPITAL_COMMUNITY)
Admission: RE | Admit: 2023-11-17 | Discharge: 2023-11-17 | Disposition: A | Discharge status: Home / Self Care | Payer: 59 | Source: Ambulatory Visit | Attending: Internal Medicine | Admitting: Internal Medicine

## 2023-11-17 DIAGNOSIS — I214 Non-ST elevation (NSTEMI) myocardial infarction: Secondary | ICD-10-CM | POA: Diagnosis present

## 2023-11-17 NOTE — Progress Notes (Signed)
 Daily Session Note  Patient Details  Name: DANDREA MEDDERS MRN: [id number] Date of Birth: [birth year] Referring Provider:   Flowsheet Row CARDIAC REHAB PHASE II ORIENTATION from 10/21/2023 in [facility] CARDIAC REHABILITATION  Referring Provider Epifanio Lesches MD       Encounter Date: 11/17/2023  Check In:  Session Check In - 11/17/23 1501       Check-In   Supervising physician immediately available to respond to emergencies See telemetry face sheet for immediately available MD    Location [facility]    Staff Present Ross Ludwig, BS, Exercise Physiologist;Brittany Roseanne Reno, BSN, RN, WTA-C    Virtual Visit No    Medication changes reported     No    Fall or balance concerns reported    No    Warm-up and Cool-down Performed on first and last piece of equipment    Resistance Training Performed Yes    VAD Patient? No    PAD/SET Patient? No      Pain Assessment   Currently in Pain? No/denies             Capillary Blood Glucose: No results found for this or any previous visit (from the past 24 hours).    Social History   Tobacco Use  Smoking Status Never  Smokeless Tobacco Never    Goals Met:  Exercise tolerated well No report of concerns or symptoms today Strength training completed today  Goals Unmet:  Not Applicable  Comments: Pt able to follow exercise prescription today without complaint.  Will continue to monitor for progression.

## 2023-11-19 ENCOUNTER — Encounter (HOSPITAL_COMMUNITY): Payer: 59

## 2023-11-20 ENCOUNTER — Telehealth: Payer: Self-pay | Admitting: Pharmacy Technician

## 2023-11-20 ENCOUNTER — Telehealth: Payer: Self-pay | Admitting: Pharmacist

## 2023-11-20 ENCOUNTER — Ambulatory Visit: Payer: 59 | Attending: Internal Medicine | Admitting: Pharmacist

## 2023-11-20 ENCOUNTER — Encounter: Payer: Self-pay | Admitting: Pharmacist

## 2023-11-20 ENCOUNTER — Other Ambulatory Visit (HOSPITAL_COMMUNITY): Payer: Self-pay

## 2023-11-20 DIAGNOSIS — E782 Mixed hyperlipidemia: Secondary | ICD-10-CM

## 2023-11-20 MED ORDER — REPATHA SURECLICK 140 MG/ML ~~LOC~~ SOAJ: 140.0000 mg | SUBCUTANEOUS | 3 refills | Status: DC

## 2023-11-20 NOTE — Telephone Encounter (Signed)
PA approved see other encounter

## 2023-11-20 NOTE — Telephone Encounter (Signed)
 Call to inform, LVM.

## 2023-11-20 NOTE — Telephone Encounter (Signed)
 Pharmacy Patient Advocate Encounter   Received notification from Physician's Office that prior authorization for [facility] is required/requested.   Insurance verification completed.   The patient is insured through [facility] ADVANTAGE/RX ADVANCE .   Per test claim: The current 11/20/23 day co-pay is, $1284.44- one month.  No PA needed at this time. This test claim was processed through [facility]- copay amounts may vary at other pharmacies due to pharmacy/plan contracts, or as the patient moves through the different stages of their insurance plan.

## 2023-11-20 NOTE — Telephone Encounter (Signed)
 Pharmacy Patient Advocate Encounter   Received notification from Pt Calls Messages that prior authorization for Repatha is required/requested.   Insurance verification completed.   The patient is insured through [facility] ADVANTAGE/RX ADVANCE .   Per test claim: PA required; PA submitted to above mentioned insurance via CoverMyMeds Key/confirmation #/EOC [id number] Status is pending

## 2023-11-20 NOTE — Assessment & Plan Note (Signed)
 Assessment:  LDL goal: <55  mg/dl last LDLc 161 mg/dl (09/60) TG at goal 454  Risk factors -premature CAD ,hx of NSTEMI, elevated Lp(a)  Tolerates high intensity statins well without any side effects  Eats healthy home cooked meals mostly, unable to do exercise due to knee arthritis  Discussed next potential options (PCSK-9 inhibitors, bempedoic acid and inclisiran); cost, dosing efficacy, side effects   Plan: Continue taking current medications (Crestor 40 mg daily)  Will apply for PA for PCSK9i; will inform patient upon approval  Lipid lab due in 2-3 months after starting [facility]

## 2023-11-20 NOTE — Telephone Encounter (Signed)
 Pharmacy Patient Advocate Encounter   Received notification from Pt Calls Messages that prior authorization for Ozempic is required/requested.   Insurance verification completed.   The patient is insured through [facility] ADVANTAGE/RX ADVANCE .   Per test claim: PA required; PA submitted to above mentioned insurance via CoverMyMeds Key/confirmation #/EOC [id number] Status is pending

## 2023-11-20 NOTE — Progress Notes (Signed)
 Patient ID: [name]                 DOB: March 01, 1961                    MRN: [id number]      HPI: Bonnie Martin is a 63 y.o. female patient referred to lipid clinic by Bonnie Searing, NP. PMH is significant for CAD ,hx of NSTEMI, elevated Lp(a), GERD, hypertension, arthritis, elevated LFTs, HLD,obesity   Bonnie Martin was seen initially by Bonnie Martin in 2023 for complaint of chest pain.  She was previously diagnosed with Takotsubo cardiomyopathy in 2002.  She was admitted on 09/22/2023 with flulike symptoms and chest pressure.  She initially presented to [facility] and troponins were elevated at 2213 with chest x-ray showing no active cardiopulmonary disease.  EKG was completed showing no clear ischemic changes.  2D echo was completed showing EF of 60 to 65% with no RWMA and mild concentric LVH and no apical hypokinesis but basal hypokinesis with grade 1 DD and no significant valve abnormalities.  She was transferred to [facility] and underwent LHC by Bonnie Martin on 09/22/2023 that showed no obstructive disease.  She underwent a cardiac MRI that showed normal ventricular function and no evidence of myocarditis.  Patient saw NP post LHC. BP was controlled Norvasc 10mg  daily, Lasix, Metoprolol 200mg  daily, Crestor 40mg  daily, and Valsartan was continued. LDL-117 and Lp(a) 320. Patient was referred to lipid clinic to lower LDL at goal   Patient presented today for lipid clinic . Reports she tolerates Crestor 40 mg daily well. Reviewed options for lowering LDL cholesterol, including ezetimibe, PCSK-9 inhibitors, bempedoic acid and inclisiran.  Discussed mechanisms of action, dosing, side effects and potential decreases in LDL cholesterol.  Also reviewed cost information and potential options for patient assistance.   Given hx of NSTEMI and obesity we assessed coverage for GLP1 - Wegovy cost >1300 per month and Zepbound >1000/month discussed Zepbound Armed forces training and education officer. Will think about it  and let us know if she is interested in going on GLP1 weight loss treatment.    Current Medications: Crestor 40 mg daily  Intolerances: none  Risk Factors: premature CAD ,hx of NSTEMI, elevated Lp(a)  LDL goal: <55 mg/dl  Last lab Jan 3244: TC 204, LDL 117, HDL 65, TG 112   Diet: home cooked meals - watches salt and fat  Some times skips breakfast and eats only one meal per day   Exercise: none due to bad knee - waiting for knee replacement   Family History:  Relation Problem Comments  Mother (Deceased) Kidney disease     Father (Deceased) Heart disease, MI      Sister Breast cancer (Age: 74)      Labs:  Lipid Panel     Component Value Date/Time   CHOL 204 (H) 09/22/2023 0308   TRIG 112 09/22/2023 0308   HDL 65 09/22/2023 0308   CHOLHDL 3.1 09/22/2023 0308   VLDL 22 09/22/2023 0308   LDLCALC 117 (H) 09/22/2023 0308    Past Medical History:  Diagnosis Date   Hypertension    Thyroid disease     Current Outpatient Medications on File Prior to Visit  Medication Sig Dispense Refill   Acetaminophen Extra Strength 500 MG TABS Take 500-1,000 mg by mouth 3 (three) times daily as needed (Pain).     allopurinol (ZYLOPRIM) 300 MG tablet Take 300 mg by mouth every evening.     amLODipine (  NORVASC) 10 MG tablet Take 10 mg by mouth daily.     aspirin EC 81 MG tablet Take 1 tablet (81 mg total) by mouth daily. Swallow whole.     colchicine 0.6 MG tablet Take 0.6 mg by mouth daily as needed (Gout).     diclofenac (VOLTAREN) 50 MG EC tablet Take 50 mg by mouth 2 (two) times daily as needed for moderate pain (pain score 4-6).     fluticasone (FLONASE) 50 MCG/ACT nasal spray Place 1 spray into both nostrils daily as needed for allergies or rhinitis.     furosemide (LASIX) 20 MG tablet Take 20 mg by mouth 2 (two) times daily.     halobetasol (ULTRAVATE) 0.05 % cream Apply 1 Application topically daily.     hydrocortisone (ANUSOL-HC) 2.5 % rectal cream Place 1 Application rectally 2  (two) times daily as needed for hemorrhoids or anal itching.     levothyroxine (SYNTHROID) 112 MCG tablet Take by mouth.     methocarbamol (ROBAXIN) 500 MG tablet Take 1 tablet (500 mg total) by mouth 3 (three) times daily. Medication may cause drowsiness (Patient taking differently: Take 500 mg by mouth 3 (three) times daily as needed for muscle spasms.) 21 tablet 0   metoprolol (TOPROL-XL) 200 MG 24 hr tablet Take 200 mg by mouth daily.     nitroGLYCERIN (NITROSTAT) 0.4 MG SL tablet Place 1 tablet (0.4 mg total) under the tongue every 5 (five) minutes x 3 doses as needed for chest pain. 25 tablet 1   omeprazole (PRILOSEC) 40 MG capsule Take 40 mg by mouth daily as needed (Indigestion).     rosuvastatin (CRESTOR) 40 MG tablet Take 1 tablet (40 mg total) by mouth daily. 90 tablet 3   valsartan (DIOVAN) 320 MG tablet Take 320 mg by mouth daily.     No current facility-administered medications on file prior to visit.    Allergies  Allergen Reactions   Shellfish Allergy Anaphylaxis   Sulfonamide Derivatives     REACTION: Unknown reaction    Assessment/Plan:  1. Hyperlipidemia -  Problem  Mixed Hyperlipidemia  Morbid Obesity (Hcc)   Mixed hyperlipidemia Assessment:  LDL goal: <55  mg/dl last LDLc 604 mg/dl (54/09) TG at goal 811  Risk factors -premature CAD ,hx of NSTEMI, elevated Lp(a)  Tolerates high intensity statins well without any side effects  Eats healthy home cooked meals mostly, unable to do exercise due to knee arthritis  Discussed next potential options (PCSK-9 inhibitors, bempedoic acid and inclisiran); cost, dosing efficacy, side effects   Plan: Continue taking current medications (Crestor 40 mg daily)  Will apply for PA for PCSK9i; will inform patient upon approval  Lipid lab due in 2-3 months after starting PCSK9i   Morbid obesity (HCC) Given hx of NSTEMI and obesity  reasonable to consider GLP1 treatment to lower future risk of MI. We assessed coverage for GLP1 -  Wegovy cost >1300 per month and Zepbound >1000/month discussed Zepbound Armed forces training and education officer. Will think about it and let us know if she is interested in going on GLP1 weight loss treatment.     Thank you,  Carmela Hurt, Pharm.D [facility] [address], [place], [place] [address]  Phone: [phone]; Fax: [phone]

## 2023-11-20 NOTE — Patient Instructions (Signed)
 Your Results:             Your most recent labs Goal  Total Cholesterol 204 < 200  Triglycerides 112 < 150  HDL (happy/good cholesterol) 65 > 40  LDL (lousy/bad cholesterol 117 < 55   Medication changes:  We will start the process to get PCSK9i (Repatha or Praluent)  covered by your insurance.  Once the prior authorization is complete, we will call you to let you know and confirm pharmacy information.     Praluent is a cholesterol medication that improved your body's ability to get rid of "bad cholesterol" known as LDL. It can lower your LDL up to 60%. It is an injection that is given under the skin every 2 weeks. The most common side effects of Praluent include runny nose, symptoms of the common cold, rarely flu or flu-like symptoms, back/muscle pain in about 3-4% of the patients, and redness, pain, or bruising at the injection site.    Repatha is a cholesterol medication that improved your body's ability to get rid of "bad cholesterol" known as LDL. It can lower your LDL up to 60%! It is an injection that is given under the skin every 2 weeks. The medication often requires a prior authorization from your insurance company.  The most common side effects of Repatha include runny nose, symptoms of the common cold, rarely flu or flu-like symptoms, back/muscle pain in about 3-4% of the patients, and redness, pain, or bruising at the injection site.   Lab orders: We want to repeat labs after 2-3 months.  We will send you a lab order to remind you once we get closer to that time.

## 2023-11-20 NOTE — Assessment & Plan Note (Signed)
 Given hx of NSTEMI and obesity  reasonable to consider GLP1 treatment to lower future risk of MI. We assessed coverage for GLP1 - Wegovy cost >1300 per month and Zepbound >1000/month discussed Zepbound Armed forces training and education officer. Will think about it and let us know if she is interested in going on GLP1 weight loss treatment.

## 2023-11-20 NOTE — Addendum Note (Signed)
 Addended by: Tylene Fantasia on: 11/20/2023 12:18 PM   Modules accepted: Orders

## 2023-11-20 NOTE — Telephone Encounter (Signed)
 Pharmacy Patient Advocate Encounter  Received notification from [facility] ADVANTAGE/RX ADVANCE that Prior Authorization for Repatha has been APPROVED from 11/19/23 to 11/18/24. Ran test claim, Copay is $0.00- one month. This test claim was processed through [facility]- copay amounts may vary at other pharmacies due to pharmacy/plan contracts, or as the patient moves through the different stages of their insurance plan.   PA #/Case ID/Reference #: [id number]

## 2023-11-20 NOTE — Telephone Encounter (Signed)
 Pharmacy Patient Advocate Encounter  Received notification from [facility] ADVANTAGE/RX ADVANCE that Prior Authorization for Ozempic has been DENIED.  Full denial letter will be uploaded to the media tab. See denial reason below.   PA #/Case ID/Reference #: [id number]

## 2023-11-23 ENCOUNTER — Other Ambulatory Visit (HOSPITAL_COMMUNITY): Payer: Self-pay

## 2023-11-23 NOTE — Telephone Encounter (Signed)
 Patient called stating she spoke with her insurance and they told her that if we called [phone] and did PA for CAD that it might be covered better. Pt with hx of NSTEMI.

## 2023-11-24 ENCOUNTER — Telehealth (HOSPITAL_COMMUNITY): Payer: Self-pay

## 2023-11-24 ENCOUNTER — Encounter (HOSPITAL_COMMUNITY): Payer: 59

## 2023-11-24 NOTE — Telephone Encounter (Signed)
 Call to discuss the tech's message from insurance. N/A LVM

## 2023-11-24 NOTE — Telephone Encounter (Signed)
 Patient was held up at work on was just able to answer the phone.  This is why she was not able to attend class today

## 2023-11-26 ENCOUNTER — Encounter (HOSPITAL_COMMUNITY): Payer: 59

## 2023-12-01 ENCOUNTER — Encounter (HOSPITAL_COMMUNITY)
Admission: RE | Admit: 2023-12-01 | Discharge: 2023-12-01 | Disposition: A | Discharge status: Home / Self Care | Payer: 59 | Source: Ambulatory Visit | Attending: Internal Medicine | Admitting: Internal Medicine

## 2023-12-01 DIAGNOSIS — I214 Non-ST elevation (NSTEMI) myocardial infarction: Secondary | ICD-10-CM | POA: Diagnosis not present

## 2023-12-01 NOTE — Progress Notes (Signed)
 Daily Session Note  Patient Details  Name: EDYE HAINLINE MRN: [id number] Date of Birth: 10/01/1960 Referring Provider:   Flowsheet Row CARDIAC REHAB PHASE II ORIENTATION from 10/21/2023 in [facility] CARDIAC REHABILITATION  Referring Provider Epifanio Lesches MD       Encounter Date: 12/01/2023  Check In:  Session Check In - 12/01/23 1500       Check-In   Supervising physician immediately available to respond to emergencies See telemetry face sheet for immediately available MD    Location [facility]    Staff Present Ross Ludwig, BS, Exercise Physiologist;Brittany Roseanne Reno, BSN, RN, WTA-C;Phyllis Billingsley, RN    Virtual Visit No    Medication changes reported     No    Fall or balance concerns reported    No    Tobacco Cessation No Change    Warm-up and Cool-down Performed on first and last piece of equipment    Resistance Training Performed Yes    VAD Patient? No    PAD/SET Patient? No      Pain Assessment   Currently in Pain? No/denies    Multiple Pain Sites No             Capillary Blood Glucose: No results found for this or any previous visit (from the past 24 hours).    Social History   Tobacco Use  Smoking Status Never  Smokeless Tobacco Never    Goals Met:  Independence with exercise equipment Exercise tolerated well No report of concerns or symptoms today Strength training completed today  Goals Unmet:  Not Applicable  Comments: Pt able to follow exercise prescription today without complaint.  Will continue to monitor for progression.

## 2023-12-03 ENCOUNTER — Encounter (HOSPITAL_COMMUNITY)
Admission: RE | Admit: 2023-12-03 | Discharge: 2023-12-03 | Disposition: A | Discharge status: Home / Self Care | Payer: 59 | Source: Ambulatory Visit | Attending: Internal Medicine | Admitting: Internal Medicine

## 2023-12-03 DIAGNOSIS — I214 Non-ST elevation (NSTEMI) myocardial infarction: Secondary | ICD-10-CM

## 2023-12-03 NOTE — Progress Notes (Signed)
 Daily Session Note  Patient Details  Name: Bonnie Martin MRN: [id number] Date of Birth: 29-Oct-1960 Referring Provider:   Flowsheet Row CARDIAC REHAB PHASE II ORIENTATION from 10/21/2023 in [facility] CARDIAC REHABILITATION  Referring Provider Epifanio Lesches MD       Encounter Date: 12/03/2023  Check In:  Session Check In - 12/03/23 1513       Check-In   Supervising physician immediately available to respond to emergencies See telemetry face sheet for immediately available MD    Location [facility]    Staff Present Ross Ludwig, BS, Exercise Physiologist;Hillary Little [place]m BSN, RN;Raihana Balderrama Laural Benes, RN, BSN    Virtual Visit No    Medication changes reported     No    Fall or balance concerns reported    No    Warm-up and Cool-down Performed on first and last piece of equipment    Resistance Training Performed Yes    VAD Patient? No    PAD/SET Patient? No      Pain Assessment   Currently in Pain? No/denies    Multiple Pain Sites No             Capillary Blood Glucose: No results found for this or any previous visit (from the past 24 hours).    Social History   Tobacco Use  Smoking Status Never  Smokeless Tobacco Never    Goals Met:  Independence with exercise equipment Exercise tolerated well No report of concerns or symptoms today Strength training completed today  Goals Unmet:  Not Applicable  Comments: Pt able to follow exercise prescription today without complaint.  Will continue to monitor for progression.

## 2023-12-08 ENCOUNTER — Encounter (HOSPITAL_COMMUNITY): Payer: 59

## 2023-12-09 ENCOUNTER — Encounter (HOSPITAL_COMMUNITY): Payer: Self-pay | Admitting: *Deleted

## 2023-12-09 DIAGNOSIS — I214 Non-ST elevation (NSTEMI) myocardial infarction: Secondary | ICD-10-CM

## 2023-12-09 NOTE — Progress Notes (Signed)
 Cardiac Individual Treatment Plan  Patient Details  Name: Bonnie Martin MRN: [id number] Date of Birth: Jan 11, 1961 Referring Provider:   Flowsheet Row CARDIAC REHAB PHASE II ORIENTATION from 10/21/2023 in [facility] CARDIAC REHABILITATION  Referring Provider Epifanio Lesches MD       Initial Encounter Date:  Flowsheet Row CARDIAC REHAB PHASE II ORIENTATION from 10/21/2023 in [place] CARDIAC REHABILITATION  Date 10/21/23       Visit Diagnosis: NSTEMI (non-ST elevated myocardial infarction) [facility])  Patient's Home Medications on Admission:  Current Outpatient Medications:    Acetaminophen Extra Strength 500 MG TABS, Take 500-1,000 mg by mouth 3 (three) times daily as needed (Pain)., Disp: , Rfl:    allopurinol (ZYLOPRIM) 300 MG tablet, Take 300 mg by mouth every evening., Disp: , Rfl:    amLODipine (NORVASC) 10 MG tablet, Take 10 mg by mouth daily., Disp: , Rfl:    aspirin EC 81 MG tablet, Take 1 tablet (81 mg total) by mouth daily. Swallow whole., Disp: , Rfl:    colchicine 0.6 MG tablet, Take 0.6 mg by mouth daily as needed (Gout)., Disp: , Rfl:    diclofenac (VOLTAREN) 50 MG EC tablet, Take 50 mg by mouth 2 (two) times daily as needed for moderate pain (pain score 4-6)., Disp: , Rfl:    Evolocumab (REPATHA SURECLICK) 140 MG/ML SOAJ, Inject 140 mg into the skin every 14 (fourteen) days., Disp: 6 mL, Rfl: 3   fluticasone (FLONASE) 50 MCG/ACT nasal spray, Place 1 spray into both nostrils daily as needed for allergies or rhinitis., Disp: , Rfl:    furosemide (LASIX) 20 MG tablet, Take 20 mg by mouth 2 (two) times daily., Disp: , Rfl:    halobetasol (ULTRAVATE) 0.05 % cream, Apply 1 Application topically daily., Disp: , Rfl:    hydrocortisone (ANUSOL-HC) 2.5 % rectal cream, Place 1 Application rectally 2 (two) times daily as needed for hemorrhoids or anal itching., Disp: , Rfl:    levothyroxine (SYNTHROID) 112 MCG tablet, Take by mouth., Disp: , Rfl:    methocarbamol (ROBAXIN)  500 MG tablet, Take 1 tablet (500 mg total) by mouth 3 (three) times daily. Medication may cause drowsiness (Patient taking differently: Take 500 mg by mouth 3 (three) times daily as needed for muscle spasms.), Disp: 21 tablet, Rfl: 0   metoprolol (TOPROL-XL) 200 MG 24 hr tablet, Take 200 mg by mouth daily., Disp: , Rfl:    nitroGLYCERIN (NITROSTAT) 0.4 MG SL tablet, Place 1 tablet (0.4 mg total) under the tongue every 5 (five) minutes x 3 doses as needed for chest pain., Disp: 25 tablet, Rfl: 1   omeprazole (PRILOSEC) 40 MG capsule, Take 40 mg by mouth daily as needed (Indigestion)., Disp: , Rfl:    rosuvastatin (CRESTOR) 40 MG tablet, Take 1 tablet (40 mg total) by mouth daily., Disp: 90 tablet, Rfl: 3   valsartan (DIOVAN) 320 MG tablet, Take 320 mg by mouth daily., Disp: , Rfl:   Past Medical History: Past Medical History:  Diagnosis Date   Hypertension    Thyroid disease     Tobacco Use: Social History   Tobacco Use  Smoking Status Never  Smokeless Tobacco Never    Labs: Review Flowsheet       Latest Ref Rng & Units 09/22/2023  Labs for ITP Cardiac and Pulmonary Rehab  Cholestrol 0 - 200 mg/dL 865   LDL (calc) 0 - 99 mg/dL 784   HDL-C >69 mg/dL 65   Trlycerides <629 mg/dL 528   Hemoglobin  A1c 4.8 - 5.6 % 5.7     Capillary Blood Glucose: No results found for: "GLUCAP"   Exercise Target Goals: Exercise Program Goal: Individual exercise prescription set using results from initial 6 min walk test and THRR while considering  patient's activity barriers and safety.   Exercise Prescription Goal: Starting with aerobic activity 30 plus minutes a day, 3 days per week for initial exercise prescription. Provide home exercise prescription and guidelines that participant acknowledges understanding prior to discharge.  Activity Barriers & Risk Stratification:  Activity Barriers & Cardiac Risk Stratification - 10/21/23 1442       Activity Barriers & Cardiac Risk Stratification    Activity Barriers Arthritis    Cardiac Risk Stratification Moderate             6 Minute Walk:  6 Minute Walk     Row Name 10/21/23 1556         6 Minute Walk   Phase Initial     Distance 600 feet     Walk Time 4.77 minutes     # of Rest Breaks 1  1:14     MPH 1.43     METS 1.04     RPE 13     Perceived Dyspnea  1     VO2 Peak 3.67     Symptoms Yes (comment)     Comments R knee pain 5/10     Resting HR 72 bpm     Resting BP 100/60     Resting Oxygen Saturation  100 %     Exercise Oxygen Saturation  during 6 min walk 97 %     Max Ex. HR 119 bpm     Max Ex. BP 134/72     2 Minute Post BP 120/60              Oxygen Initial Assessment:   Oxygen Re-Evaluation:   Oxygen Discharge (Final Oxygen Re-Evaluation):   Initial Exercise Prescription:  Initial Exercise Prescription - 10/21/23 1500       Date of Initial Exercise RX and Referring Provider   Date 10/21/23    Referring Provider Epifanio Lesches MD      Oxygen   Maintain Oxygen Saturation 88% or higher      Treadmill   MPH 1.2    Grade 0    Minutes 15    METs 1.92      NuStep   Level 2    SPM 80    Minutes 15    METs 2      Prescription Details   Frequency (times per week) 2    Duration Progress to 30 minutes of continuous aerobic without signs/symptoms of physical distress      Intensity   THRR 40-80% of Max Heartrate 107-142    Ratings of Perceived Exertion 11-13    Perceived Dyspnea 0-4      Progression   Progression Continue to progress workloads to maintain intensity without signs/symptoms of physical distress.      Resistance Training   Training Prescription Yes    Weight 3 lb    Reps 10-15             Perform Capillary Blood Glucose checks as needed.  Exercise Prescription Changes:   Exercise Prescription Changes     Row Name 10/21/23 1500             Response to Exercise   Blood Pressure (Admit) 100/60  Blood Pressure (Exercise) 134/72        Blood Pressure (Exit) 120/60       Heart Rate (Admit) 72 bpm       Heart Rate (Exercise) 119 bpm       Heart Rate (Exit) 96 bpm       Oxygen Saturation (Admit) 100 %       Oxygen Saturation (Exercise) 97 %       Rating of Perceived Exertion (Exercise) 13       Perceived Dyspnea (Exercise) 1       Symptoms R knee pain 5/10       Comments walk test results                Exercise Comments:   Exercise Comments     Row Name 11/10/23 1457           Exercise Comments First full day of exercise!  Patient was oriented to gym and equipment including functions, settings, policies, and procedures.  Patient's individual exercise prescription and treatment plan were reviewed.  All starting workloads were established based on the results of the 6 minute walk test done at initial orientation visit.  The plan for exercise progression was also introduced and progression will be customized based on patient's performance and goals.                Exercise Goals and Review:   Exercise Goals     Row Name 10/21/23 1602             Exercise Goals   Increase Physical Activity Yes       Intervention Provide advice, education, support and counseling about physical activity/exercise needs.;Develop an individualized exercise prescription for aerobic and resistive training based on initial evaluation findings, risk stratification, comorbidities and participant's personal goals.       Expected Outcomes Short Term: Attend rehab on a regular basis to increase amount of physical activity.;Long Term: Add in home exercise to make exercise part of routine and to increase amount of physical activity.;Long Term: Exercising regularly at least 3-5 days a week.       Increase Strength and Stamina Yes       Intervention Provide advice, education, support and counseling about physical activity/exercise needs.;Develop an individualized exercise prescription for aerobic and resistive training based on initial  evaluation findings, risk stratification, comorbidities and participant's personal goals.       Expected Outcomes Short Term: Increase workloads from initial exercise prescription for resistance, speed, and METs.;Short Term: Perform resistance training exercises routinely during rehab and add in resistance training at home;Long Term: Improve cardiorespiratory fitness, muscular endurance and strength as measured by increased METs and functional capacity ( )       Able to understand and use rate of perceived exertion (RPE) scale Yes       Intervention Provide education and explanation on how to use RPE scale       Expected Outcomes Short Term: Able to use RPE daily in rehab to express subjective intensity level;Long Term:  Able to use RPE to guide intensity level when exercising independently       Able to understand and use Dyspnea scale Yes       Intervention Provide education and explanation on how to use Dyspnea scale       Expected Outcomes Short Term: Able to use Dyspnea scale daily in rehab to express subjective sense of shortness of breath during exertion;Long Term: Able to use Dyspnea scale  to guide intensity level when exercising independently       Knowledge and understanding of Target Heart Rate Range (THRR) Yes       Intervention Provide education and explanation of THRR including how the numbers were predicted and where they are located for reference       Expected Outcomes Short Term: Able to state/look up THRR;Long Term: Able to use THRR to govern intensity when exercising independently;Short Term: Able to use daily as guideline for intensity in rehab       Able to check pulse independently Yes       Intervention Provide education and demonstration on how to check pulse in carotid and radial arteries.;Review the importance of being able to check your own pulse for safety during independent exercise       Expected Outcomes Short Term: Able to explain why pulse checking is important  during independent exercise;Long Term: Able to check pulse independently and accurately       Understanding of Exercise Prescription Yes       Intervention Provide education, explanation, and written materials on patient's individual exercise prescription       Expected Outcomes Short Term: Able to explain program exercise prescription;Long Term: Able to explain home exercise prescription to exercise independently                Exercise Goals Re-Evaluation :  Exercise Goals Re-Evaluation     Row Name 11/10/23 1458 12/03/23 1536           Exercise Goal Re-Evaluation   Exercise Goals Review Able to understand and use rate of perceived exertion (RPE) scale;Knowledge and understanding of Target Heart Rate Range (THRR);Able to understand and use Dyspnea scale;Understanding of Exercise Prescription Increase Physical Activity;Increase Strength and Stamina;Understanding of Exercise Prescription      Comments Reviewed RPE and dyspnea scale, THR and program prescription with pt today.  Pt voiced understanding and was given a copy of goals to take home. Michail Jewels is doing well in rehab. She has noticed a slight increase in her endurance since starting the program. She has not been able to come to some classes due to work meeting but is trying. She wants to start walking since the weather is getting nice and hopes it also helps her knee      Expected Outcomes Short: Use RPE daily to regulate intensity.  Long: Follow program prescription in THR. continue to come tp rehab   add exericse outside of rehab to help with endurance                Discharge Exercise Prescription (Final Exercise Prescription Changes):  Exercise Prescription Changes - 10/21/23 1500       Response to Exercise   Blood Pressure (Admit) 100/60    Blood Pressure (Exercise) 134/72    Blood Pressure (Exit) 120/60    Heart Rate (Admit) 72 bpm    Heart Rate (Exercise) 119 bpm    Heart Rate (Exit) 96 bpm    Oxygen Saturation  (Admit) 100 %    Oxygen Saturation (Exercise) 97 %    Rating of Perceived Exertion (Exercise) 13    Perceived Dyspnea (Exercise) 1    Symptoms R knee pain 5/10    Comments walk test results             Nutrition:  Target Goals: Understanding of nutrition guidelines, daily intake of sodium 1500mg , cholesterol 200mg , calories 30% from fat and 7% or less from saturated fats, daily to  have 5 or more servings of fruits and vegetables.  Biometrics:  Pre Biometrics - 10/21/23 1603       Pre Biometrics   Height 5\' 1"  (1.549 m)    Weight 285 lb 8 oz (129.5 kg)    Waist Circumference 43.5 inches    Hip Circumference 60 inches    Waist to Hip Ratio 0.73 %    BMI (Calculated) 53.97    Grip Strength 27.4 kg    Single Leg Stand 4.5 seconds              Nutrition Therapy Plan and Nutrition Goals:   Nutrition Assessments:  MEDIFICTS Score Key: >=70 Need to make dietary changes  40-70 Heart Healthy Diet <= 40 Therapeutic Level Cholesterol Diet  Flowsheet Row CARDIAC REHAB PHASE II ORIENTATION from 10/21/2023 in [facility] CARDIAC REHABILITATION  Picture Your Plate Total Score on Admission 62      Picture Your Plate Scores: <40 Unhealthy dietary pattern with much room for improvement. 41-50 Dietary pattern unlikely to meet recommendations for good health and room for improvement. 51-60 More healthful dietary pattern, with some room for improvement.  >60 Healthy dietary pattern, although there may be some specific behaviors that could be improved.    Nutrition Goals Re-Evaluation:  Nutrition Goals Re-Evaluation     Row Name 12/03/23 1540             Goals   Nutrition Goal healthy eating                Nutrition Goals Discharge (Final Nutrition Goals Re-Evaluation):  Nutrition Goals Re-Evaluation - 12/03/23 1540       Goals   Nutrition Goal healthy eating             Psychosocial: Target Goals: Acknowledge presence or absence of significant  depression and/or stress, maximize coping skills, provide positive support system. Participant is able to verbalize types and ability to use techniques and skills needed for reducing stress and depression.  Initial Review & Psychosocial Screening:  Initial Psych Review & Screening - 10/21/23 1521       Initial Review   Current issues with None Identified      Family Dynamics   Good Support System? Yes      Screening Interventions   Interventions Encouraged to exercise;To provide support and resources with identified psychosocial needs;Provide feedback about the scores to participant    Expected Outcomes Short Term goal: Utilizing psychosocial counselor, staff and physician to assist with identification of specific Stressors or current issues interfering with healing process. Setting desired goal for each stressor or current issue identified.;Long Term Goal: Stressors or current issues are controlled or eliminated.;Short Term goal: Identification and review with participant of any Quality of Life or Depression concerns found by scoring the questionnaire.;Long Term goal: The participant improves quality of Life and PHQ9 Scores as seen by post scores and/or verbalization of changes             Quality of Life Scores:  Quality of Life - 11/10/23 0928       Quality of Life   Select Quality of Life      Quality of Life Scores   Health/Function Pre 22.63 %    Socioeconomic Pre 23.06 %    Psych/Spiritual Pre 24.86 %    Family Pre 26.4 %    GLOBAL Pre 23.71 %            Scores of 19 and below usually indicate a  poorer quality of life in these areas.  A difference of  2-3 points is a clinically meaningful difference.  A difference of 2-3 points in the total score of the Quality of Life Index has been associated with significant improvement in overall quality of life, self-image, physical symptoms, and general health in studies assessing change in quality of life.  PHQ-9: Review  Flowsheet       10/21/2023 11/07/2016 11/03/2016  Depression screen PHQ 2/9  Decreased Interest 0 0 0  Down, Depressed, Hopeless 0 0 0  PHQ - 2 Score 0 0 0  Altered sleeping 0 - -  Tired, decreased energy 1 - -  Change in appetite 1 - -  Feeling bad or failure about yourself  0 - -  Trouble concentrating 0 - -  Moving slowly or fidgety/restless 0 - -  Suicidal thoughts 0 - -  PHQ-9 Score 2 - -  Difficult doing work/chores Not difficult at all - -   Interpretation of Total Score  Total Score Depression Severity:  1-4 = Minimal depression, 5-9 = Mild depression, 10-14 = Moderate depression, 15-19 = Moderately severe depression, 20-27 = Severe depression   Psychosocial Evaluation and Intervention:  Psychosocial Evaluation - 10/21/23 1522       Psychosocial Evaluation & Interventions   Interventions Stress management education;Relaxation education;Encouraged to exercise with the program and follow exercise prescription    Comments Patient was referred to CR with NSTEMI. She had no CAD but MI was thought to be caused by influenza type A. 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 Her initial PHQ-9 score was 2 due to lack of energy and poor appetite. She denies any depression, anxiety, or stressors. She says she sleeps well. She says she has great support from her husband, son, her daughter and sister and nephews. She seems very motivated to participate in the program. Her goals are to be as healthy as she can; to improve her strength and stamina; and to learn how to take care of herself better. She has no barriers identified to complete the program.    Expected Outcomes Short Term: patient will start the program and attend consistently. Long Term: Complete the program meeting her personal goals.    Continue Psychosocial Services  Follow up required by staff             Psychosocial Re-Evaluation:   Psychosocial Discharge (Final Psychosocial  Re-Evaluation):   Vocational Rehabilitation: Provide vocational rehab assistance to qualifying candidates.   Vocational Rehab Evaluation & Intervention:  Vocational Rehab - 10/21/23 1521       Initial Vocational Rehab Evaluation & Intervention   Assessment shows need for Vocational Rehabilitation No      Vocational Rehab Re-Evaulation   Comments Patient  has returned to work as a Runner, broadcasting/film/video.             Education: Education Goals: Education classes will be provided on a weekly basis, covering required topics. Participant will state understanding/return demonstration of topics presented.  Learning Barriers/Preferences:  Learning Barriers/Preferences - 10/21/23 1529       Learning Barriers/Preferences   Learning Barriers None    Learning Preferences Audio;Written Material;Skilled Demonstration             Education Topics: Hypertension, Hypertension Reduction -Define heart disease and high blood pressure. Discus how high blood pressure affects the body and ways to reduce high blood pressure.   Exercise and Your Heart -Discuss why  it is important to exercise, the FITT principles of exercise, normal and abnormal responses to exercise, and how to exercise safely.   Angina -Discuss definition of angina, causes of angina, treatment of angina, and how to decrease risk of having angina.   Cardiac Medications -Review what the following cardiac medications are used for, how they affect the body, and side effects that may occur when taking the medications.  Medications include Aspirin, Beta blockers, calcium channel blockers, ACE Inhibitors, angiotensin receptor blockers, diuretics, digoxin, and antihyperlipidemics.   Congestive Heart Failure -Discuss the definition of CHF, how to live with CHF, the signs and symptoms of CHF, and how keep track of weight and sodium intake.   Heart Disease and Intimacy -Discus the effect sexual activity has on the heart, how changes occur  during intimacy as we age, and safety during sexual activity.   Smoking Cessation / COPD -Discuss different methods to quit smoking, the health benefits of quitting smoking, and the definition of COPD.   Nutrition I: Fats -Discuss the types of cholesterol, what cholesterol does to the heart, and how cholesterol levels can be controlled.   Nutrition II: Labels -Discuss the different components of food labels and how to read food label   Heart Parts/Heart Disease and PAD -Discuss the anatomy of the heart, the pathway of blood circulation through the heart, and these are affected by heart disease.   Stress I: Signs and Symptoms -Discuss the causes of stress, how stress may lead to anxiety and depression, and ways to limit stress.   Stress II: Relaxation -Discuss different types of relaxation techniques to limit stress.   Warning Signs of Stroke / TIA -Discuss definition of a stroke, what the signs and symptoms are of a stroke, and how to identify when someone is having stroke.   Knowledge Questionnaire Score:  Knowledge Questionnaire Score - 11/10/23 5409       Knowledge Questionnaire Score   Pre Score 25/26             Core Components/Risk Factors/Patient Goals at Admission:  Personal Goals and Risk Factors at Admission - 10/21/23 1521       Core Components/Risk Factors/Patient Goals on Admission    Weight Management Yes;Obesity;Weight Loss    Intervention Weight Management: Develop a combined nutrition and exercise program designed to reach desired caloric intake, while maintaining appropriate intake of nutrient and fiber, sodium and fats, and appropriate energy expenditure required for the weight goal.;Weight Management/Obesity: Establish reasonable short term and long term weight goals.;Weight Management: Provide education and appropriate resources to help participant work on and attain dietary goals.;Obesity: Provide education and appropriate resources to help  participant work on and attain dietary goals.    Admit Weight 285 lb 8 oz (129.5 kg)    Goal Weight: Short Term 280 lb (127 kg)    Goal Weight: Long Term 275 lb (124.7 kg)    Expected Outcomes Short Term: Continue to assess and modify interventions until short term weight is achieved;Long Term: Adherence to nutrition and physical activity/exercise program aimed toward attainment of established weight goal;Weight Loss: Understanding of general recommendations for a balanced deficit meal plan, which promotes 1-2 lb weight loss per week and includes a negative energy balance of [phone] kcal/d;Understanding recommendations for meals to include 15-35% energy as protein, 25-35% energy from fat, 35-60% energy from carbohydrates, less than 200mg  of dietary cholesterol, 20-35 gm of total fiber daily;Understanding of distribution of calorie intake throughout the day with the consumption of 4-5 meals/snacks  Hypertension Yes    Intervention Provide education on lifestyle modifcations including regular physical activity/exercise, weight management, moderate sodium restriction and increased consumption of fresh fruit, vegetables, and low fat dairy, alcohol moderation, and smoking cessation.;Monitor prescription use compliance.    Expected Outcomes Short Term: Continued assessment and intervention until BP is < 140/13mm HG in hypertensive participants. < 130/25mm HG in hypertensive participants with diabetes, heart failure or chronic kidney disease.;Long Term: Maintenance of blood pressure at goal levels.    Lipids Yes    Intervention Provide education and support for participant on nutrition & aerobic/resistive exercise along with prescribed medications to achieve LDL 70mg , HDL >40mg .    Expected Outcomes Short Term: Participant states understanding of desired cholesterol values and is compliant with medications prescribed. Participant is following exercise prescription and nutrition guidelines.;Long Term:  Cholesterol controlled with medications as prescribed, with individualized exercise RX and with personalized nutrition plan. Value goals: LDL < 70mg , HDL > 40 mg.             Core Components/Risk Factors/Patient Goals Review:    Core Components/Risk Factors/Patient Goals at Discharge (Final Review):    ITP Comments:  ITP Comments     Row Name 10/21/23 1609 11/10/23 1457 11/11/23 1422 12/09/23 1252     ITP Comments Patient arrived for 1st visit/orientation/education at 1400. Patient was referred to CR by Dr. Bjorn Pippin due to NSTEMI. During orientation advised patient on arrival and appointment times what to wear, what to do before, during and after exercise. Reviewed attendance and class policy.  Pt is scheduled to return Cardiac Rehab on 10/27/23 at 1500. Pt was advised to come to class 15 minutes before class starts.  Discussed RPE/Dpysnea scales. Patient participated in warm up stretches. Patient was able to complete 6 minute walk test.  Telemetry:NSR. Patient was measured for the equipment. Discussed equipment safety with patient. Took patient pre-anthropometric measurements. Patient finished visit at 1545. First full day of exercise!  Patient was oriented to gym and equipment including functions, settings, policies, and procedures.  Patient's individual exercise prescription and treatment plan were reviewed.  All starting workloads were established based on the results of the 6 minute walk test done at initial orientation visit.  The plan for exercise progression was also introduced and progression will be customized based on patient's performance and goals. 30 day review completed. ITP sent to Dr. Dina Rich, Medical Director of Cardiac Rehab. Continue with ITP unless changes are made by physician.  New to program 30 day review completed. ITP sent to Dr. Dina Rich, Medical Director of Cardiac Rehab. Continue with ITP unless changes are made by physician.             Comments:  30 day review

## 2023-12-10 ENCOUNTER — Encounter (HOSPITAL_COMMUNITY): Payer: 59

## 2023-12-15 ENCOUNTER — Encounter (HOSPITAL_COMMUNITY): Payer: 59

## 2023-12-17 ENCOUNTER — Encounter (HOSPITAL_COMMUNITY): Payer: 59

## 2023-12-18 ENCOUNTER — Other Ambulatory Visit: Payer: Self-pay | Admitting: Physician Assistant

## 2023-12-18 DIAGNOSIS — N63 Unspecified lump in unspecified breast: Secondary | ICD-10-CM

## 2023-12-22 ENCOUNTER — Encounter (HOSPITAL_COMMUNITY): Payer: 59

## 2023-12-23 ENCOUNTER — Telehealth: Payer: Self-pay | Admitting: Internal Medicine

## 2023-12-23 ENCOUNTER — Encounter: Payer: Self-pay | Admitting: Internal Medicine

## 2023-12-23 NOTE — Telephone Encounter (Signed)
   Pre-operative Risk Assessment    Patient Name: Bonnie Martin  DOB: 1961-03-11 MRN: [id number]   Date of last office visit: 10/05/23 Date of next office visit: 01/28/24  Request for Surgical Clearance    Procedure:   Dental Cleaning and Root Canal   Date of Surgery:  Clearance 12/23/23                                Surgeon: Peri Jefferson, DDS  Surgeon's Group or Practice Name:  [facility]  Phone number:  [phone]  Fax number:  [phone]    Type of Clearance Requested:   - Medical    Type of Anesthesia:  Local    Additional requests/questions:    [name]   12/23/2023, 9:32 AM

## 2023-12-23 NOTE — Telephone Encounter (Signed)
    Primary Cardiologist: Christell Constant, MD  Chart reviewed as part of pre-operative protocol coverage. Simple dental extractions/cleanings/root canals are considered low risk procedures per guidelines and generally do not require any specific cardiac clearance. It is also generally accepted that for simple extractions, root canals, and dental cleanings, there is no need to interrupt blood thinner or antiplatelet therapy.   SBE prophylaxis is not required for the patient.  I will route this recommendation to the requesting party via Epic fax function and remove from pre-op pool.  Please call with questions.  Denyce Robert, NP 12/23/2023, 9:48 AM

## 2023-12-24 ENCOUNTER — Encounter (HOSPITAL_COMMUNITY): Payer: 59

## 2023-12-24 DIAGNOSIS — Z48812 Encounter for surgical aftercare following surgery on the circulatory system: Secondary | ICD-10-CM | POA: Insufficient documentation

## 2023-12-24 DIAGNOSIS — I252 Old myocardial infarction: Secondary | ICD-10-CM | POA: Insufficient documentation

## 2023-12-24 DIAGNOSIS — I214 Non-ST elevation (NSTEMI) myocardial infarction: Secondary | ICD-10-CM | POA: Insufficient documentation

## 2023-12-29 ENCOUNTER — Ambulatory Visit
Admission: RE | Admit: 2023-12-29 | Discharge: 2023-12-29 | Disposition: A | Discharge status: Home / Self Care | Source: Ambulatory Visit | Attending: Physician Assistant | Admitting: Physician Assistant

## 2023-12-29 ENCOUNTER — Encounter (HOSPITAL_COMMUNITY)
Admission: RE | Admit: 2023-12-29 | Discharge: 2023-12-29 | Disposition: A | Discharge status: Home / Self Care | Payer: 59 | Source: Ambulatory Visit | Attending: Internal Medicine | Admitting: Internal Medicine

## 2023-12-29 DIAGNOSIS — I214 Non-ST elevation (NSTEMI) myocardial infarction: Secondary | ICD-10-CM | POA: Diagnosis present

## 2023-12-29 DIAGNOSIS — N63 Unspecified lump in unspecified breast: Secondary | ICD-10-CM

## 2023-12-29 DIAGNOSIS — I252 Old myocardial infarction: Secondary | ICD-10-CM | POA: Diagnosis not present

## 2023-12-29 DIAGNOSIS — Z48812 Encounter for surgical aftercare following surgery on the circulatory system: Secondary | ICD-10-CM | POA: Diagnosis not present

## 2023-12-29 NOTE — Progress Notes (Signed)
 Daily Session Note  Patient Details  Name: Bonnie Martin MRN: [id number] Date of Birth: [birth year] Referring Provider:   Flowsheet Row CARDIAC REHAB PHASE II ORIENTATION from 10/21/2023 in [facility] CARD[facility]AC RE[facility]AB[facility]ATION  Referring Provider Carson Clara MD       Encounter Date: 12/29/2023  Check In:  Session Check In - 12/29/23 1500       Check-In   Supervising physician immediately available to respond to emergencies See telemetry face sheet for immediately available MD    Location [facility]    Staff Present Clotilda Danish, BS, Exercise Physiologist;Brittany Annette Barters, BSN, RN, WTA-C;Demetris Meinhardt, RN    Virtual Visit No    Medication changes reported     No    Fall or balance concerns reported    No    Warm-up and Cool-down Performed on first and last piece of equipment    Resistance Training Performed Yes    VAD Patient? No    PAD/SET Patient? No      Pain Assessment   Currently in Pain? No/denies    Multiple Pain Sites No             Capillary Blood Glucose: No results found for this or any previous visit (from the past 24 hours).    Social History   Tobacco Use  Smoking Status Never  Smokeless Tobacco Never    Goals Met:  Independence with exercise equipment Exercise tolerated well No report of concerns or symptoms today Strength training completed today  Goals Unmet:  Not Applicable  Comments: Pt able to follow exercise prescription today without complaint.  Will continue to monitor for progression.

## 2023-12-30 ENCOUNTER — Other Ambulatory Visit (HOSPITAL_COMMUNITY): Payer: Self-pay

## 2023-12-31 ENCOUNTER — Encounter (HOSPITAL_COMMUNITY)
Admission: RE | Admit: 2023-12-31 | Discharge: 2023-12-31 | Disposition: A | Discharge status: Home / Self Care | Payer: 59 | Source: Ambulatory Visit | Attending: Internal Medicine | Admitting: Internal Medicine

## 2023-12-31 DIAGNOSIS — I214 Non-ST elevation (NSTEMI) myocardial infarction: Secondary | ICD-10-CM

## 2023-12-31 DIAGNOSIS — Z48812 Encounter for surgical aftercare following surgery on the circulatory system: Secondary | ICD-10-CM | POA: Diagnosis not present

## 2023-12-31 NOTE — Progress Notes (Signed)
 Daily Session Note  Patient Details  Name: DOREENE FORREY MRN: [id number] Date of Birth: 1961/03/15 Referring Provider:   Flowsheet Row CARDIAC REHAB PHASE II ORIENTATION from 10/21/2023 in [facility] CARDIAC REHABILITATION  Referring Provider Carson Clara MD       Encounter Date: 12/31/2023  Check In:  Session Check In - 12/31/23 1500       Check-In   Supervising physician immediately available to respond to emergencies See telemetry face sheet for immediately available MD    Location [facility]    Staff Present Doug Gehrig, RN, BSN;Brooke Rollin Clock, RN;Heather Alec Huntington, Exercise Physiologist;Hillary Lennie Ra BSN, RN    Virtual Visit No    Medication changes reported     No    Fall or balance concerns reported    No    Warm-up and Cool-down Performed on first and last piece of equipment    Resistance Training Performed Yes    VAD Patient? No    PAD/SET Patient? No      Pain Assessment   Currently in Pain? No/denies    Multiple Pain Sites No             Capillary Blood Glucose: No results found for this or any previous visit (from the past 24 hours).    Social History   Tobacco Use  Smoking Status Never  Smokeless Tobacco Never    Goals Met:  Independence with exercise equipment Exercise tolerated well No report of concerns or symptoms today Strength training completed today  Goals Unmet:  Not Applicable  Comments: Pt able to follow exercise prescription today without complaint.  Will continue to monitor for progression.

## 2024-01-05 ENCOUNTER — Encounter (HOSPITAL_COMMUNITY): Payer: 59

## 2024-01-06 ENCOUNTER — Encounter (HOSPITAL_COMMUNITY): Payer: Self-pay | Admitting: *Deleted

## 2024-01-06 DIAGNOSIS — I214 Non-ST elevation (NSTEMI) myocardial infarction: Secondary | ICD-10-CM

## 2024-01-06 NOTE — Progress Notes (Signed)
 Cardiac Individual Treatment Plan  Patient Details  Name: Bonnie Martin MRN: [id number] Date of Birth: 10-19-1960 Referring Provider:   Flowsheet Row CARDIAC REHAB PHASE II ORIENTATION from 10/21/2023 in [facility] CARDIAC REHABILITATION  Referring Provider Carson Clara MD       Initial Encounter Date:  Flowsheet Row CARDIAC REHAB PHASE II ORIENTATION from 10/21/2023 in [place] CARDIAC REHABILITATION  Date 10/21/23       Visit Diagnosis: NSTEMI (non-ST elevated myocardial infarction) [facility])  Patient's Home Medications on Admission:  Current Outpatient Medications:    Acetaminophen  Extra Strength 500 MG TABS, Take 500-1,000 mg by mouth 3 (three) times daily as needed (Pain)., Disp: , Rfl:    allopurinol  (ZYLOPRIM ) 300 MG tablet, Take 300 mg by mouth every evening., Disp: , Rfl:    amLODipine  (NORVASC ) 10 MG tablet, Take 10 mg by mouth daily., Disp: , Rfl:    aspirin  EC 81 MG tablet, Take 1 tablet (81 mg total) by mouth daily. Swallow whole., Disp: , Rfl:    colchicine 0.6 MG tablet, Take 0.6 mg by mouth daily as needed (Gout)., Disp: , Rfl:    diclofenac  (VOLTAREN) 50 MG EC tablet, Take 50 mg by mouth 2 (two) times daily as needed for moderate pain (pain score 4-6)., Disp: , Rfl:    Evolocumab  (REPATHA  SURECLICK) 140 MG/ML SOAJ, Inject 140 mg into the skin every 14 (fourteen) days., Disp: 6 mL, Rfl: 3   fluticasone (FLONASE) 50 MCG/ACT nasal spray, Place 1 spray into both nostrils daily as needed for allergies or rhinitis., Disp: , Rfl:    furosemide  (LASIX ) 20 MG tablet, Take 20 mg by mouth 2 (two) times daily., Disp: , Rfl:    halobetasol (ULTRAVATE) 0.05 % cream, Apply 1 Application topically daily., Disp: , Rfl:    hydrocortisone (ANUSOL-HC) 2.5 % rectal cream, Place 1 Application rectally 2 (two) times daily as needed for hemorrhoids or anal itching., Disp: , Rfl:    levothyroxine  (SYNTHROID ) 112 MCG tablet, Take by mouth., Disp: , Rfl:    methocarbamol  (ROBAXIN )  500 MG tablet, Take 1 tablet (500 mg total) by mouth 3 (three) times daily. Medication may cause drowsiness (Patient taking differently: Take 500 mg by mouth 3 (three) times daily as needed for muscle spasms.), Disp: 21 tablet, Rfl: 0   metoprolol  (TOPROL -XL) 200 MG 24 hr tablet, Take 200 mg by mouth daily., Disp: , Rfl:    nitroGLYCERIN  (NITROSTAT ) 0.4 MG SL tablet, Place 1 tablet (0.4 mg total) under the tongue every 5 (five) minutes x 3 doses as needed for chest pain., Disp: 25 tablet, Rfl: 1   omeprazole  (PRILOSEC) 40 MG capsule, Take 40 mg by mouth daily as needed (Indigestion)., Disp: , Rfl:    rosuvastatin  (CRESTOR ) 40 MG tablet, Take 1 tablet (40 mg total) by mouth daily., Disp: 90 tablet, Rfl: 3   valsartan (DIOVAN) 320 MG tablet, Take 320 mg by mouth daily., Disp: , Rfl:   Past Medical History: Past Medical History:  Diagnosis Date   Hypertension    Thyroid disease     Tobacco Use: Social History   Tobacco Use  Smoking Status Never  Smokeless Tobacco Never    Labs: Review Flowsheet       Latest Ref Rng & Units 09/22/2023  Labs for ITP Cardiac and Pulmonary Rehab  Cholestrol 0 - 200 mg/dL 409   LDL (calc) 0 - 99 mg/dL 811   HDL-C >91 mg/dL 65   Trlycerides <478 mg/dL 295   Hemoglobin  A1c 4.8 - 5.6 % 5.7     Capillary Blood Glucose: No results found for: "GLUCAP"   Exercise Target Goals: Exercise Program Goal: Individual exercise prescription set using results from initial 6 min walk test and THRR while considering  patient's activity barriers and safety.   Exercise Prescription Goal: Starting with aerobic activity 30 plus minutes a day, 3 days per week for initial exercise prescription. Provide home exercise prescription and guidelines that participant acknowledges understanding prior to discharge.  Activity Barriers & Risk Stratification:  Activity Barriers & Cardiac Risk Stratification - 10/21/23 1442       Activity Barriers & Cardiac Risk Stratification    Activity Barriers Arthritis    Cardiac Risk Stratification Moderate             6 Minute Walk:  6 Minute Walk     Row Name 10/21/23 1556         6 Minute Walk   Phase Initial     Distance 600 feet     Walk Time 4.77 minutes     # of Rest Breaks 1  1:14     MPH 1.43     METS 1.04     RPE 13     Perceived Dyspnea  1     VO2 Peak 3.67     Symptoms Yes (comment)     Comments R knee pain 5/10     Resting HR 72 bpm     Resting BP 100/60     Resting Oxygen Saturation  100 %     Exercise Oxygen Saturation  during 6 min walk 97 %     Max Ex. HR 119 bpm     Max Ex. BP 134/72     2 Minute Post BP 120/60              Oxygen Initial Assessment:   Oxygen Re-Evaluation:   Oxygen Discharge (Final Oxygen Re-Evaluation):   Initial Exercise Prescription:  Initial Exercise Prescription - 10/21/23 1500       Date of Initial Exercise RX and Referring Provider   Date 10/21/23    Referring Provider Carson Clara MD      Oxygen   Maintain Oxygen Saturation 88% or higher      Treadmill   MPH 1.2    Grade 0    Minutes 15    METs 1.92      NuStep   Level 2    SPM 80    Minutes 15    METs 2      Prescription Details   Frequency (times per week) 2    Duration Progress to 30 minutes of continuous aerobic without signs/symptoms of physical distress      Intensity   THRR 40-80% of Max Heartrate 107-142    Ratings of Perceived Exertion 11-13    Perceived Dyspnea 0-4      Progression   Progression Continue to progress workloads to maintain intensity without signs/symptoms of physical distress.      Resistance Training   Training Prescription Yes    Weight 3 lb    Reps 10-15             Perform Capillary Blood Glucose checks as needed.  Exercise Prescription Changes:   Exercise Prescription Changes     Row Name 10/21/23 1500 12/31/23 1500           Response to Exercise   Blood Pressure (Admit) 100/60 126/68  Blood Pressure  (Exercise) 134/72 142/90      Blood Pressure (Exit) 120/60 122/80      Heart Rate (Admit) 72 bpm 65 bpm      Heart Rate (Exercise) 119 bpm 92 bpm      Heart Rate (Exit) 96 bpm 68 bpm      Oxygen Saturation (Admit) 100 % --      Oxygen Saturation (Exercise) 97 % --      Rating of Perceived Exertion (Exercise) 13 14      Perceived Dyspnea (Exercise) 1 1      Symptoms R knee pain 5/10 --      Comments walk test results --      Duration -- Continue with 30 min of aerobic exercise without signs/symptoms of physical distress.      Intensity -- THRR unchanged        Progression   Progression -- Continue to progress workloads to maintain intensity without signs/symptoms of physical distress.        Resistance Training   Training Prescription -- Yes      Weight -- 3      Reps -- 10-15        NuStep   Level -- 3      SPM -- 86      Minutes -- 15      METs -- 2.2        Arm Ergometer   Level -- 3      Watts -- 52      Minutes -- 15      METs -- 1.9               Exercise Comments:   Exercise Comments     Row Name 11/10/23 1457           Exercise Comments First full day of exercise!  Patient was oriented to gym and equipment including functions, settings, policies, and procedures.  Patient's individual exercise prescription and treatment plan were reviewed.  All starting workloads were established based on the results of the 6 minute walk test done at initial orientation visit.  The plan for exercise progression was also introduced and progression will be customized based on patient's performance and goals.                Exercise Goals and Review:   Exercise Goals     Row Name 10/21/23 1602             Exercise Goals   Increase Physical Activity Yes       Intervention Provide advice, education, support and counseling about physical activity/exercise needs.;Develop an individualized exercise prescription for aerobic and resistive training based on initial  evaluation findings, risk stratification, comorbidities and participant's personal goals.       Expected Outcomes Short Term: Attend rehab on a regular basis to increase amount of physical activity.;Long Term: Add in home exercise to make exercise part of routine and to increase amount of physical activity.;Long Term: Exercising regularly at least 3-5 days a week.       Increase Strength and Stamina Yes       Intervention Provide advice, education, support and counseling about physical activity/exercise needs.;Develop an individualized exercise prescription for aerobic and resistive training based on initial evaluation findings, risk stratification, comorbidities and participant's personal goals.       Expected Outcomes Short Term: Increase workloads from initial exercise prescription for resistance, speed, and METs.;Short Term: Perform resistance training exercises routinely  during rehab and add in resistance training at home;Long Term: Improve cardiorespiratory fitness, muscular endurance and strength as measured by increased METs and functional capacity ( )       Able to understand and use rate of perceived exertion (RPE) scale Yes       Intervention Provide education and explanation on how to use RPE scale       Expected Outcomes Short Term: Able to use RPE daily in rehab to express subjective intensity level;Long Term:  Able to use RPE to guide intensity level when exercising independently       Able to understand and use Dyspnea scale Yes       Intervention Provide education and explanation on how to use Dyspnea scale       Expected Outcomes Short Term: Able to use Dyspnea scale daily in rehab to express subjective sense of shortness of breath during exertion;Long Term: Able to use Dyspnea scale to guide intensity level when exercising independently       Knowledge and understanding of Target Heart Rate Range (THRR) Yes       Intervention Provide education and explanation of THRR including how  the numbers were predicted and where they are located for reference       Expected Outcomes Short Term: Able to state/look up THRR;Long Term: Able to use THRR to govern intensity when exercising independently;Short Term: Able to use daily as guideline for intensity in rehab       Able to check pulse independently Yes       Intervention Provide education and demonstration on how to check pulse in carotid and radial arteries.;Review the importance of being able to check your own pulse for safety during independent exercise       Expected Outcomes Short Term: Able to explain why pulse checking is important during independent exercise;Long Term: Able to check pulse independently and accurately       Understanding of Exercise Prescription Yes       Intervention Provide education, explanation, and written materials on patient's individual exercise prescription       Expected Outcomes Short Term: Able to explain program exercise prescription;Long Term: Able to explain home exercise prescription to exercise independently                Exercise Goals Re-Evaluation :  Exercise Goals Re-Evaluation     Row Name 11/10/23 1458 12/03/23 1536           Exercise Goal Re-Evaluation   Exercise Goals Review Able to understand and use rate of perceived exertion (RPE) scale;Knowledge and understanding of Target Heart Rate Range (THRR);Able to understand and use Dyspnea scale;Understanding of Exercise Prescription Increase Physical Activity;Increase Strength and Stamina;Understanding of Exercise Prescription      Comments Reviewed RPE and dyspnea scale, THR and program prescription with pt today.  Pt voiced understanding and was given a copy of goals to take home. Bonnie Martin is doing well in rehab. She has noticed a slight increase in her endurance since starting the program. She has not been able to come to some classes due to work meeting but is trying. She wants to start walking since the weather is getting nice  and hopes it also helps her knee      Expected Outcomes Short: Use RPE daily to regulate intensity.  Long: Follow program prescription in THR. continue to come tp rehab   add exericse outside of rehab to help with endurance  Discharge Exercise Prescription (Final Exercise Prescription Changes):  Exercise Prescription Changes - 12/31/23 1500       Response to Exercise   Blood Pressure (Admit) 126/68    Blood Pressure (Exercise) 142/90    Blood Pressure (Exit) 122/80    Heart Rate (Admit) 65 bpm    Heart Rate (Exercise) 92 bpm    Heart Rate (Exit) 68 bpm    Rating of Perceived Exertion (Exercise) 14    Perceived Dyspnea (Exercise) 1    Duration Continue with 30 min of aerobic exercise without signs/symptoms of physical distress.    Intensity THRR unchanged      Progression   Progression Continue to progress workloads to maintain intensity without signs/symptoms of physical distress.      Resistance Training   Training Prescription Yes    Weight 3    Reps 10-15      NuStep   Level 3    SPM 86    Minutes 15    METs 2.2      Arm Ergometer   Level 3    Watts 52    Minutes 15    METs 1.9             Nutrition:  Target Goals: Understanding of nutrition guidelines, daily intake of sodium 1500mg , cholesterol 200mg , calories 30% from fat and 7% or less from saturated fats, daily to have 5 or more servings of fruits and vegetables.  Biometrics:  Pre Biometrics - 10/21/23 1603       Pre Biometrics   Height 5\' 1"  (1.549 m)    Weight 285 lb 8 oz (129.5 kg)    Waist Circumference 43.5 inches    Hip Circumference 60 inches    Waist to Hip Ratio 0.73 %    BMI (Calculated) 53.97    Grip Strength 27.4 kg    Single Leg Stand 4.5 seconds              Nutrition Therapy Plan and Nutrition Goals:   Nutrition Assessments:  MEDIFICTS Score Key: >=70 Need to make dietary changes  40-70 Heart Healthy Diet <= 40 Therapeutic Level Cholesterol  Diet  Flowsheet Row CARDIAC REHAB PHASE II ORIENTATION from 10/21/2023 in [facility] CARDIAC REHABILITATION  Picture Your Plate Total Score on Admission 62      Picture Your Plate Scores: <29 Unhealthy dietary pattern with much room for improvement. 41-50 Dietary pattern unlikely to meet recommendations for good health and room for improvement. 51-60 More healthful dietary pattern, with some room for improvement.  >60 Healthy dietary pattern, although there may be some specific behaviors that could be improved.    Nutrition Goals Re-Evaluation:  Nutrition Goals Re-Evaluation     Row Name 12/03/23 1540             Goals   Nutrition Goal healthy eating                Nutrition Goals Discharge (Final Nutrition Goals Re-Evaluation):  Nutrition Goals Re-Evaluation - 12/03/23 1540       Goals   Nutrition Goal healthy eating             Psychosocial: Target Goals: Acknowledge presence or absence of significant depression and/or stress, maximize coping skills, provide positive support system. Participant is able to verbalize types and ability to use techniques and skills needed for reducing stress and depression.  Initial Review & Psychosocial Screening:  Initial Psych Review & Screening - 10/21/23 1521  Initial Review   Current issues with None Identified      Family Dynamics   Good Support System? Yes      Screening Interventions   Interventions Encouraged to exercise;To provide support and resources with identified psychosocial needs;Provide feedback about the scores to participant    Expected Outcomes Short Term goal: Utilizing psychosocial counselor, staff and physician to assist with identification of specific Stressors or current issues interfering with healing process. Setting desired goal for each stressor or current issue identified.;Long Term Goal: Stressors or current issues are controlled or eliminated.;Short Term goal: Identification and review  with participant of any Quality of Life or Depression concerns found by scoring the questionnaire.;Long Term goal: The participant improves quality of Life and PHQ9 Scores as seen by post scores and/or verbalization of changes             Quality of Life Scores:  Quality of Life - 11/10/23 0928       Quality of Life   Select Quality of Life      Quality of Life Scores   Health/Function Pre 22.63 %    Socioeconomic Pre 23.06 %    Psych/Spiritual Pre 24.86 %    Family Pre 26.4 %    GLOBAL Pre 23.71 %            Scores of 19 and below usually indicate a poorer quality of life in these areas.  A difference of  2-3 points is a clinically meaningful difference.  A difference of 2-3 points in the total score of the Quality of Life Index has been associated with significant improvement in overall quality of life, self-image, physical symptoms, and general health in studies assessing change in quality of life.  PHQ-9: Review Flowsheet       10/21/2023 11/07/2016 11/03/2016  Depression screen PHQ 2/9  Decreased Interest 0 0 0  Down, Depressed, Hopeless 0 0 0  PHQ - 2 Score 0 0 0  Altered sleeping 0 - -  Tired, decreased energy 1 - -  Change in appetite 1 - -  Feeling bad or failure about yourself  0 - -  Trouble concentrating 0 - -  Moving slowly or fidgety/restless 0 - -  Suicidal thoughts 0 - -  PHQ-9 Score 2 - -  Difficult doing work/chores Not difficult at all - -   Interpretation of Total Score  Total Score Depression Severity:  1-4 = Minimal depression, 5-9 = Mild depression, 10-14 = Moderate depression, 15-19 = Moderately severe depression, 20-27 = Severe depression   Psychosocial Evaluation and Intervention:  Psychosocial Evaluation - 10/21/23 1522       Psychosocial Evaluation & Interventions   Interventions Stress management education;Relaxation education;Encouraged to exercise with the program and follow exercise prescription    Comments Patient was referred to  CR with NSTEMI. She had no CAD but MI was thought to be caused by influenza type A. 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 Her initial PHQ-9 score was 2 due to lack of energy and poor appetite. She denies any depression, anxiety, or stressors. She says she sleeps well. She says she has great support from her husband, son, her daughter and sister and nephews. She seems very motivated to participate in the program. Her goals are to be as healthy as she can; to improve her strength and stamina; and to learn how to take care of herself better. She has no barriers identified to  complete the program.    Expected Outcomes Short Term: patient will start the program and attend consistently. Long Term: Complete the program meeting her personal goals.    Continue Psychosocial Services  Follow up required by staff             Psychosocial Re-Evaluation:   Psychosocial Discharge (Final Psychosocial Re-Evaluation):   Vocational Rehabilitation: Provide vocational rehab assistance to qualifying candidates.   Vocational Rehab Evaluation & Intervention:  Vocational Rehab - 10/21/23 1521       Initial Vocational Rehab Evaluation & Intervention   Assessment shows need for Vocational Rehabilitation No      Vocational Rehab Re-Evaulation   Comments Patient  has returned to work as a Runner, broadcasting/film/video.             Education: Education Goals: Education classes will be provided on a weekly basis, covering required topics. Participant will state understanding/return demonstration of topics presented.  Learning Barriers/Preferences:  Learning Barriers/Preferences - 10/21/23 1529       Learning Barriers/Preferences   Learning Barriers None    Learning Preferences Audio;Written Material;Skilled Demonstration             Education Topics: Hypertension, Hypertension Reduction -Define heart disease and high blood pressure. Discus how high blood pressure affects  the body and ways to reduce high blood pressure.   Exercise and Your Heart -Discuss why it is important to exercise, the FITT principles of exercise, normal and abnormal responses to exercise, and how to exercise safely. Flowsheet Row CARDIAC REHAB PHASE II EXERCISE from 12/31/2023 in [place] CARDIAC REHABILITATION  Date 12/31/23  Educator HB  Instruction Review Code 1- Verbalizes Understanding       Angina -Discuss definition of angina, causes of angina, treatment of angina, and how to decrease risk of having angina.   Cardiac Medications -Review what the following cardiac medications are used for, how they affect the body, and side effects that may occur when taking the medications.  Medications include Aspirin , Beta blockers, calcium  channel blockers, ACE Inhibitors, angiotensin receptor blockers, diuretics, digoxin, and antihyperlipidemics.   Congestive Heart Failure -Discuss the definition of CHF, how to live with CHF, the signs and symptoms of CHF, and how keep track of weight and sodium intake.   Heart Disease and Intimacy -Discus the effect sexual activity has on the heart, how changes occur during intimacy as we age, and safety during sexual activity.   Smoking Cessation / COPD -Discuss different methods to quit smoking, the health benefits of quitting smoking, and the definition of COPD.   Nutrition I: Fats -Discuss the types of cholesterol, what cholesterol does to the heart, and how cholesterol levels can be controlled.   Nutrition II: Labels -Discuss the different components of food labels and how to read food label   Heart Parts/Heart Disease and PAD -Discuss the anatomy of the heart, the pathway of blood circulation through the heart, and these are affected by heart disease.   Stress I: Signs and Symptoms -Discuss the causes of stress, how stress may lead to anxiety and depression, and ways to limit stress.   Stress II: Relaxation -Discuss different  types of relaxation techniques to limit stress.   Warning Signs of Stroke / TIA -Discuss definition of a stroke, what the signs and symptoms are of a stroke, and how to identify when someone is having stroke.   Knowledge Questionnaire Score:  Knowledge Questionnaire Score - 11/10/23 0929       Knowledge Questionnaire Score  Pre Score 25/26             Core Components/Risk Factors/Patient Goals at Admission:  Personal Goals and Risk Factors at Admission - 10/21/23 1521       Core Components/Risk Factors/Patient Goals on Admission    Weight Management Yes;Obesity;Weight Loss    Intervention Weight Management: Develop a combined nutrition and exercise program designed to reach desired caloric intake, while maintaining appropriate intake of nutrient and fiber, sodium and fats, and appropriate energy expenditure required for the weight goal.;Weight Management/Obesity: Establish reasonable short term and long term weight goals.;Weight Management: Provide education and appropriate resources to help participant work on and attain dietary goals.;Obesity: Provide education and appropriate resources to help participant work on and attain dietary goals.    Admit Weight 285 lb 8 oz (129.5 kg)    Goal Weight: Short Term 280 lb (127 kg)    Goal Weight: Long Term 275 lb (124.7 kg)    Expected Outcomes Short Term: Continue to assess and modify interventions until short term weight is achieved;Long Term: Adherence to nutrition and physical activity/exercise program aimed toward attainment of established weight goal;Weight Loss: Understanding of general recommendations for a balanced deficit meal plan, which promotes 1-2 lb weight loss per week and includes a negative energy balance of [phone] kcal/d;Understanding recommendations for meals to include 15-35% energy as protein, 25-35% energy from fat, 35-60% energy from carbohydrates, less than 200mg  of dietary cholesterol, 20-35 gm of total fiber  daily;Understanding of distribution of calorie intake throughout the day with the consumption of 4-5 meals/snacks    Hypertension Yes    Intervention Provide education on lifestyle modifcations including regular physical activity/exercise, weight management, moderate sodium restriction and increased consumption of fresh fruit, vegetables, and low fat dairy, alcohol moderation, and smoking cessation.;Monitor prescription use compliance.    Expected Outcomes Short Term: Continued assessment and intervention until BP is < 140/81mm HG in hypertensive participants. < 130/68mm HG in hypertensive participants with diabetes, heart failure or chronic kidney disease.;Long Term: Maintenance of blood pressure at goal levels.    Lipids Yes    Intervention Provide education and support for participant on nutrition & aerobic/resistive exercise along with prescribed medications to achieve LDL 70mg , HDL >40mg .    Expected Outcomes Short Term: Participant states understanding of desired cholesterol values and is compliant with medications prescribed. Participant is following exercise prescription and nutrition guidelines.;Long Term: Cholesterol controlled with medications as prescribed, with individualized exercise RX and with personalized nutrition plan. Value goals: LDL < 70mg , HDL > 40 mg.             Core Components/Risk Factors/Patient Goals Review:    Core Components/Risk Factors/Patient Goals at Discharge (Final Review):    ITP Comments:  ITP Comments     Row Name 10/21/23 1609 11/10/23 1457 11/11/23 1422 12/09/23 1252 01/06/24 1516   ITP Comments Patient arrived for 1st visit/orientation/education at 1400. Patient was referred to CR by Dr. Alda Amas due to NSTEMI. During orientation advised patient on arrival and appointment times what to wear, what to do before, during and after exercise. Reviewed attendance and class policy.  Pt is scheduled to return Cardiac Rehab on 10/27/23 at 1500. Pt was advised  to come to class 15 minutes before class starts.  Discussed RPE/Dpysnea scales. Patient participated in warm up stretches. Patient was able to complete 6 minute walk test.  Telemetry:NSR. Patient was measured for the equipment. Discussed equipment safety with patient. Took patient pre-anthropometric measurements. Patient finished visit at 1545.  First full day of exercise!  Patient was oriented to gym and equipment including functions, settings, policies, and procedures.  Patient's individual exercise prescription and treatment plan were reviewed.  All starting workloads were established based on the results of the 6 minute walk test done at initial orientation visit.  The plan for exercise progression was also introduced and progression will be customized based on patient's performance and goals. 30 day review completed. ITP sent to Dr. Armida Lander, Medical Director of Cardiac Rehab. Continue with ITP unless changes are made by physician.  New to program 30 day review completed. ITP sent to Dr. Armida Lander, Medical Director of Cardiac Rehab. Continue with ITP unless changes are made by physician. 30 day review completed. ITP sent to Dr. Armida Lander, Medical Director of Cardiac Rehab. Continue with ITP unless changes are made by physician. Bonnie Martin has been out with work and only attend last week for first time, unable to assess for goals.            Comments: 30 day review

## 2024-01-07 ENCOUNTER — Encounter (HOSPITAL_COMMUNITY): Payer: 59

## 2024-01-12 ENCOUNTER — Encounter (HOSPITAL_COMMUNITY): Payer: 59

## 2024-01-14 ENCOUNTER — Encounter (HOSPITAL_COMMUNITY): Payer: 59 | Attending: Internal Medicine

## 2024-01-14 DIAGNOSIS — I214 Non-ST elevation (NSTEMI) myocardial infarction: Secondary | ICD-10-CM | POA: Insufficient documentation

## 2024-01-19 ENCOUNTER — Encounter (HOSPITAL_COMMUNITY): Payer: 59

## 2024-01-21 ENCOUNTER — Ambulatory Visit: Payer: 59 | Attending: Internal Medicine | Admitting: Internal Medicine

## 2024-01-21 ENCOUNTER — Encounter (HOSPITAL_COMMUNITY): Payer: 59

## 2024-01-21 VITALS — BP 148/75 | HR 54 | Ht 62.0 in | Wt 286.0 lb

## 2024-01-21 DIAGNOSIS — E782 Mixed hyperlipidemia: Secondary | ICD-10-CM | POA: Diagnosis not present

## 2024-01-21 DIAGNOSIS — I214 Non-ST elevation (NSTEMI) myocardial infarction: Secondary | ICD-10-CM | POA: Diagnosis not present

## 2024-01-21 DIAGNOSIS — I10 Essential (primary) hypertension: Secondary | ICD-10-CM | POA: Diagnosis not present

## 2024-01-21 DIAGNOSIS — I5033 Acute on chronic diastolic (congestive) heart failure: Secondary | ICD-10-CM | POA: Insufficient documentation

## 2024-01-21 MED ORDER — FUROSEMIDE 40 MG PO TABS: 60.0000 mg | ORAL_TABLET | Freq: Every day | ORAL | 3 refills | Status: DC

## 2024-01-21 NOTE — Progress Notes (Addendum)
 Cardiology Office Note:  .    Date:  01/21/2024  ID:  [name], DOB [birth year], MRN [id number] PCP: Yvonnie Heritage, FNP  [facility] Providers Cardiologist:  Jann Melody, MD     CC: Follow up from hospitalization.  History of Present Illness: Bonnie Martin    Bonnie Martin is a 63 y.o. female with hx NSTEMI (prior for stress cardiomyopathy, possibly myocarditis last time with normal CMR) and hypertension who presents for follow-up of her cardiac condition. She was previously under the care of Doctor [facility].  She has a history of NSTEMI. A heart catheterization on October 02, 2023, showed no evidence of coronary artery disease, and contrast imaging indicated a normal ejection fraction. A cardiac MRI showed no evidence of myocarditis. She has been chest pain free since the initial event, except for one episode a month after discharge, which was managed with medication.  She has hypertension, usually well-controlled at home, but her blood pressure was elevated today due to missing her medication. She is on Repatha  for hyperlipidemia, with the last cholesterol check on October 02, 2023. No cholesterol check was performed today as she has not been fasting.  She is dealing with morbid obesity and is interested in weight loss injections, but the cost is prohibitive. She finds it challenging to attend cardiac rehab due to work commitments and the distance to the facility.  She experiences leg swelling, which she attributes to her knee condition and prolonged standing. She is taking Lasix  20 mg twice daily for this issue. No significant fatigue is reported, although her heart rate is slightly slow on metoprolol .  Discussed the use of AI scribe software for clinical note transcription with the patient, who gave verbal consent to proceed.   Relevant histories: .  Social  - teaches at  [employer] ROS: As per HPI.   Studies Reviewed: .     Cardiac  Studies & Procedures   ______________________________________________________________________________________________ CARDIAC CATHETERIZATION  CARDIAC CATHETERIZATION 09/22/2023  Conclusion No angiographic evidence of CAD LVEDP=5 mmHg  Recommendations: No further ischemic workup  Findings Coronary Findings Diagnostic  Dominance: Right  Left Anterior Descending Vessel is large.  Left Circumflex Vessel is large.  Right Coronary Artery Vessel is large.  Intervention  No interventions have been documented.     ECHOCARDIOGRAM  ECHOCARDIOGRAM COMPLETE 09/22/2023  Narrative ECHOCARDIOGRAM REPORT    Patient Name:   Bonnie Martin Date of Exam: 09/22/2023 Medical Rec #:  [id number]            Height:       64.0 in Accession #:    [id number]           Weight:       279.1 lb Date of Birth:  10-11-1960           BSA:          2.254 m Patient Age:    62 years             BP:           116/69 mmHg Patient Gender: F                    HR:           72 bpm. Exam Location:  Inpatient  Procedure: 2D Echo, Cardiac Doppler, Color Doppler and Intracardiac Opacification Agent  Indications:    NSTEMI  History:        Patient has no prior history  of Echocardiogram examinations. NSTEMI; Risk Factors:Hypertension and HLD.  Sonographer:    Calleen Catena Referring Phys: [id number] LONNIE SULLIVAN  IMPRESSIONS   1. Left ventricular ejection fraction, by estimation, is 60 to 65%. The left ventricle has normal function. The left ventricle has no regional wall motion abnormalities. There is mild concentric left ventricular hypertrophy. Left ventricular diastolic parameters are consistent with Grade I diastolic dysfunction (impaired relaxation). 2. Right ventricular systolic function is normal. The right ventricular size is normal. Tricuspid regurgitation signal is inadequate for assessing PA pressure. 3. The mitral valve is grossly normal. No evidence of mitral valve regurgitation.  No evidence of mitral stenosis. 4. The aortic valve is tricuspid. Aortic valve regurgitation is trivial. Aortic valve sclerosis is present, with no evidence of aortic valve stenosis. 5. The inferior vena cava is normal in size with greater than 50% respiratory variability, suggesting right atrial pressure of 3 mmHg.  FINDINGS Left Ventricle: Left ventricular ejection fraction, by estimation, is 60 to 65%. The left ventricle has normal function. The left ventricle has no regional wall motion abnormalities. Definity  contrast agent was given IV to delineate the left ventricular endocardial borders. The left ventricular internal cavity size was normal in size. There is mild concentric left ventricular hypertrophy. Left ventricular diastolic parameters are consistent with Grade I diastolic dysfunction (impaired relaxation).  Right Ventricle: The right ventricular size is normal. No increase in right ventricular wall thickness. Right ventricular systolic function is normal. Tricuspid regurgitation signal is inadequate for assessing PA pressure.  Left Atrium: Left atrial size was normal in size.  Right Atrium: Right atrial size was normal in size.  Pericardium: There is no evidence of pericardial effusion.  Mitral Valve: The mitral valve is grossly normal. No evidence of mitral valve regurgitation. No evidence of mitral valve stenosis.  Tricuspid Valve: The tricuspid valve is grossly normal. Tricuspid valve regurgitation is trivial. No evidence of tricuspid stenosis.  Aortic Valve: The aortic valve is tricuspid. Aortic valve regurgitation is trivial. Aortic valve sclerosis is present, with no evidence of aortic valve stenosis.  Pulmonic Valve: The pulmonic valve was grossly normal. Pulmonic valve regurgitation is not visualized. No evidence of pulmonic stenosis.  Aorta: The aortic root is normal in size and structure.  Venous: The inferior vena cava is normal in size with greater than 50%  respiratory variability, suggesting right atrial pressure of 3 mmHg.  IAS/Shunts: The atrial septum is grossly normal.   LEFT VENTRICLE PLAX 2D LVIDd:         2.45 cm      Diastology LVIDs:         2.20 cm      LV e' medial:    4.35 cm/s LV PW:         2.20 cm      LV E/e' medial:  10.6 LV IVS:        1.20 cm      LV e' lateral:   8.86 cm/s LVOT diam:     2.00 cm      LV E/e' lateral: 5.2 LV SV:         61 LV SV Index:   27 LVOT Area:     3.14 cm  LV Volumes (MOD) LV vol d, MOD A2C: 110.0 ml LV vol d, MOD A4C: 97.1 ml LV vol s, MOD A2C: 29.8 ml LV vol s, MOD A4C: 45.3 ml LV SV MOD A2C:     80.2 ml LV SV MOD A4C:  97.1 ml LV SV MOD BP:      67.9 ml  RIGHT VENTRICLE            IVC RV Basal diam:  3.40 cm    IVC diam: 1.30 cm RV S prime:     7.62 cm/s TAPSE (M-mode): 1.5 cm  LEFT ATRIUM             Index        RIGHT ATRIUM          Index LA diam:        3.40 cm 1.51 cm/m   RA Area:     9.71 cm LA Vol (A2C):   56.6 ml 25.11 ml/m  RA Volume:   18.90 ml 8.38 ml/m LA Vol (A4C):   33.7 ml 14.95 ml/m LA Biplane Vol: 44.9 ml 19.92 ml/m AORTIC VALVE LVOT Vmax:   117.00 cm/s LVOT Vmean:  73.900 cm/s LVOT VTI:    0.194 m  AORTA Ao Root diam: 3.30 cm  MITRAL VALVE MV Area (PHT): 2.76 cm    SHUNTS MV Decel Time: 275 msec    Systemic VTI:  0.19 m MV E velocity: 46.10 cm/s  Systemic Diam: 2.00 cm MV A velocity: 76.60 cm/s MV E/A ratio:  0.60  Jackquelyn Mass MD Electronically signed by Jackquelyn Mass MD Signature Date/Time: 09/22/2023/11:43:37 AM    Final        CARDIAC MRI  MR CARDIAC MORPHOLOGY W WO CONTRAST 09/23/2023  Narrative CLINICAL DATA:  Clinical question of myocarditis. Study assumes HCT of 33 and BSA of 2.39 m2.  EXAM: CARDIAC MRI  TECHNIQUE: The patient was scanned on a 1.5 Tesla GE magnet. A dedicated cardiac coil was used. Functional imaging was done using Fiesta sequences. 2,3, and 4 chamber views were done to assess for RWMA's. Modified  Simpson's rule using a short axis stack was used to calculate an ejection fraction on a dedicated work [employer]. The patient received 10 cc of Gadavist . After 10 minutes inversion recovery sequences were used to assess for infiltration and scar tissue. Flow quantification was performed 2 times during this examination with flow quantification performed at the levels of the ascending aorta above the valve, pulmonary artery above the valve.  CONTRAST:  10 cc  of Gadavist   FINDINGS: 1. Normal left ventricular size, with LVEDD 50 mm, and LVEDVi 54 mL/m2.  Normal left ventricular thickness.  Normal left ventricular systolic function (LVEF =66%). There are no regional wall motion abnormalities.  Left ventricular parametric mapping notable for normal ECV and T2.  There is no late gadolinium enhancement in the left ventricular myocardium.  2. Normal right ventricular size with RVEDVI 56 mL/m2.  Normal right ventricular thickness.  Normal right ventricular systolic function (RVEF =54%). There are no regional wall motion abnormalities or aneurysms.  3.  Normal right atrial size.  Mild left atrial enlargement.  4.  Moderate pulmonary artery dilation 33 mm.  Normal aortic measurements.  5. Valve assessment:  Aortic Valve: Mild aortic regurgitation. Regurgitant fraction 6%. Tri-leaflet valve.  Pulmonic Valve: No significant pulmonic insufficieny  Tricuspid Valve: Mild tricuspid regurgitation. Regurgitant volume 5%.  Mitral Valve: No significant mitral insufficieny.  6. Normal pericardium. No pericardial effusion. No pericardial enhancement or thickening.  7. Grossly, no extracardiac findings. Recommended dedicated study if concerned for non-cardiac pathology.  IMPRESSION: 1.  Imaging criteria negative for myocarditis.  2.  Normal ventricular function.  Gloriann Larger MD   Electronically Signed By: Arneta Beverage  Paulita Boss M.D. On:  09/23/2023 15:20   ______________________________________________________________________________________________       Physical Exam:    VS:  BP (!) 148/75 (BP Location: Right Arm)   Pulse (!) 54   Ht 5\' 2"  (1.575 m)   Wt 286 lb (129.7 kg)   SpO2 98%   BMI 52.31 kg/m    Wt Readings from Last 3 Encounters:  01/21/24 286 lb (129.7 kg)  10/21/23 285 lb 8 oz (129.5 kg)  10/05/23 275 lb (124.7 kg)    Gen: no distress, morbid obesity   Neck: No JVD Cardiac: No Rubs or Gallops, no murmur but distant heart sounds, regular bradycardia, +2 radial pulses Respiratory: Clear to auscultation bilaterally, normal effort, normal  respiratory rate GI: Soft, nontender, non-distended  MS: No  edema;  moves all extremities Integument: Skin feels warm Neuro:  At time of evaluation, alert and oriented to person/place/time/situation  Psych: Normal affect, patient feels ok   ASSESSMENT AND PLAN: .    HX of NSTEMI NSTEMI previously diagnosed with no current chest pain. Heart catheterization and cardiac MRI showed no evidence of coronary artery disease or myocarditis, suggesting a low-risk phenotype by [facility] standards. - Monitor for recurrence of chest pain or related symptoms.  Acute on chronic Heart failure with preserved ejection fraction Heart failure with preserved ejection fraction. Normal ejection fraction on contrast imaging. Reports leg swelling, possibly related to knee replacement and activity level. Lasix  regimen adjusted to improve symptom management. - Consolidate Lasix  to 60 mg once daily to manage swelling. - Order labs in 1-2 weeks to monitor electrolytes and kidney function on increased diuretic therapy. - Monitor for increased fatigue and adjust metoprolol  dose if necessary.  Hypertension Hypertension with occasional elevated readings, likely due to missed doses of medication. Blood pressure typically controlled when medication is taken as  prescribed.  Hyperlipidemia Hyperlipidemia managed with Repatha . Last cholesterol check on October 02, 2023. No recent fasting lipid panel due to non-fasting state. Plan to reassess cholesterol levels soon. - Order fasting lipid panel in the next couple of weeks to assess cholesterol levels.  Morbid obesity Morbid obesity with interest in weight loss medications. Cost of GLP-1 receptor agonists is a barrier. No history of medullary thyroid cancer or MEN2A syndrome, making her a candidate for GLP-1 receptor agonists if cost issues are resolved. Exploring options for medication assistance. - Provided information on medication assistance programs for GLP-1 receptor agonists through [employer] and [facility] - Consult pharmacy team for potential resources to assist with medication costs.  Documentation ADDENDUM.- because of HTN, MO, and elevated stop bang score, sleep study will be ordered.  Six months with Renelda Carry, one year with me  Gloriann Larger, MD FASE [facility] Cardiologist [facility]  [address] [place], [address] [place], [place] [address] [phone]  3:30 PM

## 2024-01-21 NOTE — Patient Instructions (Signed)
 Medication Instructions:  Your physician has recommended you make the following change in your medication:  STOP: furosemide  twice daily START: furosemide  (Lasix ) 60 mg by mouth once daily *If you need a refill on your cardiac medications before your next appointment, please call your pharmacy*  Lab Work: IN 2 WEEKS at [facility]: fasting lipid panel and BMP (nothing to eat or drink 8-12 hours prior except water)  If you have labs (blood work) drawn today and your tests are completely normal, you will receive your results only by: MyChart Message (if you have MyChart) OR A paper copy in the mail If you have any lab test that is abnormal or we need to change your treatment, we will call you to review the results.  Testing/Procedures: NONE   Follow-Up: At [facility], you and your health needs are our priority.  As part of our continuing mission to provide you with exceptional heart care, our providers are all part of one team.  This team includes your primary Cardiologist (physician) and Advanced Practice Providers or APPs (Physician Assistants and Nurse Practitioners) who all work together to provide you with the care you need, when you need it.  Your next appointment:   4-5 month(s)  Provider:   Charles Connor, NP       Other Instructions Patient notes issues with social determinants of health including Cost of medications.  To support health and well-being beyond medical care, [facility] has partnered with [facility] to  encourage you to [employer], a fast and easy resource to connect patients with community services addressing social determinants of health such as housing, food, transportation, and financial assistance. This platform helps identify local programs tailored to your needs and can empower you to access essential social support.  https://www.[employer].nl

## 2024-01-26 ENCOUNTER — Encounter (HOSPITAL_COMMUNITY): Payer: 59

## 2024-01-27 ENCOUNTER — Telehealth: Payer: Self-pay | Admitting: Pharmacist

## 2024-01-28 ENCOUNTER — Telehealth: Payer: Self-pay | Admitting: Pharmacy Technician

## 2024-01-28 ENCOUNTER — Ambulatory Visit: Payer: 59 | Admitting: Internal Medicine

## 2024-01-28 ENCOUNTER — Encounter (HOSPITAL_COMMUNITY): Payer: 59

## 2024-01-28 ENCOUNTER — Other Ambulatory Visit (HOSPITAL_COMMUNITY): Payer: Self-pay

## 2024-01-28 NOTE — Telephone Encounter (Signed)
 Weight loss GLP1 are cost prohibitive.

## 2024-01-28 NOTE — Telephone Encounter (Signed)
 Pharmacy Patient Advocate Encounter   Received notification from Pt Calls Messages that prior authorization for [facility] is required/requested.   Insurance verification completed.   The patient is insured through [employer] .   Per test claim: The current 01/28/24 day co-pay is, $1285.44- one month.  No PA needed at this time. This test claim was processed through [facility]- copay amounts may vary at other pharmacies due to pharmacy/plan contracts, or as the patient moves through the different stages of their insurance plan.

## 2024-02-01 ENCOUNTER — Telehealth: Payer: Self-pay | Admitting: Pharmacist

## 2024-02-01 NOTE — Telephone Encounter (Signed)
 F/u lipid lab due. Call to remind pt. N/A MyChart sent.

## 2024-02-02 ENCOUNTER — Encounter (HOSPITAL_COMMUNITY): Payer: 59

## 2024-02-03 ENCOUNTER — Encounter (HOSPITAL_COMMUNITY): Payer: Self-pay | Admitting: *Deleted

## 2024-02-03 DIAGNOSIS — I214 Non-ST elevation (NSTEMI) myocardial infarction: Secondary | ICD-10-CM

## 2024-02-03 NOTE — Addendum Note (Signed)
 Addended by: Christine Cozier on: 02/03/2024 08:13 AM   Modules accepted: Orders

## 2024-02-03 NOTE — Progress Notes (Signed)
 Cardiac Individual Treatment Plan  Patient Details  Name: Bonnie Martin MRN: [id number] Date of Birth: Apr 16, 1961 Referring Provider:   Flowsheet Row CARDIAC REHAB PHASE II ORIENTATION from 10/21/2023 in [facility] CARDIAC REHABILITATION  Referring Provider Carson Clara MD       Initial Encounter Date:  Flowsheet Row CARDIAC REHAB PHASE II ORIENTATION from 10/21/2023 in [place] CARDIAC REHABILITATION  Date 10/21/23       Visit Diagnosis: NSTEMI (non-ST elevated myocardial infarction) [facility])  Patient's Home Medications on Admission:  Current Outpatient Medications:    Acetaminophen  Extra Strength 500 MG TABS, Take 500-1,000 mg by mouth 3 (three) times daily as needed (Pain)., Disp: , Rfl:    allopurinol  (ZYLOPRIM ) 300 MG tablet, Take 300 mg by mouth every evening., Disp: , Rfl:    amLODipine  (NORVASC ) 10 MG tablet, Take 1 tablet by mouth daily., Disp: , Rfl:    aspirin  EC 81 MG tablet, Take 1 tablet (81 mg total) by mouth daily. Swallow whole., Disp: , Rfl:    clobetasol ointment (TEMOVATE) 0.05 %, daily., Disp: , Rfl:    Clobetasol Propionate 0.05 % shampoo, Apply topically every 14 (fourteen) days., Disp: , Rfl:    colchicine 0.6 MG tablet, Take 0.6 mg by mouth daily as needed (Gout)., Disp: , Rfl:    Evolocumab  (REPATHA  SURECLICK) 140 MG/ML SOAJ, Inject 140 mg into the skin every 14 (fourteen) days., Disp: 6 mL, Rfl: 3   finasteride (PROSCAR) 5 MG tablet, 2.5 mg daily., Disp: , Rfl:    fluticasone (FLONASE) 50 MCG/ACT nasal spray, Place 1 spray into both nostrils daily as needed for allergies or rhinitis., Disp: , Rfl:    furosemide  (LASIX ) 40 MG tablet, Take 1.5 tablets (60 mg total) by mouth daily., Disp: 135 tablet, Rfl: 3   halobetasol (ULTRAVATE) 0.05 % cream, Apply 1 Application topically daily., Disp: , Rfl:    hydrocortisone (ANUSOL-HC) 2.5 % rectal cream, Place 1 Application rectally 2 (two) times daily as needed for hemorrhoids or anal itching., Disp: ,  Rfl:    levothyroxine  (SYNTHROID ) 112 MCG tablet, Take by mouth., Disp: , Rfl:    metoprolol  (TOPROL -XL) 200 MG 24 hr tablet, Take 200 mg by mouth daily., Disp: , Rfl:    nitroGLYCERIN  (NITROSTAT ) 0.4 MG SL tablet, Place 1 tablet (0.4 mg total) under the tongue every 5 (five) minutes x 3 doses as needed for chest pain., Disp: 25 tablet, Rfl: 1   omeprazole  (PRILOSEC) 40 MG capsule, Take 40 mg by mouth daily as needed (Indigestion)., Disp: , Rfl:    valsartan (DIOVAN) 320 MG tablet, Take 320 mg by mouth daily., Disp: , Rfl:   Past Medical History: Past Medical History:  Diagnosis Date   Hypertension    Thyroid disease     Tobacco Use: Social History   Tobacco Use  Smoking Status Never  Smokeless Tobacco Never    Labs: Review Flowsheet       Latest Ref Rng & Units 09/22/2023  Labs for ITP Cardiac and Pulmonary Rehab  Cholestrol 0 - 200 mg/dL 413   LDL (calc) 0 - 99 mg/dL 244   HDL-C >01 mg/dL 65   Trlycerides <027 mg/dL 253   Hemoglobin G6Y 4.8 - 5.6 % 5.7     Capillary Blood Glucose: No results found for: "GLUCAP"   Exercise Target Goals: Exercise Program Goal: Individual exercise prescription set using results from initial 6 min walk test and THRR while considering  patient's activity barriers and safety.   Exercise  Prescription Goal: Starting with aerobic activity 30 plus minutes a day, 3 days per week for initial exercise prescription. Provide home exercise prescription and guidelines that participant acknowledges understanding prior to discharge.  Activity Barriers & Risk Stratification:  Activity Barriers & Cardiac Risk Stratification - 10/21/23 1442       Activity Barriers & Cardiac Risk Stratification   Activity Barriers Arthritis    Cardiac Risk Stratification Moderate             6 Minute Walk:  6 Minute Walk     Row Name 10/21/23 1556         6 Minute Walk   Phase Initial     Distance 600 feet     Walk Time 4.77 minutes     # of Rest  Breaks 1  1:14     MPH 1.43     METS 1.04     RPE 13     Perceived Dyspnea  1     VO2 Peak 3.67     Symptoms Yes (comment)     Comments R knee pain 5/10     Resting HR 72 bpm     Resting BP 100/60     Resting Oxygen Saturation  100 %     Exercise Oxygen Saturation  during 6 min walk 97 %     Max Ex. HR 119 bpm     Max Ex. BP 134/72     2 Minute Post BP 120/60              Oxygen Initial Assessment:   Oxygen Re-Evaluation:   Oxygen Discharge (Final Oxygen Re-Evaluation):   Initial Exercise Prescription:  Initial Exercise Prescription - 10/21/23 1500       Date of Initial Exercise RX and Referring Provider   Date 10/21/23    Referring Provider Carson Clara MD      Oxygen   Maintain Oxygen Saturation 88% or higher      Treadmill   MPH 1.2    Grade 0    Minutes 15    METs 1.92      NuStep   Level 2    SPM 80    Minutes 15    METs 2      Prescription Details   Frequency (times per week) 2    Duration Progress to 30 minutes of continuous aerobic without signs/symptoms of physical distress      Intensity   THRR 40-80% of Max Heartrate 107-142    Ratings of Perceived Exertion 11-13    Perceived Dyspnea 0-4      Progression   Progression Continue to progress workloads to maintain intensity without signs/symptoms of physical distress.      Resistance Training   Training Prescription Yes    Weight 3 lb    Reps 10-15             Perform Capillary Blood Glucose checks as needed.  Exercise Prescription Changes:   Exercise Prescription Changes     Row Name 10/21/23 1500 12/31/23 1500           Response to Exercise   Blood Pressure (Admit) 100/60 126/68      Blood Pressure (Exercise) 134/72 142/90      Blood Pressure (Exit) 120/60 122/80      Heart Rate (Admit) 72 bpm 65 bpm      Heart Rate (Exercise) 119 bpm 92 bpm      Heart Rate (Exit) 96 bpm 68 bpm  Oxygen Saturation (Admit) 100 % --      Oxygen Saturation (Exercise) 97  % --      Rating of Perceived Exertion (Exercise) 13 14      Perceived Dyspnea (Exercise) 1 1      Symptoms R knee pain 5/10 --      Comments walk test results --      Duration -- Continue with 30 min of aerobic exercise without signs/symptoms of physical distress.      Intensity -- THRR unchanged        Progression   Progression -- Continue to progress workloads to maintain intensity without signs/symptoms of physical distress.        Resistance Training   Training Prescription -- Yes      Weight -- 3      Reps -- 10-15        NuStep   Level -- 3      SPM -- 86      Minutes -- 15      METs -- 2.2        Arm Ergometer   Level -- 3      Watts -- 52      Minutes -- 15      METs -- 1.9               Exercise Comments:   Exercise Comments     Row Name 11/10/23 1457           Exercise Comments First full day of exercise!  Patient was oriented to gym and equipment including functions, settings, policies, and procedures.  Patient's individual exercise prescription and treatment plan were reviewed.  All starting workloads were established based on the results of the 6 minute walk test done at initial orientation visit.  The plan for exercise progression was also introduced and progression will be customized based on patient's performance and goals.                Exercise Goals and Review:   Exercise Goals     Row Name 10/21/23 1602             Exercise Goals   Increase Physical Activity Yes       Intervention Provide advice, education, support and counseling about physical activity/exercise needs.;Develop an individualized exercise prescription for aerobic and resistive training based on initial evaluation findings, risk stratification, comorbidities and participant's personal goals.       Expected Outcomes Short Term: Attend rehab on a regular basis to increase amount of physical activity.;Long Term: Add in home exercise to make exercise part of routine and  to increase amount of physical activity.;Long Term: Exercising regularly at least 3-5 days a week.       Increase Strength and Stamina Yes       Intervention Provide advice, education, support and counseling about physical activity/exercise needs.;Develop an individualized exercise prescription for aerobic and resistive training based on initial evaluation findings, risk stratification, comorbidities and participant's personal goals.       Expected Outcomes Short Term: Increase workloads from initial exercise prescription for resistance, speed, and METs.;Short Term: Perform resistance training exercises routinely during rehab and add in resistance training at home;Long Term: Improve cardiorespiratory fitness, muscular endurance and strength as measured by increased METs and functional capacity ( )       Able to understand and use rate of perceived exertion (RPE) scale Yes       Intervention Provide education and explanation on  how to use RPE scale       Expected Outcomes Short Term: Able to use RPE daily in rehab to express subjective intensity level;Long Term:  Able to use RPE to guide intensity level when exercising independently       Able to understand and use Dyspnea scale Yes       Intervention Provide education and explanation on how to use Dyspnea scale       Expected Outcomes Short Term: Able to use Dyspnea scale daily in rehab to express subjective sense of shortness of breath during exertion;Long Term: Able to use Dyspnea scale to guide intensity level when exercising independently       Knowledge and understanding of Target Heart Rate Range (THRR) Yes       Intervention Provide education and explanation of THRR including how the numbers were predicted and where they are located for reference       Expected Outcomes Short Term: Able to state/look up THRR;Long Term: Able to use THRR to govern intensity when exercising independently;Short Term: Able to use daily as guideline for intensity in  rehab       Able to check pulse independently Yes       Intervention Provide education and demonstration on how to check pulse in carotid and radial arteries.;Review the importance of being able to check your own pulse for safety during independent exercise       Expected Outcomes Short Term: Able to explain why pulse checking is important during independent exercise;Long Term: Able to check pulse independently and accurately       Understanding of Exercise Prescription Yes       Intervention Provide education, explanation, and written materials on patient's individual exercise prescription       Expected Outcomes Short Term: Able to explain program exercise prescription;Long Term: Able to explain home exercise prescription to exercise independently                Exercise Goals Re-Evaluation :  Exercise Goals Re-Evaluation     Row Name 11/10/23 1458 12/03/23 1536           Exercise Goal Re-Evaluation   Exercise Goals Review Able to understand and use rate of perceived exertion (RPE) scale;Knowledge and understanding of Target Heart Rate Range (THRR);Able to understand and use Dyspnea scale;Understanding of Exercise Prescription Increase Physical Activity;Increase Strength and Stamina;Understanding of Exercise Prescription      Comments Reviewed RPE and dyspnea scale, THR and program prescription with pt today.  Pt voiced understanding and was given a copy of goals to take home. Liane Redman is doing well in rehab. She has noticed a slight increase in her endurance since starting the program. She has not been able to come to some classes due to work meeting but is trying. She wants to start walking since the weather is getting nice and hopes it also helps her knee      Expected Outcomes Short: Use RPE daily to regulate intensity.  Long: Follow program prescription in THR. continue to come tp rehab   add exericse outside of rehab to help with endurance                Discharge Exercise  Prescription (Final Exercise Prescription Changes):  Exercise Prescription Changes - 12/31/23 1500       Response to Exercise   Blood Pressure (Admit) 126/68    Blood Pressure (Exercise) 142/90    Blood Pressure (Exit) 122/80    Heart Rate (Admit)  65 bpm    Heart Rate (Exercise) 92 bpm    Heart Rate (Exit) 68 bpm    Rating of Perceived Exertion (Exercise) 14    Perceived Dyspnea (Exercise) 1    Duration Continue with 30 min of aerobic exercise without signs/symptoms of physical distress.    Intensity THRR unchanged      Progression   Progression Continue to progress workloads to maintain intensity without signs/symptoms of physical distress.      Resistance Training   Training Prescription Yes    Weight 3    Reps 10-15      NuStep   Level 3    SPM 86    Minutes 15    METs 2.2      Arm Ergometer   Level 3    Watts 52    Minutes 15    METs 1.9             Nutrition:  Target Goals: Understanding of nutrition guidelines, daily intake of sodium 1500mg , cholesterol 200mg , calories 30% from fat and 7% or less from saturated fats, daily to have 5 or more servings of fruits and vegetables.  Biometrics:  Pre Biometrics - 10/21/23 1603       Pre Biometrics   Height 5\' 1"  (1.549 m)    Weight 285 lb 8 oz (129.5 kg)    Waist Circumference 43.5 inches    Hip Circumference 60 inches    Waist to Hip Ratio 0.73 %    BMI (Calculated) 53.97    Grip Strength 27.4 kg    Single Leg Stand 4.5 seconds              Nutrition Therapy Plan and Nutrition Goals:   Nutrition Assessments:  MEDIFICTS Score Key: >=70 Need to make dietary changes  40-70 Heart Healthy Diet <= 40 Therapeutic Level Cholesterol Diet  Flowsheet Row CARDIAC REHAB PHASE II ORIENTATION from 10/21/2023 in [facility] CARDIAC REHABILITATION  Picture Your Plate Total Score on Admission 62      Picture Your Plate Scores: <16 Unhealthy dietary pattern with much room for improvement. 41-50 Dietary  pattern unlikely to meet recommendations for good health and room for improvement. 51-60 More healthful dietary pattern, with some room for improvement.  >60 Healthy dietary pattern, although there may be some specific behaviors that could be improved.    Nutrition Goals Re-Evaluation:  Nutrition Goals Re-Evaluation     Row Name 12/03/23 1540             Goals   Nutrition Goal healthy eating                Nutrition Goals Discharge (Final Nutrition Goals Re-Evaluation):  Nutrition Goals Re-Evaluation - 12/03/23 1540       Goals   Nutrition Goal healthy eating             Psychosocial: Target Goals: Acknowledge presence or absence of significant depression and/or stress, maximize coping skills, provide positive support system. Participant is able to verbalize types and ability to use techniques and skills needed for reducing stress and depression.  Initial Review & Psychosocial Screening:  Initial Psych Review & Screening - 10/21/23 1521       Initial Review   Current issues with None Identified      Family Dynamics   Good Support System? Yes      Screening Interventions   Interventions Encouraged to exercise;To provide support and resources with identified psychosocial needs;Provide feedback about the scores  to participant    Expected Outcomes Short Term goal: Utilizing psychosocial counselor, staff and physician to assist with identification of specific Stressors or current issues interfering with healing process. Setting desired goal for each stressor or current issue identified.;Long Term Goal: Stressors or current issues are controlled or eliminated.;Short Term goal: Identification and review with participant of any Quality of Life or Depression concerns found by scoring the questionnaire.;Long Term goal: The participant improves quality of Life and PHQ9 Scores as seen by post scores and/or verbalization of changes             Quality of Life Scores:   Quality of Life - 11/10/23 0928       Quality of Life   Select Quality of Life      Quality of Life Scores   Health/Function Pre 22.63 %    Socioeconomic Pre 23.06 %    Psych/Spiritual Pre 24.86 %    Family Pre 26.4 %    GLOBAL Pre 23.71 %            Scores of 19 and below usually indicate a poorer quality of life in these areas.  A difference of  2-3 points is a clinically meaningful difference.  A difference of 2-3 points in the total score of the Quality of Life Index has been associated with significant improvement in overall quality of life, self-image, physical symptoms, and general health in studies assessing change in quality of life.  PHQ-9: Review Flowsheet       10/21/2023 11/07/2016 11/03/2016  Depression screen PHQ 2/9  Decreased Interest 0 0 0  Down, Depressed, Hopeless 0 0 0  PHQ - 2 Score 0 0 0  Altered sleeping 0 - -  Tired, decreased energy 1 - -  Change in appetite 1 - -  Feeling bad or failure about yourself  0 - -  Trouble concentrating 0 - -  Moving slowly or fidgety/restless 0 - -  Suicidal thoughts 0 - -  PHQ-9 Score 2 - -  Difficult doing work/chores Not difficult at all - -   Interpretation of Total Score  Total Score Depression Severity:  1-4 = Minimal depression, 5-9 = Mild depression, 10-14 = Moderate depression, 15-19 = Moderately severe depression, 20-27 = Severe depression   Psychosocial Evaluation and Intervention:  Psychosocial Evaluation - 10/21/23 1522       Psychosocial Evaluation & Interventions   Interventions Stress management education;Relaxation education;Encouraged to exercise with the program and follow exercise prescription    Comments Patient was referred to CR with NSTEMI. She had no CAD but MI was thought to be caused by influenza type A. 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 Her initial PHQ-9 score was 2 due to lack of energy and poor appetite. She denies any depression, anxiety,  or stressors. She says she sleeps well. She says she has great support from her husband, son, her daughter and sister and nephews. She seems very motivated to participate in the program. Her goals are to be as healthy as she can; to improve her strength and stamina; and to learn how to take care of herself better. She has no barriers identified to complete the program.    Expected Outcomes Short Term: patient will start the program and attend consistently. Long Term: Complete the program meeting her personal goals.    Continue Psychosocial Services  Follow up required by staff  Psychosocial Re-Evaluation:   Psychosocial Discharge (Final Psychosocial Re-Evaluation):   Vocational Rehabilitation: Provide vocational rehab assistance to qualifying candidates.   Vocational Rehab Evaluation & Intervention:  Vocational Rehab - 10/21/23 1521       Initial Vocational Rehab Evaluation & Intervention   Assessment shows need for Vocational Rehabilitation No      Vocational Rehab Re-Evaulation   Comments Patient  has returned to work as a Runner, broadcasting/film/video.             Education: Education Goals: Education classes will be provided on a weekly basis, covering required topics. Participant will state understanding/return demonstration of topics presented.  Learning Barriers/Preferences:  Learning Barriers/Preferences - 10/21/23 1529       Learning Barriers/Preferences   Learning Barriers None    Learning Preferences Audio;Written Material;Skilled Demonstration             Education Topics: Hypertension, Hypertension Reduction -Define heart disease and high blood pressure. Discus how high blood pressure affects the body and ways to reduce high blood pressure.   Exercise and Your Heart -Discuss why it is important to exercise, the FITT principles of exercise, normal and abnormal responses to exercise, and how to exercise safely. Flowsheet Row CARDIAC REHAB PHASE II EXERCISE  from 12/31/2023 in [place] CARDIAC REHABILITATION  Date 12/31/23  Educator HB  Instruction Review Code 1- Verbalizes Understanding       Angina -Discuss definition of angina, causes of angina, treatment of angina, and how to decrease risk of having angina.   Cardiac Medications -Review what the following cardiac medications are used for, how they affect the body, and side effects that may occur when taking the medications.  Medications include Aspirin , Beta blockers, calcium  channel blockers, ACE Inhibitors, angiotensin receptor blockers, diuretics, digoxin, and antihyperlipidemics.   Congestive Heart Failure -Discuss the definition of CHF, how to live with CHF, the signs and symptoms of CHF, and how keep track of weight and sodium intake.   Heart Disease and Intimacy -Discus the effect sexual activity has on the heart, how changes occur during intimacy as we age, and safety during sexual activity.   Smoking Cessation / COPD -Discuss different methods to quit smoking, the health benefits of quitting smoking, and the definition of COPD.   Nutrition I: Fats -Discuss the types of cholesterol, what cholesterol does to the heart, and how cholesterol levels can be controlled.   Nutrition II: Labels -Discuss the different components of food labels and how to read food label   Heart Parts/Heart Disease and PAD -Discuss the anatomy of the heart, the pathway of blood circulation through the heart, and these are affected by heart disease.   Stress I: Signs and Symptoms -Discuss the causes of stress, how stress may lead to anxiety and depression, and ways to limit stress.   Stress II: Relaxation -Discuss different types of relaxation techniques to limit stress.   Warning Signs of Stroke / TIA -Discuss definition of a stroke, what the signs and symptoms are of a stroke, and how to identify when someone is having stroke.   Knowledge Questionnaire Score:  Knowledge  Questionnaire Score - 11/10/23 0929       Knowledge Questionnaire Score   Pre Score 25/26             Core Components/Risk Factors/Patient Goals at Admission:  Personal Goals and Risk Factors at Admission - 10/21/23 1521       Core Components/Risk Factors/Patient Goals on Admission    Weight Management Yes;Obesity;Weight  Loss    Intervention Weight Management: Develop a combined nutrition and exercise program designed to reach desired caloric intake, while maintaining appropriate intake of nutrient and fiber, sodium and fats, and appropriate energy expenditure required for the weight goal.;Weight Management/Obesity: Establish reasonable short term and long term weight goals.;Weight Management: Provide education and appropriate resources to help participant work on and attain dietary goals.;Obesity: Provide education and appropriate resources to help participant work on and attain dietary goals.    Admit Weight 285 lb 8 oz (129.5 kg)    Goal Weight: Short Term 280 lb (127 kg)    Goal Weight: Long Term 275 lb (124.7 kg)    Expected Outcomes Short Term: Continue to assess and modify interventions until short term weight is achieved;Long Term: Adherence to nutrition and physical activity/exercise program aimed toward attainment of established weight goal;Weight Loss: Understanding of general recommendations for a balanced deficit meal plan, which promotes 1-2 lb weight loss per week and includes a negative energy balance of [phone] kcal/d;Understanding recommendations for meals to include 15-35% energy as protein, 25-35% energy from fat, 35-60% energy from carbohydrates, less than 200mg  of dietary cholesterol, 20-35 gm of total fiber daily;Understanding of distribution of calorie intake throughout the day with the consumption of 4-5 meals/snacks    Hypertension Yes    Intervention Provide education on lifestyle modifcations including regular physical activity/exercise, weight management,  moderate sodium restriction and increased consumption of fresh fruit, vegetables, and low fat dairy, alcohol moderation, and smoking cessation.;Monitor prescription use compliance.    Expected Outcomes Short Term: Continued assessment and intervention until BP is < 140/35mm HG in hypertensive participants. < 130/56mm HG in hypertensive participants with diabetes, heart failure or chronic kidney disease.;Long Term: Maintenance of blood pressure at goal levels.    Lipids Yes    Intervention Provide education and support for participant on nutrition & aerobic/resistive exercise along with prescribed medications to achieve LDL 70mg , HDL >40mg .    Expected Outcomes Short Term: Participant states understanding of desired cholesterol values and is compliant with medications prescribed. Participant is following exercise prescription and nutrition guidelines.;Long Term: Cholesterol controlled with medications as prescribed, with individualized exercise RX and with personalized nutrition plan. Value goals: LDL < 70mg , HDL > 40 mg.             Core Components/Risk Factors/Patient Goals Review:    Core Components/Risk Factors/Patient Goals at Discharge (Final Review):    ITP Comments:  ITP Comments     Row Name 10/21/23 1609 11/10/23 1457 11/11/23 1422 12/09/23 1252 01/06/24 1516   ITP Comments Patient arrived for 1st visit/orientation/education at 1400. Patient was referred to CR by Dr. Alda Amas due to NSTEMI. During orientation advised patient on arrival and appointment times what to wear, what to do before, during and after exercise. Reviewed attendance and class policy.  Pt is scheduled to return Cardiac Rehab on 10/27/23 at 1500. Pt was advised to come to class 15 minutes before class starts.  Discussed RPE/Dpysnea scales. Patient participated in warm up stretches. Patient was able to complete 6 minute walk test.  Telemetry:NSR. Patient was measured for the equipment. Discussed equipment safety with  patient. Took patient pre-anthropometric measurements. Patient finished visit at 1545. First full day of exercise!  Patient was oriented to gym and equipment including functions, settings, policies, and procedures.  Patient's individual exercise prescription and treatment plan were reviewed.  All starting workloads were established based on the results of the 6 minute walk test done at initial orientation visit.  The plan for exercise progression was also introduced and progression will be customized based on patient's performance and goals. 30 day review completed. ITP sent to Dr. Armida Lander, Medical Director of Cardiac Rehab. Continue with ITP unless changes are made by physician.  New to program 30 day review completed. ITP sent to Dr. Armida Lander, Medical Director of Cardiac Rehab. Continue with ITP unless changes are made by physician. 30 day review completed. ITP sent to Dr. Armida Lander, Medical Director of Cardiac Rehab. Continue with ITP unless changes are made by physician. Kandie has been out with work and only attend last week for first time, unable to assess for goals.    Row Name 02/03/24 1154           ITP Comments 30 day review completed. ITP sent to Dr. Armida Lander, Medical Director of Cardiac Rehab. Continue with ITP unless changes are made by physician. Marya continues to be out with work  last attended on 12/31/23, unable to assess for goals.                Comments: 30 day review

## 2024-02-04 ENCOUNTER — Encounter (HOSPITAL_COMMUNITY): Payer: 59

## 2024-02-09 ENCOUNTER — Encounter (HOSPITAL_COMMUNITY): Payer: 59

## 2024-02-11 ENCOUNTER — Encounter (HOSPITAL_COMMUNITY): Payer: 59

## 2024-02-15 ENCOUNTER — Telehealth: Payer: Self-pay

## 2024-02-15 NOTE — Telephone Encounter (Signed)
**Note De-Identified Symphoni Helbling Obfuscation** Ordering provider: Dr Paulita Boss Associated diagnoses: Obesity-E66.01 and Gerd-K21.9 WatchPAT PA obtained on 02/15/2024 by Takhia Spoon, Isabella Mao, LPN. Authorization: Per the [employer] website: [facility] does not require a prior authorization for CPT Code: 21308 (Itamar-HST).  Patient notified of PIN (1234) on 02/15/2024 Selenia Mihok Notification Method: MyChart message.  Per the pts chart she was not given a Itamar-HST Device while at her office visit on 5/8 so I called the pt but got no answer so I sent her a [facility] message advising her that if she did not receive a device while at the office to contact us  so we can schedule a time for her to come to the office to pick one up and to get instructions on how to use it.  Phone note routed to covering staff for follow-up.

## 2024-02-16 ENCOUNTER — Encounter (HOSPITAL_COMMUNITY): Payer: 59

## 2024-02-18 ENCOUNTER — Encounter (HOSPITAL_COMMUNITY): Payer: 59

## 2024-02-23 ENCOUNTER — Encounter (HOSPITAL_COMMUNITY): Payer: 59

## 2024-03-02 NOTE — Addendum Note (Signed)
 Encounter addended by: [place], RN on: 03/02/2024 11:10 AM  Actions taken: Flowsheet data copied forward, Flowsheet accepted, Clinical Note Signed

## 2024-03-02 NOTE — Progress Notes (Signed)
 Cardiac Individual Treatment Plan  Patient Details  Name: Bonnie Martin MRN: [id number] Date of Birth: 07-23-1961 Referring Provider:   Flowsheet Row CARDIAC REHAB PHASE II ORIENTATION from 10/21/2023 in [facility] CARDIAC REHABILITATION  Referring Provider Carson Clara MD    Initial Encounter Date:  Flowsheet Row CARDIAC REHAB PHASE II ORIENTATION from 10/21/2023 in [place] CARDIAC REHABILITATION  Date 10/21/23    Visit Diagnosis: NSTEMI (non-ST elevated myocardial infarction) [facility])  Patient's Home Medications on Admission:  Current Outpatient Medications:    Acetaminophen  Extra Strength 500 MG TABS, Take 500-1,000 mg by mouth 3 (three) times daily as needed (Pain)., Disp: , Rfl:    allopurinol  (ZYLOPRIM ) 300 MG tablet, Take 300 mg by mouth every evening., Disp: , Rfl:    amLODipine  (NORVASC ) 10 MG tablet, Take 1 tablet by mouth daily., Disp: , Rfl:    aspirin  EC 81 MG tablet, Take 1 tablet (81 mg total) by mouth daily. Swallow whole., Disp: , Rfl:    clobetasol ointment (TEMOVATE) 0.05 %, daily., Disp: , Rfl:    Clobetasol Propionate 0.05 % shampoo, Apply topically every 14 (fourteen) days., Disp: , Rfl:    colchicine 0.6 MG tablet, Take 0.6 mg by mouth daily as needed (Gout)., Disp: , Rfl:    Evolocumab  (REPATHA  SURECLICK) 140 MG/ML SOAJ, Inject 140 mg into the skin every 14 (fourteen) days., Disp: 6 mL, Rfl: 3   finasteride (PROSCAR) 5 MG tablet, 2.5 mg daily., Disp: , Rfl:    fluticasone (FLONASE) 50 MCG/ACT nasal spray, Place 1 spray into both nostrils daily as needed for allergies or rhinitis., Disp: , Rfl:    furosemide  (LASIX ) 40 MG tablet, Take 1.5 tablets (60 mg total) by mouth daily., Disp: 135 tablet, Rfl: 3   halobetasol (ULTRAVATE) 0.05 % cream, Apply 1 Application topically daily., Disp: , Rfl:    hydrocortisone (ANUSOL-HC) 2.5 % rectal cream, Place 1 Application rectally 2 (two) times daily as needed for hemorrhoids or anal itching., Disp: , Rfl:     levothyroxine  (SYNTHROID ) 112 MCG tablet, Take by mouth., Disp: , Rfl:    metoprolol  (TOPROL -XL) 200 MG 24 hr tablet, Take 200 mg by mouth daily., Disp: , Rfl:    nitroGLYCERIN  (NITROSTAT ) 0.4 MG SL tablet, Place 1 tablet (0.4 mg total) under the tongue every 5 (five) minutes x 3 doses as needed for chest pain., Disp: 25 tablet, Rfl: 1   omeprazole  (PRILOSEC) 40 MG capsule, Take 40 mg by mouth daily as needed (Indigestion)., Disp: , Rfl:    valsartan (DIOVAN) 320 MG tablet, Take 320 mg by mouth daily., Disp: , Rfl:   Past Medical History: Past Medical History:  Diagnosis Date   Hypertension    Thyroid disease     Tobacco Use: Social History   Tobacco Use  Smoking Status Never  Smokeless Tobacco Never    Labs: Review Flowsheet       Latest Ref Rng & Units 09/22/2023  Labs for ITP Cardiac and Pulmonary Rehab  Cholestrol 0 - 200 mg/dL 782   LDL (calc) 0 - 99 mg/dL 956   HDL-C >21 mg/dL 65   Trlycerides <308 mg/dL 657   Hemoglobin Q4O 4.8 - 5.6 % 5.7     Capillary Blood Glucose: No results found for: GLUCAP   Exercise Target Goals: Exercise Program Goal: Individual exercise prescription set using results from initial 6 min walk test and THRR while considering  patient's activity barriers and safety.   Exercise Prescription Goal: Starting with aerobic activity  30 plus minutes a day, 3 days per week for initial exercise prescription. Provide home exercise prescription and guidelines that participant acknowledges understanding prior to discharge.  Activity Barriers & Risk Stratification:  Activity Barriers & Cardiac Risk Stratification - 10/21/23 1442       Activity Barriers & Cardiac Risk Stratification   Activity Barriers Arthritis    Cardiac Risk Stratification Moderate          6 Minute Walk:  6 Minute Walk     Row Name 10/21/23 1556         6 Minute Walk   Phase Initial     Distance 600 feet     Walk Time 4.77 minutes     # of Rest Breaks 1  1:14      MPH 1.43     METS 1.04     RPE 13     Perceived Dyspnea  1     VO2 Peak 3.67     Symptoms Yes (comment)     Comments R knee pain 5/10     Resting HR 72 bpm     Resting BP 100/60     Resting Oxygen Saturation  100 %     Exercise Oxygen Saturation  during 6 min walk 97 %     Max Ex. HR 119 bpm     Max Ex. BP 134/72     2 Minute Post BP 120/60        Oxygen Initial Assessment:   Oxygen Re-Evaluation:   Oxygen Discharge (Final Oxygen Re-Evaluation):   Initial Exercise Prescription:  Initial Exercise Prescription - 10/21/23 1500       Date of Initial Exercise RX and Referring Provider   Date 10/21/23    Referring Provider Carson Clara MD      Oxygen   Maintain Oxygen Saturation 88% or higher      Treadmill   MPH 1.2    Grade 0    Minutes 15    METs 1.92      NuStep   Level 2    SPM 80    Minutes 15    METs 2      Prescription Details   Frequency (times per week) 2    Duration Progress to 30 minutes of continuous aerobic without signs/symptoms of physical distress      Intensity   THRR 40-80% of Max Heartrate 107-142    Ratings of Perceived Exertion 11-13    Perceived Dyspnea 0-4      Progression   Progression Continue to progress workloads to maintain intensity without signs/symptoms of physical distress.      Resistance Training   Training Prescription Yes    Weight 3 lb    Reps 10-15          Perform Capillary Blood Glucose checks as needed.  Exercise Prescription Changes:   Exercise Prescription Changes     Row Name 10/21/23 1500 12/31/23 1500           Response to Exercise   Blood Pressure (Admit) 100/60 126/68      Blood Pressure (Exercise) 134/72 142/90      Blood Pressure (Exit) 120/60 122/80      Heart Rate (Admit) 72 bpm 65 bpm      Heart Rate (Exercise) 119 bpm 92 bpm      Heart Rate (Exit) 96 bpm 68 bpm      Oxygen Saturation (Admit) 100 % --      Oxygen Saturation (  Exercise) 97 % --      Rating of Perceived  Exertion (Exercise) 13 14      Perceived Dyspnea (Exercise) 1 1      Symptoms R knee pain 5/10 --      Comments walk test results --      Duration -- Continue with 30 min of aerobic exercise without signs/symptoms of physical distress.      Intensity -- THRR unchanged        Progression   Progression -- Continue to progress workloads to maintain intensity without signs/symptoms of physical distress.        Resistance Training   Training Prescription -- Yes      Weight -- 3      Reps -- 10-15        NuStep   Level -- 3      SPM -- 86      Minutes -- 15      METs -- 2.2        Arm Ergometer   Level -- 3      Watts -- 52      Minutes -- 15      METs -- 1.9         Exercise Comments:   Exercise Comments     Row Name 11/10/23 1457           Exercise Comments First full day of exercise!  Patient was oriented to gym and equipment including functions, settings, policies, and procedures.  Patient's individual exercise prescription and treatment plan were reviewed.  All starting workloads were established based on the results of the 6 minute walk test done at initial orientation visit.  The plan for exercise progression was also introduced and progression will be customized based on patient's performance and goals.          Exercise Goals and Review:   Exercise Goals     Row Name 10/21/23 1602             Exercise Goals   Increase Physical Activity Yes       Intervention Provide advice, education, support and counseling about physical activity/exercise needs.;Develop an individualized exercise prescription for aerobic and resistive training based on initial evaluation findings, risk stratification, comorbidities and participant's personal goals.       Expected Outcomes Short Term: Attend rehab on a regular basis to increase amount of physical activity.;Long Term: Add in home exercise to make exercise part of routine and to increase amount of physical activity.;Long Term:  Exercising regularly at least 3-5 days a week.       Increase Strength and Stamina Yes       Intervention Provide advice, education, support and counseling about physical activity/exercise needs.;Develop an individualized exercise prescription for aerobic and resistive training based on initial evaluation findings, risk stratification, comorbidities and participant's personal goals.       Expected Outcomes Short Term: Increase workloads from initial exercise prescription for resistance, speed, and METs.;Short Term: Perform resistance training exercises routinely during rehab and add in resistance training at home;Long Term: Improve cardiorespiratory fitness, muscular endurance and strength as measured by increased METs and functional capacity ( )       Able to understand and use rate of perceived exertion (RPE) scale Yes       Intervention Provide education and explanation on how to use RPE scale       Expected Outcomes Short Term: Able to use RPE daily in rehab to express subjective  intensity level;Long Term:  Able to use RPE to guide intensity level when exercising independently       Able to understand and use Dyspnea scale Yes       Intervention Provide education and explanation on how to use Dyspnea scale       Expected Outcomes Short Term: Able to use Dyspnea scale daily in rehab to express subjective sense of shortness of breath during exertion;Long Term: Able to use Dyspnea scale to guide intensity level when exercising independently       Knowledge and understanding of Target Heart Rate Range (THRR) Yes       Intervention Provide education and explanation of THRR including how the numbers were predicted and where they are located for reference       Expected Outcomes Short Term: Able to state/look up THRR;Long Term: Able to use THRR to govern intensity when exercising independently;Short Term: Able to use daily as guideline for intensity in rehab       Able to check pulse independently Yes        Intervention Provide education and demonstration on how to check pulse in carotid and radial arteries.;Review the importance of being able to check your own pulse for safety during independent exercise       Expected Outcomes Short Term: Able to explain why pulse checking is important during independent exercise;Long Term: Able to check pulse independently and accurately       Understanding of Exercise Prescription Yes       Intervention Provide education, explanation, and written materials on patient's individual exercise prescription       Expected Outcomes Short Term: Able to explain program exercise prescription;Long Term: Able to explain home exercise prescription to exercise independently          Exercise Goals Re-Evaluation :  Exercise Goals Re-Evaluation     Row Name 11/10/23 1458 12/03/23 1536           Exercise Goal Re-Evaluation   Exercise Goals Review Able to understand and use rate of perceived exertion (RPE) scale;Knowledge and understanding of Target Heart Rate Range (THRR);Able to understand and use Dyspnea scale;Understanding of Exercise Prescription Increase Physical Activity;Increase Strength and Stamina;Understanding of Exercise Prescription      Comments Reviewed RPE and dyspnea scale, THR and program prescription with pt today.  Pt voiced understanding and was given a copy of goals to take home. Liane Redman is doing well in rehab. She has noticed a slight increase in her endurance since starting the program. She has not been able to come to some classes due to work meeting but is trying. She wants to start walking since the weather is getting nice and hopes it also helps her knee      Expected Outcomes Short: Use RPE daily to regulate intensity.  Long: Follow program prescription in THR. continue to come tp rehab   add exericse outside of rehab to help with endurance          Discharge Exercise Prescription (Final Exercise Prescription Changes):  Exercise Prescription  Changes - 12/31/23 1500       Response to Exercise   Blood Pressure (Admit) 126/68    Blood Pressure (Exercise) 142/90    Blood Pressure (Exit) 122/80    Heart Rate (Admit) 65 bpm    Heart Rate (Exercise) 92 bpm    Heart Rate (Exit) 68 bpm    Rating of Perceived Exertion (Exercise) 14    Perceived Dyspnea (Exercise) 1  Duration Continue with 30 min of aerobic exercise without signs/symptoms of physical distress.    Intensity THRR unchanged      Progression   Progression Continue to progress workloads to maintain intensity without signs/symptoms of physical distress.      Resistance Training   Training Prescription Yes    Weight 3    Reps 10-15      NuStep   Level 3    SPM 86    Minutes 15    METs 2.2      Arm Ergometer   Level 3    Watts 52    Minutes 15    METs 1.9          Nutrition:  Target Goals: Understanding of nutrition guidelines, daily intake of sodium 1500mg , cholesterol 200mg , calories 30% from fat and 7% or less from saturated fats, daily to have 5 or more servings of fruits and vegetables.  Biometrics:  Pre Biometrics - 10/21/23 1603       Pre Biometrics   Height 5' 1 (1.549 m)    Weight 129.5 kg    Waist Circumference 43.5 inches    Hip Circumference 60 inches    Waist to Hip Ratio 0.73 %    BMI (Calculated) 53.97    Grip Strength 27.4 kg    Single Leg Stand 4.5 seconds           Nutrition Therapy Plan and Nutrition Goals:   Nutrition Assessments:  MEDIFICTS Score Key: >=70 Need to make dietary changes  40-70 Heart Healthy Diet <= 40 Therapeutic Level Cholesterol Diet  Flowsheet Row CARDIAC REHAB PHASE II ORIENTATION from 10/21/2023 in [facility] CARDIAC REHABILITATION  Picture Your Plate Total Score on Admission 62   Picture Your Plate Scores: <16 Unhealthy dietary pattern with much room for improvement. 41-50 Dietary pattern unlikely to meet recommendations for good health and room for improvement. 51-60 More healthful  dietary pattern, with some room for improvement.  >60 Healthy dietary pattern, although there may be some specific behaviors that could be improved.    Nutrition Goals Re-Evaluation:  Nutrition Goals Re-Evaluation     Row Name 12/03/23 1540             Goals   Nutrition Goal healthy eating          Nutrition Goals Discharge (Final Nutrition Goals Re-Evaluation):  Nutrition Goals Re-Evaluation - 12/03/23 1540       Goals   Nutrition Goal healthy eating          Psychosocial: Target Goals: Acknowledge presence or absence of significant depression and/or stress, maximize coping skills, provide positive support system. Participant is able to verbalize types and ability to use techniques and skills needed for reducing stress and depression.  Initial Review & Psychosocial Screening:  Initial Psych Review & Screening - 10/21/23 1521       Initial Review   Current issues with None Identified      Family Dynamics   Good Support System? Yes      Screening Interventions   Interventions Encouraged to exercise;To provide support and resources with identified psychosocial needs;Provide feedback about the scores to participant    Expected Outcomes Short Term goal: Utilizing psychosocial counselor, staff and physician to assist with identification of specific Stressors or current issues interfering with healing process. Setting desired goal for each stressor or current issue identified.;Long Term Goal: Stressors or current issues are controlled or eliminated.;Short Term goal: Identification and review with participant of any  Quality of Life or Depression concerns found by scoring the questionnaire.;Long Term goal: The participant improves quality of Life and PHQ9 Scores as seen by post scores and/or verbalization of changes          Quality of Life Scores:  Quality of Life - 11/10/23 0928       Quality of Life   Select Quality of Life      Quality of Life Scores    Health/Function Pre 22.63 %    Socioeconomic Pre 23.06 %    Psych/Spiritual Pre 24.86 %    Family Pre 26.4 %    GLOBAL Pre 23.71 %         Scores of 19 and below usually indicate a poorer quality of life in these areas.  A difference of  2-3 points is a clinically meaningful difference.  A difference of 2-3 points in the total score of the Quality of Life Index has been associated with significant improvement in overall quality of life, self-image, physical symptoms, and general health in studies assessing change in quality of life.  PHQ-9: Review Flowsheet       10/21/2023 11/07/2016 11/03/2016  Depression screen PHQ 2/9  Decreased Interest 0 0 0  Down, Depressed, Hopeless 0 0 0  PHQ - 2 Score 0 0 0  Altered sleeping 0 - -  Tired, decreased energy 1 - -  Change in appetite 1 - -  Feeling bad or failure about yourself  0 - -  Trouble concentrating 0 - -  Moving slowly or fidgety/restless 0 - -  Suicidal thoughts 0 - -  PHQ-9 Score 2 - -  Difficult doing work/chores Not difficult at all - -   Interpretation of Total Score  Total Score Depression Severity:  1-4 = Minimal depression, 5-9 = Mild depression, 10-14 = Moderate depression, 15-19 = Moderately severe depression, 20-27 = Severe depression   Psychosocial Evaluation and Intervention:  Psychosocial Evaluation - 10/21/23 1522       Psychosocial Evaluation & Interventions   Interventions Stress management education;Relaxation education;Encouraged to exercise with the program and follow exercise prescription    Comments Patient was referred to CR with NSTEMI. She had no CAD but MI was thought to be caused by influenza type A. 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 Her initial PHQ-9 score was 2 due to lack of energy and poor appetite. She denies any depression, anxiety, or stressors. She says she sleeps well. She says she has great support from her husband, son, her daughter and sister  and nephews. She seems very motivated to participate in the program. Her goals are to be as healthy as she can; to improve her strength and stamina; and to learn how to take care of herself better. She has no barriers identified to complete the program.    Expected Outcomes Short Term: patient will start the program and attend consistently. Long Term: Complete the program meeting her personal goals.    Continue Psychosocial Services  Follow up required by staff          Psychosocial Re-Evaluation:   Psychosocial Discharge (Final Psychosocial Re-Evaluation):   Vocational Rehabilitation: Provide vocational rehab assistance to qualifying candidates.   Vocational Rehab Evaluation & Intervention:  Vocational Rehab - 10/21/23 1521       Initial Vocational Rehab Evaluation & Intervention   Assessment shows need for Vocational Rehabilitation No      Vocational Rehab Re-Evaulation  Comments Patient  has returned to work as a Runner, broadcasting/film/video.          Education: Education Goals: Education classes will be provided on a weekly basis, covering required topics. Participant will state understanding/return demonstration of topics presented.  Learning Barriers/Preferences:  Learning Barriers/Preferences - 10/21/23 1529       Learning Barriers/Preferences   Learning Barriers None    Learning Preferences Audio;Written Material;Skilled Demonstration          Education Topics: Hypertension, Hypertension Reduction -Define heart disease and high blood pressure. Discus how high blood pressure affects the body and ways to reduce high blood pressure.   Exercise and Your Heart -Discuss why it is important to exercise, the FITT principles of exercise, normal and abnormal responses to exercise, and how to exercise safely. Flowsheet Row CARDIAC REHAB PHASE II EXERCISE from 12/31/2023 in [place] CARDIAC REHABILITATION  Date 12/31/23  Educator HB  Instruction Review Code 1- Verbalizes  Understanding    Angina -Discuss definition of angina, causes of angina, treatment of angina, and how to decrease risk of having angina.   Cardiac Medications -Review what the following cardiac medications are used for, how they affect the body, and side effects that may occur when taking the medications.  Medications include Aspirin , Beta blockers, calcium  channel blockers, ACE Inhibitors, angiotensin receptor blockers, diuretics, digoxin, and antihyperlipidemics.   Congestive Heart Failure -Discuss the definition of CHF, how to live with CHF, the signs and symptoms of CHF, and how keep track of weight and sodium intake.   Heart Disease and Intimacy -Discus the effect sexual activity has on the heart, how changes occur during intimacy as we age, and safety during sexual activity.   Smoking Cessation / COPD -Discuss different methods to quit smoking, the health benefits of quitting smoking, and the definition of COPD.   Nutrition I: Fats -Discuss the types of cholesterol, what cholesterol does to the heart, and how cholesterol levels can be controlled.   Nutrition II: Labels -Discuss the different components of food labels and how to read food label   Heart Parts/Heart Disease and PAD -Discuss the anatomy of the heart, the pathway of blood circulation through the heart, and these are affected by heart disease.   Stress I: Signs and Symptoms -Discuss the causes of stress, how stress may lead to anxiety and depression, and ways to limit stress.   Stress II: Relaxation -Discuss different types of relaxation techniques to limit stress.   Warning Signs of Stroke / TIA -Discuss definition of a stroke, what the signs and symptoms are of a stroke, and how to identify when someone is having stroke.   Knowledge Questionnaire Score:  Knowledge Questionnaire Score - 11/10/23 1610       Knowledge Questionnaire Score   Pre Score 25/26          Core Components/Risk  Factors/Patient Goals at Admission:  Personal Goals and Risk Factors at Admission - 10/21/23 1521       Core Components/Risk Factors/Patient Goals on Admission    Weight Management Yes;Obesity;Weight Loss    Intervention Weight Management: Develop a combined nutrition and exercise program designed to reach desired caloric intake, while maintaining appropriate intake of nutrient and fiber, sodium and fats, and appropriate energy expenditure required for the weight goal.;Weight Management/Obesity: Establish reasonable short term and long term weight goals.;Weight Management: Provide education and appropriate resources to help participant work on and attain dietary goals.;Obesity: Provide education and appropriate resources to help participant work on and  attain dietary goals.    Admit Weight 285 lb 8 oz (129.5 kg)    Goal Weight: Short Term 280 lb (127 kg)    Goal Weight: Long Term 275 lb (124.7 kg)    Expected Outcomes Short Term: Continue to assess and modify interventions until short term weight is achieved;Long Term: Adherence to nutrition and physical activity/exercise program aimed toward attainment of established weight goal;Weight Loss: Understanding of general recommendations for a balanced deficit meal plan, which promotes 1-2 lb weight loss per week and includes a negative energy balance of [phone] kcal/d;Understanding recommendations for meals to include 15-35% energy as protein, 25-35% energy from fat, 35-60% energy from carbohydrates, less than 200mg  of dietary cholesterol, 20-35 gm of total fiber daily;Understanding of distribution of calorie intake throughout the day with the consumption of 4-5 meals/snacks    Hypertension Yes    Intervention Provide education on lifestyle modifcations including regular physical activity/exercise, weight management, moderate sodium restriction and increased consumption of fresh fruit, vegetables, and low fat dairy, alcohol moderation, and smoking  cessation.;Monitor prescription use compliance.    Expected Outcomes Short Term: Continued assessment and intervention until BP is < 140/52mm HG in hypertensive participants. < 130/52mm HG in hypertensive participants with diabetes, heart failure or chronic kidney disease.;Long Term: Maintenance of blood pressure at goal levels.    Lipids Yes    Intervention Provide education and support for participant on nutrition & aerobic/resistive exercise along with prescribed medications to achieve LDL 70mg , HDL >40mg .    Expected Outcomes Short Term: Participant states understanding of desired cholesterol values and is compliant with medications prescribed. Participant is following exercise prescription and nutrition guidelines.;Long Term: Cholesterol controlled with medications as prescribed, with individualized exercise RX and with personalized nutrition plan. Value goals: LDL < 70mg , HDL > 40 mg.          Core Components/Risk Factors/Patient Goals Review:    Core Components/Risk Factors/Patient Goals at Discharge (Final Review):    ITP Comments:  ITP Comments     Row Name 10/21/23 1609 11/10/23 1457 11/11/23 1422 12/09/23 1252 01/06/24 1516   ITP Comments Patient arrived for 1st visit/orientation/education at 1400. Patient was referred to CR by Dr. Alda Amas due to NSTEMI. During orientation advised patient on arrival and appointment times what to wear, what to do before, during and after exercise. Reviewed attendance and class policy.  Pt is scheduled to return Cardiac Rehab on 10/27/23 at 1500. Pt was advised to come to class 15 minutes before class starts.  Discussed RPE/Dpysnea scales. Patient participated in warm up stretches. Patient was able to complete 6 minute walk test.  Telemetry:NSR. Patient was measured for the equipment. Discussed equipment safety with patient. Took patient pre-anthropometric measurements. Patient finished visit at 1545. First full day of exercise!  Patient was oriented to  gym and equipment including functions, settings, policies, and procedures.  Patient's individual exercise prescription and treatment plan were reviewed.  All starting workloads were established based on the results of the 6 minute walk test done at initial orientation visit.  The plan for exercise progression was also introduced and progression will be customized based on patient's performance and goals. 30 day review completed. ITP sent to Dr. Armida Lander, Medical Director of Cardiac Rehab. Continue with ITP unless changes are made by physician.  New to program 30 day review completed. ITP sent to Dr. Armida Lander, Medical Director of Cardiac Rehab. Continue with ITP unless changes are made by physician. 30 day review completed. ITP sent to  Dr. Armida Lander, Medical Director of Cardiac Rehab. Continue with ITP unless changes are made by physician. Breta has been out with work and only attend last week for first time, unable to assess for goals.    Row Name 02/03/24 1154 03/02/24 1109         ITP Comments 30 day review completed. ITP sent to Dr. Armida Lander, Medical Director of Cardiac Rehab. Continue with ITP unless changes are made by physician. Madaline continues to be out with work  last attended on 12/31/23, unable to assess for goals. 30 day review completed. ITP sent to Dr. Armida Lander, Medical Director of Cardiac Rehab. Continue with ITP unless changes are made by physician. Keshana continues to be out with work  last attended on 12/31/23, unable to assess for goals.         Comments: 30 Day Review

## 2024-03-30 ENCOUNTER — Encounter (HOSPITAL_COMMUNITY): Payer: Self-pay | Admitting: *Deleted

## 2024-03-30 DIAGNOSIS — I214 Non-ST elevation (NSTEMI) myocardial infarction: Secondary | ICD-10-CM

## 2024-03-30 NOTE — Progress Notes (Signed)
 Cardiac Individual Treatment Plan  Patient Details  Name: Bonnie Martin MRN: [id number] Date of Birth: 02-02-1961 Referring Provider:   Flowsheet Row CARDIAC REHAB PHASE II ORIENTATION from 10/21/2023 in [facility] CARDIAC REHABILITATION  Referring Provider Kate Bruckner MD    Initial Encounter Date:  Flowsheet Row CARDIAC REHAB PHASE II ORIENTATION from 10/21/2023 in [place] CARDIAC REHABILITATION  Date 10/21/23    Visit Diagnosis: NSTEMI (non-ST elevated myocardial infarction) [facility])  Patient's Home Medications on Admission:  Current Outpatient Medications:    Acetaminophen  Extra Strength 500 MG TABS, Take 500-1,000 mg by mouth 3 (three) times daily as needed (Pain)., Disp: , Rfl:    allopurinol  (ZYLOPRIM ) 300 MG tablet, Take 300 mg by mouth every evening., Disp: , Rfl:    amLODipine  (NORVASC ) 10 MG tablet, Take 1 tablet by mouth daily., Disp: , Rfl:    aspirin  EC 81 MG tablet, Take 1 tablet (81 mg total) by mouth daily. Swallow whole., Disp: , Rfl:    clobetasol ointment (TEMOVATE) 0.05 %, daily., Disp: , Rfl:    Clobetasol Propionate 0.05 % shampoo, Apply topically every 14 (fourteen) days., Disp: , Rfl:    colchicine 0.6 MG tablet, Take 0.6 mg by mouth daily as needed (Gout)., Disp: , Rfl:    Evolocumab  (REPATHA  SURECLICK) 140 MG/ML SOAJ, Inject 140 mg into the skin every 14 (fourteen) days., Disp: 6 mL, Rfl: 3   finasteride (PROSCAR) 5 MG tablet, 2.5 mg daily., Disp: , Rfl:    fluticasone (FLONASE) 50 MCG/ACT nasal spray, Place 1 spray into both nostrils daily as needed for allergies or rhinitis., Disp: , Rfl:    furosemide  (LASIX ) 40 MG tablet, Take 1.5 tablets (60 mg total) by mouth daily., Disp: 135 tablet, Rfl: 3   halobetasol (ULTRAVATE) 0.05 % cream, Apply 1 Application topically daily., Disp: , Rfl:    hydrocortisone (ANUSOL-HC) 2.5 % rectal cream, Place 1 Application rectally 2 (two) times daily as needed for hemorrhoids or anal itching., Disp: , Rfl:     levothyroxine  (SYNTHROID ) 112 MCG tablet, Take by mouth., Disp: , Rfl:    metoprolol  (TOPROL -XL) 200 MG 24 hr tablet, Take 200 mg by mouth daily., Disp: , Rfl:    nitroGLYCERIN  (NITROSTAT ) 0.4 MG SL tablet, Place 1 tablet (0.4 mg total) under the tongue every 5 (five) minutes x 3 doses as needed for chest pain., Disp: 25 tablet, Rfl: 1   omeprazole  (PRILOSEC) 40 MG capsule, Take 40 mg by mouth daily as needed (Indigestion)., Disp: , Rfl:    valsartan (DIOVAN) 320 MG tablet, Take 320 mg by mouth daily., Disp: , Rfl:   Past Medical History: Past Medical History:  Diagnosis Date   Hypertension    Thyroid disease     Tobacco Use: Social History   Tobacco Use  Smoking Status Never  Smokeless Tobacco Never    Labs: Review Flowsheet       Latest Ref Rng & Units 09/22/2023  Labs for ITP Cardiac and Pulmonary Rehab  Cholestrol 0 - 200 mg/dL 795   LDL (calc) 0 - 99 mg/dL 882   HDL-C >59 mg/dL 65   Trlycerides <849 mg/dL 887   Hemoglobin J8r 4.8 - 5.6 % 5.7     Capillary Blood Glucose: No results found for: GLUCAP   Exercise Target Goals: Exercise Program Goal: Individual exercise prescription set using results from initial 6 min walk test and THRR while considering  patient's activity barriers and safety.   Exercise Prescription Goal: Starting with aerobic activity  30 plus minutes a day, 3 days per week for initial exercise prescription. Provide home exercise prescription and guidelines that participant acknowledges understanding prior to discharge.  Activity Barriers & Risk Stratification:  Activity Barriers & Cardiac Risk Stratification - 10/21/23 1442       Activity Barriers & Cardiac Risk Stratification   Activity Barriers Arthritis    Cardiac Risk Stratification Moderate          6 Minute Walk:  6 Minute Walk     Row Name 10/21/23 1556         6 Minute Walk   Phase Initial     Distance 600 feet     Walk Time 4.77 minutes     # of Rest Breaks 1  1:14      MPH 1.43     METS 1.04     RPE 13     Perceived Dyspnea  1     VO2 Peak 3.67     Symptoms Yes (comment)     Comments R knee pain 5/10     Resting HR 72 bpm     Resting BP 100/60     Resting Oxygen Saturation  100 %     Exercise Oxygen Saturation  during 6 min walk 97 %     Max Ex. HR 119 bpm     Max Ex. BP 134/72     2 Minute Post BP 120/60        Oxygen Initial Assessment:   Oxygen Re-Evaluation:   Oxygen Discharge (Final Oxygen Re-Evaluation):   Initial Exercise Prescription:  Initial Exercise Prescription - 10/21/23 1500       Date of Initial Exercise RX and Referring Provider   Date 10/21/23    Referring Provider Kate Bruckner MD      Oxygen   Maintain Oxygen Saturation 88% or higher      Treadmill   MPH 1.2    Grade 0    Minutes 15    METs 1.92      NuStep   Level 2    SPM 80    Minutes 15    METs 2      Prescription Details   Frequency (times per week) 2    Duration Progress to 30 minutes of continuous aerobic without signs/symptoms of physical distress      Intensity   THRR 40-80% of Max Heartrate 107-142    Ratings of Perceived Exertion 11-13    Perceived Dyspnea 0-4      Progression   Progression Continue to progress workloads to maintain intensity without signs/symptoms of physical distress.      Resistance Training   Training Prescription Yes    Weight 3 lb    Reps 10-15          Perform Capillary Blood Glucose checks as needed.  Exercise Prescription Changes:   Exercise Prescription Changes     Row Name 10/21/23 1500 12/31/23 1500           Response to Exercise   Blood Pressure (Admit) 100/60 126/68      Blood Pressure (Exercise) 134/72 142/90      Blood Pressure (Exit) 120/60 122/80      Heart Rate (Admit) 72 bpm 65 bpm      Heart Rate (Exercise) 119 bpm 92 bpm      Heart Rate (Exit) 96 bpm 68 bpm      Oxygen Saturation (Admit) 100 % --      Oxygen Saturation (  Exercise) 97 % --      Rating of Perceived  Exertion (Exercise) 13 14      Perceived Dyspnea (Exercise) 1 1      Symptoms R knee pain 5/10 --      Comments walk test results --      Duration -- Continue with 30 min of aerobic exercise without signs/symptoms of physical distress.      Intensity -- THRR unchanged        Progression   Progression -- Continue to progress workloads to maintain intensity without signs/symptoms of physical distress.        Resistance Training   Training Prescription -- Yes      Weight -- 3      Reps -- 10-15        NuStep   Level -- 3      SPM -- 86      Minutes -- 15      METs -- 2.2        Arm Ergometer   Level -- 3      Watts -- 52      Minutes -- 15      METs -- 1.9         Exercise Comments:   Exercise Comments     Row Name 11/10/23 1457           Exercise Comments First full day of exercise!  Patient was oriented to gym and equipment including functions, settings, policies, and procedures.  Patient's individual exercise prescription and treatment plan were reviewed.  All starting workloads were established based on the results of the 6 minute walk test done at initial orientation visit.  The plan for exercise progression was also introduced and progression will be customized based on patient's performance and goals.          Exercise Goals and Review:   Exercise Goals     Row Name 10/21/23 1602             Exercise Goals   Increase Physical Activity Yes       Intervention Provide advice, education, support and counseling about physical activity/exercise needs.;Develop an individualized exercise prescription for aerobic and resistive training based on initial evaluation findings, risk stratification, comorbidities and participant's personal goals.       Expected Outcomes Short Term: Attend rehab on a regular basis to increase amount of physical activity.;Long Term: Add in home exercise to make exercise part of routine and to increase amount of physical activity.;Long Term:  Exercising regularly at least 3-5 days a week.       Increase Strength and Stamina Yes       Intervention Provide advice, education, support and counseling about physical activity/exercise needs.;Develop an individualized exercise prescription for aerobic and resistive training based on initial evaluation findings, risk stratification, comorbidities and participant's personal goals.       Expected Outcomes Short Term: Increase workloads from initial exercise prescription for resistance, speed, and METs.;Short Term: Perform resistance training exercises routinely during rehab and add in resistance training at home;Long Term: Improve cardiorespiratory fitness, muscular endurance and strength as measured by increased METs and functional capacity ( )       Able to understand and use rate of perceived exertion (RPE) scale Yes       Intervention Provide education and explanation on how to use RPE scale       Expected Outcomes Short Term: Able to use RPE daily in rehab to express subjective  intensity level;Long Term:  Able to use RPE to guide intensity level when exercising independently       Able to understand and use Dyspnea scale Yes       Intervention Provide education and explanation on how to use Dyspnea scale       Expected Outcomes Short Term: Able to use Dyspnea scale daily in rehab to express subjective sense of shortness of breath during exertion;Long Term: Able to use Dyspnea scale to guide intensity level when exercising independently       Knowledge and understanding of Target Heart Rate Range (THRR) Yes       Intervention Provide education and explanation of THRR including how the numbers were predicted and where they are located for reference       Expected Outcomes Short Term: Able to state/look up THRR;Long Term: Able to use THRR to govern intensity when exercising independently;Short Term: Able to use daily as guideline for intensity in rehab       Able to check pulse independently Yes        Intervention Provide education and demonstration on how to check pulse in carotid and radial arteries.;Review the importance of being able to check your own pulse for safety during independent exercise       Expected Outcomes Short Term: Able to explain why pulse checking is important during independent exercise;Long Term: Able to check pulse independently and accurately       Understanding of Exercise Prescription Yes       Intervention Provide education, explanation, and written materials on patient's individual exercise prescription       Expected Outcomes Short Term: Able to explain program exercise prescription;Long Term: Able to explain home exercise prescription to exercise independently          Exercise Goals Re-Evaluation :  Exercise Goals Re-Evaluation     Row Name 11/10/23 1458 12/03/23 1536           Exercise Goal Re-Evaluation   Exercise Goals Review Able to understand and use rate of perceived exertion (RPE) scale;Knowledge and understanding of Target Heart Rate Range (THRR);Able to understand and use Dyspnea scale;Understanding of Exercise Prescription Increase Physical Activity;Increase Strength and Stamina;Understanding of Exercise Prescription      Comments Reviewed RPE and dyspnea scale, THR and program prescription with pt today.  Pt voiced understanding and was given a copy of goals to take home. Issac is doing well in rehab. She has noticed a slight increase in her endurance since starting the program. She has not been able to come to some classes due to work meeting but is trying. She wants to start walking since the weather is getting nice and hopes it also helps her knee      Expected Outcomes Short: Use RPE daily to regulate intensity.  Long: Follow program prescription in THR. continue to come tp rehab   add exericse outside of rehab to help with endurance          Discharge Exercise Prescription (Final Exercise Prescription Changes):  Exercise Prescription  Changes - 12/31/23 1500       Response to Exercise   Blood Pressure (Admit) 126/68    Blood Pressure (Exercise) 142/90    Blood Pressure (Exit) 122/80    Heart Rate (Admit) 65 bpm    Heart Rate (Exercise) 92 bpm    Heart Rate (Exit) 68 bpm    Rating of Perceived Exertion (Exercise) 14    Perceived Dyspnea (Exercise) 1  Duration Continue with 30 min of aerobic exercise without signs/symptoms of physical distress.    Intensity THRR unchanged      Progression   Progression Continue to progress workloads to maintain intensity without signs/symptoms of physical distress.      Resistance Training   Training Prescription Yes    Weight 3    Reps 10-15      NuStep   Level 3    SPM 86    Minutes 15    METs 2.2      Arm Ergometer   Level 3    Watts 52    Minutes 15    METs 1.9          Nutrition:  Target Goals: Understanding of nutrition guidelines, daily intake of sodium 1500mg , cholesterol 200mg , calories 30% from fat and 7% or less from saturated fats, daily to have 5 or more servings of fruits and vegetables.  Biometrics:  Pre Biometrics - 10/21/23 1603       Pre Biometrics   Height 5' 1 (1.549 m)    Weight 285 lb 8 oz (129.5 kg)    Waist Circumference 43.5 inches    Hip Circumference 60 inches    Waist to Hip Ratio 0.73 %    BMI (Calculated) 53.97    Grip Strength 27.4 kg    Single Leg Stand 4.5 seconds           Nutrition Therapy Plan and Nutrition Goals:   Nutrition Assessments:  MEDIFICTS Score Key: >=70 Need to make dietary changes  40-70 Heart Healthy Diet <= 40 Therapeutic Level Cholesterol Diet  Flowsheet Row CARDIAC REHAB PHASE II ORIENTATION from 10/21/2023 in [facility] CARDIAC REHABILITATION  Picture Your Plate Total Score on Admission 62   Picture Your Plate Scores: <59 Unhealthy dietary pattern with much room for improvement. 41-50 Dietary pattern unlikely to meet recommendations for good health and room for improvement. 51-60  More healthful dietary pattern, with some room for improvement.  >60 Healthy dietary pattern, although there may be some specific behaviors that could be improved.    Nutrition Goals Re-Evaluation:  Nutrition Goals Re-Evaluation     Row Name 12/03/23 1540             Goals   Nutrition Goal healthy eating          Nutrition Goals Discharge (Final Nutrition Goals Re-Evaluation):  Nutrition Goals Re-Evaluation - 12/03/23 1540       Goals   Nutrition Goal healthy eating          Psychosocial: Target Goals: Acknowledge presence or absence of significant depression and/or stress, maximize coping skills, provide positive support system. Participant is able to verbalize types and ability to use techniques and skills needed for reducing stress and depression.  Initial Review & Psychosocial Screening:  Initial Psych Review & Screening - 10/21/23 1521       Initial Review   Current issues with None Identified      Family Dynamics   Good Support System? Yes      Screening Interventions   Interventions Encouraged to exercise;To provide support and resources with identified psychosocial needs;Provide feedback about the scores to participant    Expected Outcomes Short Term goal: Utilizing psychosocial counselor, staff and physician to assist with identification of specific Stressors or current issues interfering with healing process. Setting desired goal for each stressor or current issue identified.;Long Term Goal: Stressors or current issues are controlled or eliminated.;Short Term goal: Identification and review  with participant of any Quality of Life or Depression concerns found by scoring the questionnaire.;Long Term goal: The participant improves quality of Life and PHQ9 Scores as seen by post scores and/or verbalization of changes          Quality of Life Scores:  Quality of Life - 11/10/23 0928       Quality of Life   Select Quality of Life      Quality of Life  Scores   Health/Function Pre 22.63 %    Socioeconomic Pre 23.06 %    Psych/Spiritual Pre 24.86 %    Family Pre 26.4 %    GLOBAL Pre 23.71 %         Scores of 19 and below usually indicate a poorer quality of life in these areas.  A difference of  2-3 points is a clinically meaningful difference.  A difference of 2-3 points in the total score of the Quality of Life Index has been associated with significant improvement in overall quality of life, self-image, physical symptoms, and general health in studies assessing change in quality of life.  PHQ-9: Review Flowsheet       10/21/2023 11/07/2016 11/03/2016  Depression screen PHQ 2/9  Decreased Interest 0 0 0  Down, Depressed, Hopeless 0 0 0  PHQ - 2 Score 0 0 0  Altered sleeping 0 - -  Tired, decreased energy 1 - -  Change in appetite 1 - -  Feeling bad or failure about yourself  0 - -  Trouble concentrating 0 - -  Moving slowly or fidgety/restless 0 - -  Suicidal thoughts 0 - -  PHQ-9 Score 2 - -  Difficult doing work/chores Not difficult at all - -   Interpretation of Total Score  Total Score Depression Severity:  1-4 = Minimal depression, 5-9 = Mild depression, 10-14 = Moderate depression, 15-19 = Moderately severe depression, 20-27 = Severe depression   Psychosocial Evaluation and Intervention:  Psychosocial Evaluation - 10/21/23 1522       Psychosocial Evaluation & Interventions   Interventions Stress management education;Relaxation education;Encouraged to exercise with the program and follow exercise prescription    Comments Patient was referred to CR with NSTEMI. She had no CAD but MI was thought to be caused by influenza type A. 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 Her initial PHQ-9 score was 2 due to lack of energy and poor appetite. She denies any depression, anxiety, or stressors. She says she sleeps well. She says she has great support from her husband, son, her daughter and  sister and nephews. She seems very motivated to participate in the program. Her goals are to be as healthy as she can; to improve her strength and stamina; and to learn how to take care of herself better. She has no barriers identified to complete the program.    Expected Outcomes Short Term: patient will start the program and attend consistently. Long Term: Complete the program meeting her personal goals.    Continue Psychosocial Services  Follow up required by staff          Psychosocial Re-Evaluation:   Psychosocial Discharge (Final Psychosocial Re-Evaluation):   Vocational Rehabilitation: Provide vocational rehab assistance to qualifying candidates.   Vocational Rehab Evaluation & Intervention:  Vocational Rehab - 10/21/23 1521       Initial Vocational Rehab Evaluation & Intervention   Assessment shows need for Vocational Rehabilitation No      Vocational  Rehab Re-Evaulation   Comments Patient  has returned to work as a Runner, broadcasting/film/video.          Education: Education Goals: Education classes will be provided on a weekly basis, covering required topics. Participant will state understanding/return demonstration of topics presented.  Learning Barriers/Preferences:  Learning Barriers/Preferences - 10/21/23 1529       Learning Barriers/Preferences   Learning Barriers None    Learning Preferences Audio;Written Material;Skilled Demonstration          Education Topics: Hypertension, Hypertension Reduction -Define heart disease and high blood pressure. Discus how high blood pressure affects the body and ways to reduce high blood pressure.   Exercise and Your Heart -Discuss why it is important to exercise, the FITT principles of exercise, normal and abnormal responses to exercise, and how to exercise safely. Flowsheet Row CARDIAC REHAB PHASE II EXERCISE from 12/31/2023 in [place] CARDIAC REHABILITATION  Date 12/31/23  Educator HB  Instruction Review Code 1- Verbalizes  Understanding    Angina -Discuss definition of angina, causes of angina, treatment of angina, and how to decrease risk of having angina.   Cardiac Medications -Review what the following cardiac medications are used for, how they affect the body, and side effects that may occur when taking the medications.  Medications include Aspirin , Beta blockers, calcium  channel blockers, ACE Inhibitors, angiotensin receptor blockers, diuretics, digoxin, and antihyperlipidemics.   Congestive Heart Failure -Discuss the definition of CHF, how to live with CHF, the signs and symptoms of CHF, and how keep track of weight and sodium intake.   Heart Disease and Intimacy -Discus the effect sexual activity has on the heart, how changes occur during intimacy as we age, and safety during sexual activity.   Smoking Cessation / COPD -Discuss different methods to quit smoking, the health benefits of quitting smoking, and the definition of COPD.   Nutrition I: Fats -Discuss the types of cholesterol, what cholesterol does to the heart, and how cholesterol levels can be controlled.   Nutrition II: Labels -Discuss the different components of food labels and how to read food label   Heart Parts/Heart Disease and PAD -Discuss the anatomy of the heart, the pathway of blood circulation through the heart, and these are affected by heart disease.   Stress I: Signs and Symptoms -Discuss the causes of stress, how stress may lead to anxiety and depression, and ways to limit stress.   Stress II: Relaxation -Discuss different types of relaxation techniques to limit stress.   Warning Signs of Stroke / TIA -Discuss definition of a stroke, what the signs and symptoms are of a stroke, and how to identify when someone is having stroke.   Knowledge Questionnaire Score:  Knowledge Questionnaire Score - 11/10/23 9070       Knowledge Questionnaire Score   Pre Score 25/26          Core Components/Risk  Factors/Patient Goals at Admission:  Personal Goals and Risk Factors at Admission - 10/21/23 1521       Core Components/Risk Factors/Patient Goals on Admission    Weight Management Yes;Obesity;Weight Loss    Intervention Weight Management: Develop a combined nutrition and exercise program designed to reach desired caloric intake, while maintaining appropriate intake of nutrient and fiber, sodium and fats, and appropriate energy expenditure required for the weight goal.;Weight Management/Obesity: Establish reasonable short term and long term weight goals.;Weight Management: Provide education and appropriate resources to help participant work on and attain dietary goals.;Obesity: Provide education and appropriate resources to help  participant work on and attain dietary goals.    Admit Weight 285 lb 8 oz (129.5 kg)    Goal Weight: Short Term 280 lb (127 kg)    Goal Weight: Long Term 275 lb (124.7 kg)    Expected Outcomes Short Term: Continue to assess and modify interventions until short term weight is achieved;Long Term: Adherence to nutrition and physical activity/exercise program aimed toward attainment of established weight goal;Weight Loss: Understanding of general recommendations for a balanced deficit meal plan, which promotes 1-2 lb weight loss per week and includes a negative energy balance of [phone] kcal/d;Understanding recommendations for meals to include 15-35% energy as protein, 25-35% energy from fat, 35-60% energy from carbohydrates, less than 200mg  of dietary cholesterol, 20-35 gm of total fiber daily;Understanding of distribution of calorie intake throughout the day with the consumption of 4-5 meals/snacks    Hypertension Yes    Intervention Provide education on lifestyle modifcations including regular physical activity/exercise, weight management, moderate sodium restriction and increased consumption of fresh fruit, vegetables, and low fat dairy, alcohol moderation, and smoking  cessation.;Monitor prescription use compliance.    Expected Outcomes Short Term: Continued assessment and intervention until BP is < 140/30mm HG in hypertensive participants. < 130/35mm HG in hypertensive participants with diabetes, heart failure or chronic kidney disease.;Long Term: Maintenance of blood pressure at goal levels.    Lipids Yes    Intervention Provide education and support for participant on nutrition & aerobic/resistive exercise along with prescribed medications to achieve LDL 70mg , HDL >40mg .    Expected Outcomes Short Term: Participant states understanding of desired cholesterol values and is compliant with medications prescribed. Participant is following exercise prescription and nutrition guidelines.;Long Term: Cholesterol controlled with medications as prescribed, with individualized exercise RX and with personalized nutrition plan. Value goals: LDL < 70mg , HDL > 40 mg.          Core Components/Risk Factors/Patient Goals Review:    Core Components/Risk Factors/Patient Goals at Discharge (Final Review):    ITP Comments:  ITP Comments     Row Name 10/21/23 1609 11/10/23 1457 11/11/23 1422 12/09/23 1252 01/06/24 1516   ITP Comments Patient arrived for 1st visit/orientation/education at 1400. Patient was referred to CR by Dr. Kate due to NSTEMI. During orientation advised patient on arrival and appointment times what to wear, what to do before, during and after exercise. Reviewed attendance and class policy.  Pt is scheduled to return Cardiac Rehab on 10/27/23 at 1500. Pt was advised to come to class 15 minutes before class starts.  Discussed RPE/Dpysnea scales. Patient participated in warm up stretches. Patient was able to complete 6 minute walk test.  Telemetry:NSR. Patient was measured for the equipment. Discussed equipment safety with patient. Took patient pre-anthropometric measurements. Patient finished visit at 1545. First full day of exercise!  Patient was oriented to  gym and equipment including functions, settings, policies, and procedures.  Patient's individual exercise prescription and treatment plan were reviewed.  All starting workloads were established based on the results of the 6 minute walk test done at initial orientation visit.  The plan for exercise progression was also introduced and progression will be customized based on patient's performance and goals. 30 day review completed. ITP sent to Dr. Dorn Ross, Medical Director of Cardiac Rehab. Continue with ITP unless changes are made by physician.  New to program 30 day review completed. ITP sent to Dr. Dorn Ross, Medical Director of Cardiac Rehab. Continue with ITP unless changes are made by physician. 30 day review  completed. ITP sent to Dr. Dorn Ross, Medical Director of Cardiac Rehab. Continue with ITP unless changes are made by physician. Simara has been out with work and only attend last week for first time, unable to assess for goals.    Row Name 02/03/24 1154 03/02/24 1109 03/30/24 0952       ITP Comments 30 day review completed. ITP sent to Dr. Dorn Ross, Medical Director of Cardiac Rehab. Continue with ITP unless changes are made by physician. Gwenneth continues to be out with work  last attended on 12/31/23, unable to assess for goals. 30 day review completed. ITP sent to Dr. Dorn Ross, Medical Director of Cardiac Rehab. Continue with ITP unless changes are made by physician. Deaundra continues to be out with work  last attended on 12/31/23, unable to assess for goals. 30 day review completed. ITP sent to Dr. Dorn Ross, Medical Director of Cardiac Rehab. Continue with ITP unless changes are made by physician. Zhana continues to be out with work  last attended on 12/31/23, unable to assess for goals.  Has not returned our calls.        Comments: 30 day review

## 2024-04-14 ENCOUNTER — Telehealth (HOSPITAL_COMMUNITY): Payer: Self-pay | Admitting: *Deleted

## 2024-04-14 ENCOUNTER — Encounter (HOSPITAL_COMMUNITY): Payer: Self-pay | Admitting: *Deleted

## 2024-04-14 DIAGNOSIS — I214 Non-ST elevation (NSTEMI) myocardial infarction: Secondary | ICD-10-CM

## 2024-04-14 NOTE — Telephone Encounter (Signed)
 Patient called in response to letter that was sent about resuming cardiac rehab. She plans to return to the program next Thursday 8/7 and try attending on Thursdays and Fridays to finish.

## 2024-04-21 ENCOUNTER — Encounter (HOSPITAL_COMMUNITY)

## 2024-04-22 ENCOUNTER — Encounter (HOSPITAL_COMMUNITY)

## 2024-04-26 ENCOUNTER — Encounter (HOSPITAL_COMMUNITY)
Admission: RE | Admit: 2024-04-26 | Discharge: 2024-04-26 | Disposition: A | Discharge status: Home / Self Care | Source: Ambulatory Visit | Attending: Internal Medicine | Admitting: Internal Medicine

## 2024-04-26 DIAGNOSIS — I214 Non-ST elevation (NSTEMI) myocardial infarction: Secondary | ICD-10-CM | POA: Insufficient documentation

## 2024-04-26 NOTE — Progress Notes (Signed)
 Daily Session Note  Patient Details  Name: Bonnie Martin MRN: [id number] Date of Birth: Dec 10, 1960 Referring Provider:   Flowsheet Row CARDIAC REHAB PHASE II ORIENTATION from 10/21/2023 in [facility] CARDIAC REHABILITATION  Referring Provider Kate Bruckner MD    Encounter Date: 04/26/2024  Check In:   Capillary Blood Glucose: No results found for this or any previous visit (from the past 24 hours).    Social History   Tobacco Use  Smoking Status Never  Smokeless Tobacco Never    Goals Met:  Independence with exercise equipment Exercise tolerated well No report of concerns or symptoms today Strength training completed today  Goals Unmet:  Not Applicable  Comments: Pt able to follow exercise prescription today without complaint.  Will continue to monitor for progression.

## 2024-04-27 ENCOUNTER — Encounter (HOSPITAL_COMMUNITY): Payer: Self-pay | Admitting: *Deleted

## 2024-04-27 DIAGNOSIS — I214 Non-ST elevation (NSTEMI) myocardial infarction: Secondary | ICD-10-CM

## 2024-04-27 NOTE — Progress Notes (Signed)
 Cardiac Individual Treatment Plan  Bonnie Martin Details  Name: Bonnie Martin MRN: [id number] Date of Birth: Jan 07, 1961 Referring Provider:   Flowsheet Row CARDIAC REHAB PHASE II ORIENTATION from 10/21/2023 in [facility] CARDIAC REHABILITATION  Referring Provider Kate Bruckner MD    Initial Encounter Date:  Flowsheet Row CARDIAC REHAB PHASE II ORIENTATION from 10/21/2023 in [place] CARDIAC REHABILITATION  Date 10/21/23    Visit Diagnosis: NSTEMI (non-ST elevated myocardial infarction) [facility])  Bonnie Martin's Home Medications on Admission:  Current Outpatient Medications:    Acetaminophen  Extra Strength 500 MG TABS, Take 500-1,000 mg by mouth 3 (three) times daily as needed (Pain)., Disp: , Rfl:    allopurinol  (ZYLOPRIM ) 300 MG tablet, Take 300 mg by mouth every evening., Disp: , Rfl:    amLODipine  (NORVASC ) 10 MG tablet, Take 1 tablet by mouth daily., Disp: , Rfl:    aspirin  EC 81 MG tablet, Take 1 tablet (81 mg total) by mouth daily. Swallow whole., Disp: , Rfl:    clobetasol ointment (TEMOVATE) 0.05 %, daily., Disp: , Rfl:    Clobetasol Propionate 0.05 % shampoo, Apply topically every 14 (fourteen) days., Disp: , Rfl:    colchicine 0.6 MG tablet, Take 0.6 mg by mouth daily as needed (Gout)., Disp: , Rfl:    Evolocumab  (REPATHA  SURECLICK) 140 MG/ML SOAJ, Inject 140 mg into the skin every 14 (fourteen) days., Disp: 6 mL, Rfl: 3   finasteride (PROSCAR) 5 MG tablet, 2.5 mg daily., Disp: , Rfl:    fluticasone (FLONASE) 50 MCG/ACT nasal spray, Place 1 spray into both nostrils daily as needed for allergies or rhinitis., Disp: , Rfl:    furosemide  (LASIX ) 40 MG tablet, Take 1.5 tablets (60 mg total) by mouth daily., Disp: 135 tablet, Rfl: 3   halobetasol (ULTRAVATE) 0.05 % cream, Apply 1 Application topically daily., Disp: , Rfl:    hydrocortisone (ANUSOL-HC) 2.5 % rectal cream, Place 1 Application rectally 2 (two) times daily as needed for hemorrhoids or anal itching., Disp: , Rfl:     levothyroxine  (SYNTHROID ) 112 MCG tablet, Take by mouth., Disp: , Rfl:    metoprolol  (TOPROL -XL) 200 MG 24 hr tablet, Take 200 mg by mouth daily., Disp: , Rfl:    nitroGLYCERIN  (NITROSTAT ) 0.4 MG SL tablet, Place 1 tablet (0.4 mg total) under the tongue every 5 (five) minutes x 3 doses as needed for chest pain., Disp: 25 tablet, Rfl: 1   omeprazole  (PRILOSEC) 40 MG capsule, Take 40 mg by mouth daily as needed (Indigestion)., Disp: , Rfl:    valsartan (DIOVAN) 320 MG tablet, Take 320 mg by mouth daily., Disp: , Rfl:   Past Medical History: Past Medical History:  Diagnosis Date   Hypertension    Thyroid disease     Tobacco Use: Social History   Tobacco Use  Smoking Status Never  Smokeless Tobacco Never    Labs: Review Flowsheet       Latest Ref Rng & Units 09/22/2023  Labs for ITP Cardiac and Pulmonary Rehab  Cholestrol 0 - 200 mg/dL 795   LDL (calc) 0 - 99 mg/dL 882   HDL-C >59 mg/dL 65   Trlycerides <849 mg/dL 887   Hemoglobin J8r 4.8 - 5.6 % 5.7     Capillary Blood Glucose: No results found for: GLUCAP   Exercise Target Goals: Exercise Program Goal: Individual exercise prescription set using results from initial 6 min walk test and THRR while considering  Bonnie Martin's activity barriers and safety.   Exercise Prescription Goal: Starting with aerobic activity  30 plus minutes a day, 3 days per week for initial exercise prescription. Provide home exercise prescription and guidelines that participant acknowledges understanding prior to discharge.  Activity Barriers & Risk Stratification:   6 Minute Walk:   Oxygen Initial Assessment:   Oxygen Re-Evaluation:   Oxygen Discharge (Final Oxygen Re-Evaluation):   Initial Exercise Prescription:   Perform Capillary Blood Glucose checks as needed.  Exercise Prescription Changes:   Exercise Prescription Changes     Row Name 12/31/23 1500             Response to Exercise   Blood Pressure (Admit) 126/68        Blood Pressure (Exercise) 142/90       Blood Pressure (Exit) 122/80       Heart Rate (Admit) 65 bpm       Heart Rate (Exercise) 92 bpm       Heart Rate (Exit) 68 bpm       Rating of Perceived Exertion (Exercise) 14       Perceived Dyspnea (Exercise) 1       Duration Continue with 30 min of aerobic exercise without signs/symptoms of physical distress.       Intensity THRR unchanged         Progression   Progression Continue to progress workloads to maintain intensity without signs/symptoms of physical distress.         Resistance Training   Training Prescription Yes       Weight 3       Reps 10-15         NuStep   Level 3       SPM 86       Minutes 15       METs 2.2         Arm Ergometer   Level 3       Watts 52       Minutes 15       METs 1.9          Exercise Comments:   Exercise Comments     Row Name 11/10/23 1457           Exercise Comments First full day of exercise!  Bonnie Martin was oriented to gym and equipment including functions, settings, policies, and procedures.  Bonnie Martin's individual exercise prescription and treatment plan were reviewed.  All starting workloads were established based on the results of the 6 minute walk test done at initial orientation visit.  The plan for exercise progression was also introduced and progression will be customized based on Bonnie Martin's performance and goals.          Exercise Goals and Review:   Exercise Goals Re-Evaluation :  Exercise Goals Re-Evaluation     Row Name 11/10/23 1458 12/03/23 1536           Exercise Goal Re-Evaluation   Exercise Goals Review Able to understand and use rate of perceived exertion (RPE) scale;Knowledge and understanding of Target Heart Rate Range (THRR);Able to understand and use Dyspnea scale;Understanding of Exercise Prescription Increase Physical Activity;Increase Strength and Stamina;Understanding of Exercise Prescription      Comments Reviewed RPE and dyspnea scale, THR and program  prescription with Bonnie Martin.  Bonnie Martin voiced understanding and was given a copy of goals to take home. Bonnie Martin is doing well in rehab. Bonnie Martin has noticed a slight increase in her endurance since starting the program. Bonnie Martin has not been able to come to some classes due to work meeting but is  trying. Bonnie Martin wants to start walking since the weather is getting nice and hopes it also helps her knee      Expected Outcomes Short: Use RPE daily to regulate intensity.  Long: Follow program prescription in THR. continue to come tp rehab   add exericse outside of rehab to help with endurance          Discharge Exercise Prescription (Final Exercise Prescription Changes):  Exercise Prescription Changes - 12/31/23 1500       Response to Exercise   Blood Pressure (Admit) 126/68    Blood Pressure (Exercise) 142/90    Blood Pressure (Exit) 122/80    Heart Rate (Admit) 65 bpm    Heart Rate (Exercise) 92 bpm    Heart Rate (Exit) 68 bpm    Rating of Perceived Exertion (Exercise) 14    Perceived Dyspnea (Exercise) 1    Duration Continue with 30 min of aerobic exercise without signs/symptoms of physical distress.    Intensity THRR unchanged      Progression   Progression Continue to progress workloads to maintain intensity without signs/symptoms of physical distress.      Resistance Training   Training Prescription Yes    Weight 3    Reps 10-15      NuStep   Level 3    SPM 86    Minutes 15    METs 2.2      Arm Ergometer   Level 3    Watts 52    Minutes 15    METs 1.9          Nutrition:  Target Goals: Understanding of nutrition guidelines, daily intake of sodium 1500mg , cholesterol 200mg , calories 30% from fat and 7% or less from saturated fats, daily to have 5 or more servings of fruits and vegetables.  Biometrics:    Nutrition Therapy Plan and Nutrition Goals:   Nutrition Assessments:  MEDIFICTS Score Key: >=70 Need to make dietary changes  40-70 Heart Healthy Diet <= 40 Therapeutic Level  Cholesterol Diet  Flowsheet Row CARDIAC REHAB PHASE II ORIENTATION from 10/21/2023 in [facility] CARDIAC REHABILITATION  Picture Your Plate Total Score on Admission 62   Picture Your Plate Scores: <59 Unhealthy dietary pattern with much room for improvement. 41-50 Dietary pattern unlikely to meet recommendations for good health and room for improvement. 51-60 More healthful dietary pattern, with some room for improvement.  >60 Healthy dietary pattern, although there may be some specific behaviors that could be improved.    Nutrition Goals Re-Evaluation:  Nutrition Goals Re-Evaluation     Row Name 12/03/23 1540             Goals   Nutrition Goal healthy eating          Nutrition Goals Discharge (Final Nutrition Goals Re-Evaluation):  Nutrition Goals Re-Evaluation - 12/03/23 1540       Goals   Nutrition Goal healthy eating          Psychosocial: Target Goals: Acknowledge presence or absence of significant depression and/or stress, maximize coping skills, provide positive support system. Participant is able to verbalize types and ability to use techniques and skills needed for reducing stress and depression.  Initial Review & Psychosocial Screening:   Quality of Life Scores:  Quality of Life - 11/10/23 0928       Quality of Life   Select Quality of Life      Quality of Life Scores   Health/Function Pre 22.63 %    Socioeconomic Pre  23.06 %    Psych/Spiritual Pre 24.86 %    Family Pre 26.4 %    GLOBAL Pre 23.71 %         Scores of 19 and below usually indicate a poorer quality of life in these areas.  A difference of  2-3 points is a clinically meaningful difference.  A difference of 2-3 points in the total score of the Quality of Life Index has been associated with significant improvement in overall quality of life, self-image, physical symptoms, and general health in studies assessing change in quality of life.  PHQ-9: Review Flowsheet       10/21/2023  11/07/2016 11/03/2016  Depression screen PHQ 2/9  Decreased Interest 0 0 0  Down, Depressed, Hopeless 0 0 0  PHQ - 2 Score 0 0 0  Altered sleeping 0 - -  Tired, decreased energy 1 - -  Change in appetite 1 - -  Feeling bad or failure about yourself  0 - -  Trouble concentrating 0 - -  Moving slowly or fidgety/restless 0 - -  Suicidal thoughts 0 - -  PHQ-9 Score 2 - -  Difficult doing work/chores Not difficult at all - -   Interpretation of Total Score  Total Score Depression Severity:  1-4 = Minimal depression, 5-9 = Mild depression, 10-14 = Moderate depression, 15-19 = Moderately severe depression, 20-27 = Severe depression   Psychosocial Evaluation and Intervention:   Psychosocial Re-Evaluation:   Psychosocial Discharge (Final Psychosocial Re-Evaluation):   Vocational Rehabilitation: Provide vocational rehab assistance to qualifying candidates.   Vocational Rehab Evaluation & Intervention:   Education: Education Goals: Education classes will be provided on a weekly basis, covering required topics. Participant will state understanding/return demonstration of topics presented.  Learning Barriers/Preferences:   Education Topics: Hypertension, Hypertension Reduction -Define heart disease and high blood pressure. Discus how high blood pressure affects the body and ways to reduce high blood pressure.   Exercise and Your Heart -Discuss why it is important to exercise, the FITT principles of exercise, normal and abnormal responses to exercise, and how to exercise safely. Flowsheet Row CARDIAC REHAB PHASE II EXERCISE from 12/31/2023 in [place] CARDIAC REHABILITATION  Date 12/31/23  Educator HB  Instruction Review Code 1- Verbalizes Understanding    Angina -Discuss definition of angina, causes of angina, treatment of angina, and how to decrease risk of having angina.   Cardiac Medications -Review what the following cardiac medications are used for, how they affect  the body, and side effects that may occur when taking the medications.  Medications include Aspirin , Beta blockers, calcium  channel blockers, ACE Inhibitors, angiotensin receptor blockers, diuretics, digoxin, and antihyperlipidemics.   Congestive Heart Failure -Discuss the definition of CHF, how to live with CHF, the signs and symptoms of CHF, and how keep track of weight and sodium intake.   Heart Disease and Intimacy -Discus the effect sexual activity has on the heart, how changes occur during intimacy as we age, and safety during sexual activity.   Smoking Cessation / COPD -Discuss different methods to quit smoking, the health benefits of quitting smoking, and the definition of COPD.   Nutrition I: Fats -Discuss the types of cholesterol, what cholesterol does to the heart, and how cholesterol levels can be controlled.   Nutrition II: Labels -Discuss the different components of food labels and how to read food label   Heart Parts/Heart Disease and PAD -Discuss the anatomy of the heart, the pathway of blood circulation through the heart, and  these are affected by heart disease.   Stress I: Signs and Symptoms -Discuss the causes of stress, how stress may lead to anxiety and depression, and ways to limit stress.   Stress II: Relaxation -Discuss different types of relaxation techniques to limit stress.   Warning Signs of Stroke / TIA -Discuss definition of a stroke, what the signs and symptoms are of a stroke, and how to identify when someone is having stroke.   Knowledge Questionnaire Score:  Knowledge Questionnaire Score - 11/10/23 0929       Knowledge Questionnaire Score   Pre Score 25/26          Core Components/Risk Factors/Bonnie Martin Goals at Admission:   Core Components/Risk Factors/Bonnie Martin Goals Review:    Core Components/Risk Factors/Bonnie Martin Goals at Discharge (Final Review):    ITP Comments:  ITP Comments     Row Name 11/10/23 1457 11/11/23 1422  12/09/23 1252 01/06/24 1516 02/03/24 1154   ITP Comments First full day of exercise!  Bonnie Martin was oriented to gym and equipment including functions, settings, policies, and procedures.  Bonnie Martin's individual exercise prescription and treatment plan were reviewed.  All starting workloads were established based on the results of the 6 minute walk test done at initial orientation visit.  The plan for exercise progression was also introduced and progression will be customized based on Bonnie Martin's performance and goals. 30 day review completed. ITP sent to Dr. Dorn Ross, Medical Director of Cardiac Rehab. Continue with ITP unless changes are made by physician.  New to program 30 day review completed. ITP sent to Dr. Dorn Ross, Medical Director of Cardiac Rehab. Continue with ITP unless changes are made by physician. 30 day review completed. ITP sent to Dr. Dorn Ross, Medical Director of Cardiac Rehab. Continue with ITP unless changes are made by physician. Bonnie Martin has been out with work and only attend last week for first time, unable to assess for goals. 30 day review completed. ITP sent to Dr. Dorn Ross, Medical Director of Cardiac Rehab. Continue with ITP unless changes are made by physician. Bonnie Martin continues to be out with work  last attended on 12/31/23, unable to assess for goals.    Row Name 03/02/24 1109 03/30/24 0952 04/01/24 1104 04/14/24 1308 04/27/24 1256   ITP Comments 30 day review completed. ITP sent to Dr. Dorn Ross, Medical Director of Cardiac Rehab. Continue with ITP unless changes are made by physician. Bonnie Martin continues to be out with work  last attended on 12/31/23, unable to assess for goals. 30 day review completed. ITP sent to Dr. Dorn Ross, Medical Director of Cardiac Rehab. Continue with ITP unless changes are made by physician. Bonnie Martin continues to be out with work  last attended on 12/31/23, unable to assess for goals.  Has not returned our calls. Called to check  on Bonnie Martin, left message, sent letter Bonnie Martin called in response to letter that was sent about resuming cardiac rehab. Bonnie Martin plans to return to the program next Thursday 8/7 and try attending on Thursdays and Fridays to finish. 30 day review completed. ITP sent to Dr. Dorn Ross, Medical Director of Cardiac Rehab. Continue with ITP unless changes are made by physician.  Only returned to rehab for first time yesterday since April.      Comments: 30 day review

## 2024-04-28 ENCOUNTER — Encounter (HOSPITAL_COMMUNITY)

## 2024-04-29 ENCOUNTER — Encounter (HOSPITAL_COMMUNITY)

## 2024-05-03 NOTE — Progress Notes (Signed)
 " Cardiology Office Note    Patient Name: Bonnie Martin Date of Encounter: 05/03/2024  Primary Care Provider:  Lenon Boyer, FNP Primary Cardiologist:  Bonnie DELENA Leavens, MD Primary Electrophysiologist: None   Past Medical History    Past Medical History:  Diagnosis Date   Hypertension    Thyroid disease     History of Present Illness  Bonnie Martin is a 63 y.o. female with a PMH of CAD s/p Takotsubo cardiomyopathy 2002, GERD, HTN, hypothyroid, HTN, HLD, CKD stage IIIa who presents today for 40-month follow-up.  [name] was last seen by Bonnie Martin to establish care on 01/21/2024.  During her visit she patient denied any chest pain but did have lower extremity edema possibly secondary to knee replacement.  She was advised to consolidate Lasix  to 60 mg once daily to manage lower extremity swelling.  BP was elevated however patient had missed doses prior to office visit and was advised to resume medications. She also had a sleep study completed to rule out sleep apnea.  She was unable to postdating cardiac rehab due to schedule conflict.    [name] presents today for 49-month follow-up.  She reports doing well since her previous visit but is still experiencing significant swelling in her lower extremities.  She works as a runner, broadcasting/film/video for [employer] and does have prolonged standing at work. The swelling is reduced by elevating her legs and taking her prescribed diuretic, Lasix , at a dose of 60 mg once daily. Occasionally, she takes an additional half pill at night, resulting in a total of 80 mg on some days. The swelling is less severe in the morning but worsens throughout the day. She is currently taking amlodipine  for hypertension and suspects it may contribute to her swelling. She has tried using compression stockings, but they were uncomfortable and left marks on her legs. She is open to trying different compression stockings if recommended.  Her dietary habits include occasional consumption of high-sodium foods such as frozen dinners and sodas, which may contribute to fluid retention. She drinks two to three bottles of water per day and plans to increase her water intake to at least 64 ounces daily. She experiences knee pain, which limits her ability to exercise. She has been advised to lose weight to qualify for knee surgery. She has attempted cardiac rehab but finds it difficult to attend due to her work schedule. She works at a [employer] and finds it challenging to fit exercise into her routine. She has not been consistent with her Repatha  injections due to personal and family stressors, including caring for her husband who has significant spinal issues and requires frequent medical attention. Patient denies chest pain, palpitations, dyspnea, PND, orthopnea, nausea, vomiting, dizziness, syncope, edema, weight gain, or early satiety.  Discussed the use of AI scribe software for clinical note transcription with the patient, who gave verbal consent to proceed.  History of Present Illness   Review of Systems  Please see the history of present illness.    All other systems reviewed and are otherwise negative except as noted above.  Physical Exam    Wt Readings from Last 3 Encounters:  01/21/24 286 lb (129.7 kg)  10/21/23 285 lb 8 oz (129.5 kg)  10/05/23 275 lb (124.7 kg)   CD:Uyzmz were no vitals filed for this visit.,There is no height or weight on file to calculate BMI. GEN: Well nourished, well developed in no acute distress Neck: No JVD; No carotid  bruits Pulmonary: Clear to auscultation without rales, wheezing or rhonchi  Cardiovascular: Normal rate. Regular rhythm. Normal S1. Normal S2.   Murmurs: There is no murmur.  ABDOMEN: Soft, non-tender, non-distended EXTREMITIES: +3 lower extremity edema to the right and +2 to the left  EKG/LABS/ Recent Cardiac Studies   ECG personally reviewed by me today -none  completed today  Risk Assessment/Calculations:          Lab Results  Component Value Date   WBC 6.7 09/23/2023   HGB 11.2 (L) 09/23/2023   HCT 33.5 (L) 09/23/2023   MCV 87.7 09/23/2023   PLT 187 09/23/2023   Lab Results  Component Value Date   CREATININE 1.23 (H) 09/23/2023   BUN 16 09/23/2023   NA 139 09/23/2023   K 4.1 09/23/2023   CL 111 09/23/2023   CO2 22 09/23/2023   Lab Results  Component Value Date   CHOL 204 (H) 09/22/2023   HDL 65 09/22/2023   LDLCALC 117 (H) 09/22/2023   TRIG 112 09/22/2023   CHOLHDL 3.1 09/22/2023    Lab Results  Component Value Date   HGBA1C 5.7 (H) 09/22/2023   Assessment & Plan    Assessment & Plan  1.  History of CAD: - Patient reports doing well today with no new cardiac complaints. - Continue Repatha  140 mg, Toprol -XL 200 mg and as needed Nitrostat .  2.  Essential hypertension: - Patient's blood pressure today was stable at 120/70 - Continue Toprol -XL 20 mg daily, Diovan 320 mg and reduced dose of Norvasc  5 mg daily  3.  Hyperlipidemia -Hyperlipidemia management with Repatha  has been inconsistent. Emphasized importance of resuming medication. - Resume Repatha  injections as prescribed.  4.  CKD stage IIIa: - She reports ongoing use of diuretics with additional as needed 20 mg -We will check BMET today -Patient advised to maintain good hydration with at least 64 ounces of water.  5.  Sleep disturbance: -Suspected sleep apnea requires further evaluation. The patient was supposed to undergo a sleep study ordered by Bonnie Martin, but it was not completed due to the patient's busy schedule and limited communication. - Reorder home sleep study with Watch Pat device. - Coordinate with sleep study provider to resend device and instructions.  6. Chronic venous insufficiency: Chronic venous insufficiency with edema, possibly exacerbated by amlodipine  and venous stasis. Compression stockings were improperly fitted. Discussed  hydration and salt intake management. - Provide brochure for compression stockings fitting in [place]. - Encourage use of properly fitted compression stockings. - Advise elevation of legs when possible. - Check kidney function today to monitor impact of diuretics.  6. Right knee pain with severe osteoarthritis: Severe osteoarthritis causing significant pain and limiting activity. Weight loss is necessary for surgery. - Encourage weight loss to meet criteria for knee surgery. - Promote low-impact exercises such as swimming or stationary biking to manage pain and improve mobility.  Disposition: Follow-up with Bonnie DELENA Santo, MD or APP in 3 months    Signed, Wyn Raddle, Jackee Shove, NP 05/03/2024, 7:24 AM [facility] "

## 2024-05-04 ENCOUNTER — Encounter: Payer: Self-pay | Admitting: Nurse Practitioner

## 2024-05-04 ENCOUNTER — Ambulatory Visit: Attending: Cardiology | Admitting: Nurse Practitioner

## 2024-05-04 VITALS — BP 120/70 | HR 60 | Ht 62.0 in | Wt 256.4 lb

## 2024-05-04 DIAGNOSIS — M170 Bilateral primary osteoarthritis of knee: Secondary | ICD-10-CM

## 2024-05-04 DIAGNOSIS — E039 Hypothyroidism, unspecified: Secondary | ICD-10-CM

## 2024-05-04 DIAGNOSIS — E785 Hyperlipidemia, unspecified: Secondary | ICD-10-CM

## 2024-05-04 DIAGNOSIS — I10 Essential (primary) hypertension: Secondary | ICD-10-CM

## 2024-05-04 DIAGNOSIS — I2510 Atherosclerotic heart disease of native coronary artery without angina pectoris: Secondary | ICD-10-CM

## 2024-05-04 DIAGNOSIS — N183 Chronic kidney disease, stage 3 unspecified: Secondary | ICD-10-CM | POA: Diagnosis not present

## 2024-05-04 LAB — BASIC METABOLIC PANEL WITH GFR
BUN/Creatinine Ratio: 19 (ref 12–28)
BUN: 31 mg/dL — ABNORMAL HIGH (ref 8–27)
CO2: 22 mmol/L (ref 20–29)
Calcium: 11.1 mg/dL — ABNORMAL HIGH (ref 8.7–10.3)
Chloride: 101 mmol/L (ref 96–106)
Creatinine, Ser: 1.67 mg/dL — ABNORMAL HIGH (ref 0.57–1.00)
Glucose: 89 mg/dL (ref 70–99)
Potassium: 4.3 mmol/L (ref 3.5–5.2)
Sodium: 139 mmol/L (ref 134–144)
eGFR: 35 mL/min/1.73 — ABNORMAL LOW (ref >59)

## 2024-05-04 MED ORDER — FUROSEMIDE 40 MG PO TABS: 60.0000 mg | ORAL_TABLET | Freq: Every day | ORAL | 1 refills | Status: DC

## 2024-05-04 MED ORDER — NITROGLYCERIN 0.4 MG SL SUBL: 0.4000 mg | SUBLINGUAL_TABLET | SUBLINGUAL | 3 refills | Status: AC | PRN

## 2024-05-04 MED ORDER — AMLODIPINE BESYLATE 5 MG PO TABS: 5.0000 mg | ORAL_TABLET | Freq: Every day | ORAL | 1 refills | Status: AC

## 2024-05-04 MED ORDER — REPATHA SURECLICK 140 MG/ML ~~LOC~~ SOAJ
140.0000 mg | SUBCUTANEOUS | 2 refills | Status: AC
Start: 2024-05-04 — End: ?

## 2024-05-04 NOTE — Patient Instructions (Addendum)
 Medication Instructions:  DECREASED Norvasc  to 5mg  Take 1 tablet once a day  CONTINUE Lasix  to 60mg  once a day; can take a 20mg  as needed for increased swelling  *If you need a refill on your cardiac medications before your next appointment, please call your pharmacy*  Lab Work: TODAY-BMET  If you have labs (blood work) drawn today and your tests are completely normal, you will receive your results only by: MyChart Message (if you have MyChart) OR A paper copy in the mail If you have any lab test that is abnormal or we need to change your treatment, we will call you to review the results.  Testing/Procedures: NONE ORDERED  Follow-Up: At [facility], you and your health needs are our priority.  As part of our continuing mission to provide you with exceptional heart care, our providers are all part of one team.  This team includes your primary Cardiologist (physician) and Advanced Practice Providers or APPs (Physician Assistants and Nurse Practitioners) who all work together to provide you with the care you need, when you need it.  Your next appointment:   3 month(s)  Provider:   ANY APP    We recommend signing up for the patient portal called MyChart.  Sign up information is provided on this After Visit Summary.  MyChart is used to connect with patients for Virtual Visits (Telemedicine).  Patients are able to view lab/test results, encounter notes, upcoming appointments, etc.  Non-urgent messages can be sent to your provider as well.   To learn more about what you can do with MyChart, go to [email or web address].   Other Instructions Please get some ted hose or compression stockings. They can be purchased at your local medical supply store, [employer], [employer] or [employer] fundraiser.  Put them on first thing in the morning and wear them during the day . Elevate your feet during the day and remove hose in the evening before bed.

## 2024-05-05 ENCOUNTER — Other Ambulatory Visit: Payer: Self-pay

## 2024-05-05 ENCOUNTER — Encounter (HOSPITAL_COMMUNITY)

## 2024-05-05 ENCOUNTER — Ambulatory Visit: Payer: Self-pay | Admitting: Nurse Practitioner

## 2024-05-05 DIAGNOSIS — I2510 Atherosclerotic heart disease of native coronary artery without angina pectoris: Secondary | ICD-10-CM

## 2024-05-05 DIAGNOSIS — I10 Essential (primary) hypertension: Secondary | ICD-10-CM

## 2024-05-05 DIAGNOSIS — N183 Chronic kidney disease, stage 3 unspecified: Secondary | ICD-10-CM

## 2024-05-05 MED ORDER — FUROSEMIDE 40 MG PO TABS: 40.0000 mg | ORAL_TABLET | Freq: Every day | ORAL | Status: AC

## 2024-05-06 ENCOUNTER — Encounter (HOSPITAL_COMMUNITY)

## 2024-05-10 ENCOUNTER — Ambulatory Visit (HOSPITAL_COMMUNITY)

## 2024-05-12 ENCOUNTER — Encounter (HOSPITAL_COMMUNITY)

## 2024-05-13 ENCOUNTER — Encounter (HOSPITAL_COMMUNITY)

## 2024-05-19 ENCOUNTER — Encounter (HOSPITAL_COMMUNITY): Attending: Internal Medicine

## 2024-05-19 DIAGNOSIS — I214 Non-ST elevation (NSTEMI) myocardial infarction: Secondary | ICD-10-CM | POA: Insufficient documentation

## 2024-05-20 ENCOUNTER — Encounter (HOSPITAL_COMMUNITY)

## 2024-05-25 ENCOUNTER — Encounter (HOSPITAL_COMMUNITY): Payer: Self-pay | Admitting: *Deleted

## 2024-05-25 DIAGNOSIS — I214 Non-ST elevation (NSTEMI) myocardial infarction: Secondary | ICD-10-CM

## 2024-05-25 NOTE — Progress Notes (Signed)
 Cardiac Individual Treatment Plan  Patient Details  Name: Bonnie Martin MRN: [id number] Date of Birth: Oct 23, 1960 Referring Provider:   Flowsheet Row CARDIAC REHAB PHASE II ORIENTATION from 10/21/2023 in [facility] CARDIAC REH[facility]ILITATION  Referring Provider Kate Bruckner MD    Initial Encounter Date:  Flowsheet Row CARDIAC REHAB PHASE II ORIENTATION from 10/21/2023 in [facility]ARDIAC REH[facility]ILITATION  Date 10/21/23    Visit Diagnosis: NSTEMI (non-ST elevated myocardial infarction) [facility])  Patient's Home Medications on Admission:  Current Outpatient Medications:    Acetaminophen  Extra Strength 500 MG TABS, Take 500-1,000 mg by mouth 3 (three) times daily as needed (Pain)., Disp: , Rfl:    allopurinol  (ZYLOPRIM ) 300 MG tablet, Take 300 mg by mouth every evening., Disp: , Rfl:    amLODipine  (NORVASC ) 5 MG tablet, Take 1 tablet (5 mg total) by mouth daily., Disp: 90 tablet, Rfl: 1   aspirin  EC 81 MG tablet, Take 1 tablet (81 mg total) by mouth daily. Swallow whole., Disp: , Rfl:    clobetasol ointment (TEMOVATE) 0.05 %, daily., Disp: , Rfl:    Clobetasol Propionate 0.05 % shampoo, Apply topically every 14 (fourteen) days., Disp: , Rfl:    colchicine 0.6 MG tablet, Take 0.6 mg by mouth daily as needed (Gout)., Disp: , Rfl:    Evolocumab  (REPATHA  SURECLICK) 140 MG/ML SOAJ, Inject 140 mg into the skin every 14 (fourteen) days., Disp: 6 mL, Rfl: 2   finasteride (PROSCAR) 5 MG tablet, 2.5 mg daily., Disp: , Rfl:    fluticasone (FLONASE) 50 MCG/ACT nasal spray, Place 1 spray into both nostrils daily as needed for allergies or rhinitis., Disp: , Rfl:    furosemide  (LASIX ) 40 MG tablet, Take 1 tablet (40 mg total) by mouth daily. Can take an additional 20mg  as needed for increased swelling, Disp: , Rfl:    halobetasol (ULTRAVATE) 0.05 % cream, Apply 1 Application topically daily., Disp: , Rfl:    hydrocortisone (ANUSOL-HC) 2.5 % rectal cream, Place 1 Application rectally 2 (two) times  daily as needed for hemorrhoids or anal itching., Disp: , Rfl:    levothyroxine  (SYNTHROID ) 112 MCG tablet, Take by mouth., Disp: , Rfl:    metoprolol  (TOPROL -XL) 200 MG 24 hr tablet, Take 200 mg by mouth daily., Disp: , Rfl:    nitroGLYCERIN  (NITROSTAT ) 0.4 MG SL tablet, Place 1 tablet (0.4 mg total) under the tongue every 5 (five) minutes x 3 doses as needed for chest pain., Disp: 25 tablet, Rfl: 3   omeprazole  (PRILOSEC) 40 MG capsule, Take 40 mg by mouth daily as needed (Indigestion)., Disp: , Rfl:    valsartan (DIOVAN) 320 MG tablet, Take 320 mg by mouth daily., Disp: , Rfl:   Past Medical History: Past Medical History:  Diagnosis Date   Hypertension    Thyroid disease     Tobacco Use: Social History   Tobacco Use  Smoking Status Never  Smokeless Tobacco Never    Labs: Review Flowsheet       Latest Ref Rng & Units 09/22/2023  Labs for ITP Cardiac and Pulmonary Rehab  Cholestrol 0 - 200 mg/dL 795   LDL (calc) 0 - 99 mg/dL 882   HDL-C >59 mg/dL 65   Trlycerides <849 mg/dL 887   Hemoglobin J8r 4.8 - 5.6 % 5.7     Capillary Blood Glucose: No results found for: GLUCAP   Exercise Target Goals: Exercise Program Goal: Individual exercise prescription set using results from initial 6 min walk test and THRR while considering  patient's  activity barriers and safety.   Exercise Prescription Goal: Starting with aerobic activity 30 plus minutes a day, 3 days per week for initial exercise prescription. Provide home exercise prescription and guidelines that participant acknowledges understanding prior to discharge.  Activity Barriers & Risk Stratification:   6 Minute Walk:   Oxygen Initial Assessment:   Oxygen Re-Evaluation:   Oxygen Discharge (Final Oxygen Re-Evaluation):   Initial Exercise Prescription:   Perform Capillary Blood Glucose checks as needed.  Exercise Prescription Changes:   Exercise Prescription Changes     Row Name 12/31/23 1500              Response to Exercise   Blood Pressure (Admit) 126/68       Blood Pressure (Exercise) 142/90       Blood Pressure (Exit) 122/80       Heart Rate (Admit) 65 bpm       Heart Rate (Exercise) 92 bpm       Heart Rate (Exit) 68 bpm       Rating of Perceived Exertion (Exercise) 14       Perceived Dyspnea (Exercise) 1       Duration Continue with 30 min of aerobic exercise without signs/symptoms of physical distress.       Intensity THRR unchanged         Progression   Progression Continue to progress workloads to maintain intensity without signs/symptoms of physical distress.         Resistance Training   Training Prescription Yes       Weight 3       Reps 10-15         NuStep   Level 3       SPM 86       Minutes 15       METs 2.2         Arm Ergometer   Level 3       Watts 52       Minutes 15       METs 1.9          Exercise Comments:   Exercise Goals and Review:   Exercise Goals Re-Evaluation :  Exercise Goals Re-Evaluation     Row Name 12/03/23 1536             Exercise Goal Re-Evaluation   Exercise Goals Review Increase Physical Activity;Increase Strength and Stamina;Understanding of Exercise Prescription       Comments Issac is doing well in rehab. She has noticed a slight increase in her endurance since starting the program. She has not been able to come to some classes due to work meeting but is trying. She wants to start walking since the weather is getting nice and hopes it also helps her knee       Expected Outcomes continue to come tp rehab   add exericse outside of rehab to help with endurance           Discharge Exercise Prescription (Final Exercise Prescription Changes):  Exercise Prescription Changes - 12/31/23 1500       Response to Exercise   Blood Pressure (Admit) 126/68    Blood Pressure (Exercise) 142/90    Blood Pressure (Exit) 122/80    Heart Rate (Admit) 65 bpm    Heart Rate (Exercise) 92 bpm    Heart Rate (Exit) 68 bpm     Rating of Perceived Exertion (Exercise) 14    Perceived Dyspnea (Exercise) 1    Duration Continue  with 30 min of aerobic exercise without signs/symptoms of physical distress.    Intensity THRR unchanged      Progression   Progression Continue to progress workloads to maintain intensity without signs/symptoms of physical distress.      Resistance Training   Training Prescription Yes    Weight 3    Reps 10-15      NuStep   Level 3    SPM 86    Minutes 15    METs 2.2      Arm Ergometer   Level 3    Watts 52    Minutes 15    METs 1.9          Nutrition:  Target Goals: Understanding of nutrition guidelines, daily intake of sodium 1500mg , cholesterol 200mg , calories 30% from fat and 7% or less from saturated fats, daily to have 5 or more servings of fruits and vegetables.  Biometrics:    Nutrition Therapy Plan and Nutrition Goals:   Nutrition Assessments:  MEDIFICTS Score Key: >=70 Need to make dietary changes  40-70 Heart Healthy Diet <= 40 Therapeutic Level Cholesterol Diet  Flowsheet Row CARDIAC REHAB PHASE II ORIENTATION from 10/21/2023 in [facility] CARDIAC RE[facility]  Picture Your Plate Total Score on Admission 62   Picture Your Plate Scores: <59 Unhealthy dietary pattern with much room for improvement. 41-50 Dietary pattern unlikely to meet recommendations for good health and room for improvement. 51-60 More healthful dietary pattern, with some room for improvement.  >60 Healthy dietary pattern, although there may be some specific behaviors that could be improved.    Nutrition Goals Re-Evaluation:  Nutrition Goals Re-Evaluation     Row Name 12/03/23 1540             Goals   Nutrition Goal healthy eating          Nutrition Goals Discharge (Final Nutrition Goals Re-Evaluation):  Nutrition Goals Re-Evaluation - 12/03/23 1540       Goals   Nutrition Goal healthy eating          Psychosocial: Target Goals: Acknowledge presence or  absence of significant depression and/or stress, maximize coping skills, provide positive support system. Participant is able to verbalize types and ability to use techniques and skills needed for reducing stress and depression.  Initial Review & Psychosocial Screening:   Quality of Life Scores:  Scores of 19 and below usually indicate a poorer quality of life in these areas.  A difference of  2-3 points is a clinically meaningful difference.  A difference of 2-3 points in the total score of the Quality of Life Index has been associated with significant improvement in overall quality of life, self-image, physical symptoms, and general health in studies assessing change in quality of life.  PHQ-9: Review Flowsheet       10/21/2023 11/07/2016 11/03/2016  Depression screen PHQ 2/9  Decreased Interest 0 0 0  Down, Depressed, Hopeless 0 0 0  PHQ - 2 Score 0 0 0  Altered sleeping 0 - -  Tired, decreased energy 1 - -  Change in appetite 1 - -  Feeling bad or failure about yourself  0 - -  Trouble concentrating 0 - -  Moving slowly or fidgety/restless 0 - -  Suicidal thoughts 0 - -  PHQ-9 Score 2 - -  Difficult doing work/chores Not difficult at all - -   Interpretation of Total Score  Total Score Depression Severity:  1-4 = Minimal depression, 5-9 = Mild depression,  10-14 = Moderate depression, 15-19 = Moderately severe depression, 20-27 = Severe depression   Psychosocial Evaluation and Intervention:   Psychosocial Re-Evaluation:   Psychosocial Discharge (Final Psychosocial Re-Evaluation):   Vocational Rehabilitation: Provide vocational rehab assistance to qualifying candidates.   Vocational Rehab Evaluation & Intervention:   Education: Education Goals: Education classes will be provided on a weekly basis, covering required topics. Participant will state understanding/return demonstration of topics presented.  Learning Barriers/Preferences:   Education Topics: Hypertension,  Hypertension Reduction -Define heart disease and high blood pressure. Discus how high blood pressure affects the body and ways to reduce high blood pressure.   Exercise and Your Heart -Discuss why it is important to exercise, the FITT principles of exercise, normal and abnormal responses to exercise, and how to exercise safely. Flowsheet Row CARDIAC REHAB PHASE II EXERCISE from 12/31/2023 in [facility] CARDIAC REHABILITATION  Date 12/31/23  Educator HB  Instruction Review Code 1- Verbalizes Understanding    Angina -Discuss definition of angina, causes of angina, treatment of angina, and how to decrease risk of having angina.   Cardiac Medications -Review what the following cardiac medications are used for, how they affect the body, and side effects that may occur when taking the medications.  Medications include Aspirin , Beta blockers, calcium  channel blockers, ACE Inhibitors, angiotensin receptor blockers, diuretics, digoxin, and antihyperlipidemics.   Congestive Heart Failure -Discuss the definition of CHF, how to live with CHF, the signs and symptoms of CHF, and how keep track of weight and sodium intake.   Heart Disease and Intimacy -Discus the effect sexual activity has on the heart, how changes occur during intimacy as we age, and safety during sexual activity.   Smoking Cessation / COPD -Discuss different methods to quit smoking, the health benefits of quitting smoking, and the definition of COPD.   Nutrition I: Fats -Discuss the types of cholesterol, what cholesterol does to the heart, and how cholesterol levels can be controlled.   Nutrition II: Labels -Discuss the different components of food labels and how to read food label   Heart Parts/Heart Disease and PAD -Discuss the anatomy of the heart, the pathway of blood circulation through the heart, and these are affected by heart disease.   Stress I: Signs and Symptoms -Discuss the causes of stress, how stress may  lead to anxiety and depression, and ways to limit stress.   Stress II: Relaxation -Discuss different types of relaxation techniques to limit stress.   Warning Signs of Stroke / TIA -Discuss definition of a stroke, what the signs and symptoms are of a stroke, and how to identify when someone is having stroke.   Knowledge Questionnaire Score:   Core Components/Risk Factors/Patient Goals at Admission:   Core Components/Risk Factors/Patient Goals Review:    Core Components/Risk Factors/Patient Goals at Discharge (Final Review):    ITP Comments:  ITP Comments     Row Name 12/09/23 1252 01/06/24 1516 02/03/24 1154 03/02/24 1109 03/30/24 0952   ITP Comments 30 day review completed. ITP sent to Dr. Dorn Ross, Medical Director of Cardiac Rehab. Continue with ITP unless changes are made by physician. 30 day review completed. ITP sent to Dr. Dorn Ross, Medical Director of Cardiac Rehab. Continue with ITP unless changes are made by physician. Ondrea has been out with work and only attend last week for first time, unable to assess for goals. 30 day review completed. ITP sent to Dr. Dorn Ross, Medical Director of Cardiac Rehab. Continue with ITP unless changes are made by  physician. Shakya continues to be out with work  last attended on 12/31/23, unable to assess for goals. 30 day review completed. ITP sent to Dr. Dorn Ross, Medical Director of Cardiac Rehab. Continue with ITP unless changes are made by physician. Jaiyla continues to be out with work  last attended on 12/31/23, unable to assess for goals. 30 day review completed. ITP sent to Dr. Dorn Ross, Medical Director of Cardiac Rehab. Continue with ITP unless changes are made by physician. Gesselle continues to be out with work  last attended on 12/31/23, unable to assess for goals.  Has not returned our calls.    Row Name 04/01/24 1104 04/14/24 1308 04/27/24 1256 05/25/24 1541     ITP Comments Called to check on  patient, left message, sent letter Patient called in response to letter that was sent about resuming cardiac rehab. She plans to return to the program next Thursday 8/7 and try attending on Thursdays and Fridays to finish. 30 day review completed. ITP sent to Dr. Dorn Ross, Medical Director of Cardiac Rehab. Continue with ITP unless changes are made by physician.  Only returned to rehab for first time yesterday since April. 30 day review completed. ITP sent to Dr. Dorn Ross, Medical Director of Cardiac Rehab. Continue with ITP unless changes are made by physician.  Attendance continues to be spotty.  Last attended on 04/26/24.  36 weeks will be up on 06/24/24       Comments: 30 day review

## 2024-05-26 ENCOUNTER — Encounter (HOSPITAL_COMMUNITY)

## 2024-05-27 ENCOUNTER — Encounter (HOSPITAL_COMMUNITY)

## 2024-06-02 ENCOUNTER — Encounter (HOSPITAL_COMMUNITY)

## 2024-06-03 ENCOUNTER — Encounter (HOSPITAL_COMMUNITY)

## 2024-06-09 ENCOUNTER — Encounter (HOSPITAL_COMMUNITY)

## 2024-06-10 ENCOUNTER — Encounter (HOSPITAL_COMMUNITY)

## 2024-06-22 ENCOUNTER — Encounter (HOSPITAL_COMMUNITY): Payer: Self-pay | Admitting: *Deleted

## 2024-06-22 DIAGNOSIS — I214 Non-ST elevation (NSTEMI) myocardial infarction: Secondary | ICD-10-CM

## 2024-06-22 NOTE — Progress Notes (Signed)
 Cardiac Individual Treatment Plan  Patient Details  Name: Bonnie Martin MRN: [id number] Date of Birth: [birth year] Referring Provider:   Flowsheet Row CARDIAC REHAB PHASE II ORIENTATION from 10/21/2023 in [facility] CARDIAC REHABILITATION  Referring Provider Kate Bruckner MD    Initial Encounter Date:  Flowsheet Row CARDIAC REHAB PHASE II ORIENTATION from 10/21/2023 in [place] CARDIAC REHABILITATION  Date 10/21/23    Visit Diagnosis: NSTEMI (non-ST elevated myocardial infarction) [facility])  Patient's Home Medications on Admission:  Current Outpatient Medications:    Acetaminophen  Extra Strength 500 MG TABS, Take 500-1,000 mg by mouth 3 (three) times daily as needed (Pain)., Disp: , Rfl:    allopurinol  (ZYLOPRIM ) 300 MG tablet, Take 300 mg by mouth every evening., Disp: , Rfl:    amLODipine  (NORVASC ) 5 MG tablet, Take 1 tablet (5 mg total) by mouth daily., Disp: 90 tablet, Rfl: 1   aspirin  EC 81 MG tablet, Take 1 tablet (81 mg total) by mouth daily. Swallow whole., Disp: , Rfl:    clobetasol ointment (TEMOVATE) 0.05 %, daily., Disp: , Rfl:    Clobetasol Propionate 0.05 % shampoo, Apply topically every 14 (fourteen) days., Disp: , Rfl:    colchicine 0.6 MG tablet, Take 0.6 mg by mouth daily as needed (Gout)., Disp: , Rfl:    Evolocumab  (REPATHA  SURECLICK) 140 MG/ML SOAJ, Inject 140 mg into the skin every 14 (fourteen) days., Disp: 6 mL, Rfl: 2   finasteride (PROSCAR) 5 MG tablet, 2.5 mg daily., Disp: , Rfl:    fluticasone (FLONASE) 50 MCG/ACT nasal spray, Place 1 spray into both nostrils daily as needed for allergies or rhinitis., Disp: , Rfl:    furosemide  (LASIX ) 40 MG tablet, Take 1 tablet (40 mg total) by mouth daily. Can take an additional 20mg  as needed for increased swelling, Disp: , Rfl:    halobetasol (ULTRAVATE) 0.05 % cream, Apply 1 Application topically daily., Disp: , Rfl:    hydrocortisone (ANUSOL-HC) 2.5 % rectal cream, Place 1 Application rectally 2 (two) times  daily as needed for hemorrhoids or anal itching., Disp: , Rfl:    levothyroxine  (SYNTHROID ) 112 MCG tablet, Take by mouth., Disp: , Rfl:    metoprolol  (TOPROL -XL) 200 MG 24 hr tablet, Take 200 mg by mouth daily., Disp: , Rfl:    nitroGLYCERIN  (NITROSTAT ) 0.4 MG SL tablet, Place 1 tablet (0.4 mg total) under the tongue every 5 (five) minutes x 3 doses as needed for chest pain., Disp: 25 tablet, Rfl: 3   omeprazole  (PRILOSEC) 40 MG capsule, Take 40 mg by mouth daily as needed (Indigestion)., Disp: , Rfl:    valsartan (DIOVAN) 320 MG tablet, Take 320 mg by mouth daily., Disp: , Rfl:   Past Medical History: Past Medical History:  Diagnosis Date   Hypertension    Thyroid disease     Tobacco Use: Social History   Tobacco Use  Smoking Status Never  Smokeless Tobacco Never    Labs: Review Flowsheet       Latest Ref Rng & Units 09/22/2023  Labs for ITP Cardiac and Pulmonary Rehab  Cholestrol 0 - 200 mg/dL 795   LDL (calc) 0 - 99 mg/dL 882   HDL-C >59 mg/dL 65   Trlycerides <849 mg/dL 887   Hemoglobin J8r 4.8 - 5.6 % 5.7     Capillary Blood Glucose: No results found for: GLUCAP   Exercise Target Goals: Exercise Program Goal: Individual exercise prescription set using results from initial 6 min walk test and THRR while considering  patient's  activity barriers and safety.   Exercise Prescription Goal: Starting with aerobic activity 30 plus minutes a day, 3 days per week for initial exercise prescription. Provide home exercise prescription and guidelines that participant acknowledges understanding prior to discharge.  Activity Barriers & Risk Stratification:   6 Minute Walk:   Oxygen Initial Assessment:   Oxygen Re-Evaluation:   Oxygen Discharge (Final Oxygen Re-Evaluation):   Initial Exercise Prescription:   Perform Capillary Blood Glucose checks as needed.  Exercise Prescription Changes:   Exercise Prescription Changes     Row Name 12/31/23 1500              Response to Exercise   Blood Pressure (Admit) 126/68       Blood Pressure (Exercise) 142/90       Blood Pressure (Exit) 122/80       Heart Rate (Admit) 65 bpm       Heart Rate (Exercise) 92 bpm       Heart Rate (Exit) 68 bpm       Rating of Perceived Exertion (Exercise) 14       Perceived Dyspnea (Exercise) 1       Duration Continue with 30 min of aerobic exercise without signs/symptoms of physical distress.       Intensity THRR unchanged         Progression   Progression Continue to progress workloads to maintain intensity without signs/symptoms of physical distress.         Resistance Training   Training Prescription Yes       Weight 3       Reps 10-15         NuStep   Level 3       SPM 86       Minutes 15       METs 2.2         Arm Ergometer   Level 3       Watts 52       Minutes 15       METs 1.9          Exercise Comments:   Exercise Goals and Review:   Exercise Goals Re-Evaluation :    Discharge Exercise Prescription (Final Exercise Prescription Changes):  Exercise Prescription Changes - 12/31/23 1500       Response to Exercise   Blood Pressure (Admit) 126/68    Blood Pressure (Exercise) 142/90    Blood Pressure (Exit) 122/80    Heart Rate (Admit) 65 bpm    Heart Rate (Exercise) 92 bpm    Heart Rate (Exit) 68 bpm    Rating of Perceived Exertion (Exercise) 14    Perceived Dyspnea (Exercise) 1    Duration Continue with 30 min of aerobic exercise without signs/symptoms of physical distress.    Intensity THRR unchanged      Progression   Progression Continue to progress workloads to maintain intensity without signs/symptoms of physical distress.      Resistance Training   Training Prescription Yes    Weight 3    Reps 10-15      NuStep   Level 3    SPM 86    Minutes 15    METs 2.2      Arm Ergometer   Level 3    Watts 52    Minutes 15    METs 1.9          Nutrition:  Target Goals: Understanding of nutrition guidelines, daily  intake of sodium 1500mg ,  cholesterol 200mg , calories 30% from fat and 7% or less from saturated fats, daily to have 5 or more servings of fruits and vegetables.  Biometrics:    Nutrition Therapy Plan and Nutrition Goals:   Nutrition Assessments:  MEDIFICTS Score Key: >=70 Need to make dietary changes  40-70 Heart Healthy Diet <= 40 Therapeutic Level Cholesterol Diet  Flowsheet Row CARDIAC REHAB PHASE II ORIENTATION from 10/21/2023 in [facility] CARDIAC RE[facility]IL[facility]  Picture Your Plate Total Score on Admission 62   Picture Your Plate Scores: <59 Unhealthy dietary pattern with much room for improvement. 41-50 Dietary pattern unlikely to meet recommendations for good health and room for improvement. 51-60 More healthful dietary pattern, with some room for improvement.  >60 Healthy dietary pattern, although there may be some specific behaviors that could be improved.    Nutrition Goals Re-Evaluation:   Nutrition Goals Discharge (Final Nutrition Goals Re-Evaluation):   Psychosocial: Target Goals: Acknowledge presence or absence of significant depression and/or stress, maximize coping skills, provide positive support system. Participant is able to verbalize types and ability to use techniques and skills needed for reducing stress and depression.  Initial Review & Psychosocial Screening:   Quality of Life Scores:  Scores of 19 and below usually indicate a poorer quality of life in these areas.  A difference of  2-3 points is a clinically meaningful difference.  A difference of 2-3 points in the total score of the Quality of Life Index has been associated with significant improvement in overall quality of life, self-image, physical symptoms, and general health in studies assessing change in quality of life.  PHQ-9: Review Flowsheet       10/21/2023 11/07/2016 11/03/2016  Depression screen PHQ 2/9  Decreased Interest 0 0 0  Down, Depressed, Hopeless 0 0 0  PHQ - 2 Score 0 0 0   Altered sleeping 0 - -  Tired, decreased energy 1 - -  Change in appetite 1 - -  Feeling bad or failure about yourself  0 - -  Trouble concentrating 0 - -  Moving slowly or fidgety/restless 0 - -  Suicidal thoughts 0 - -  PHQ-9 Score 2 - -  Difficult doing work/chores Not difficult at all - -   Interpretation of Total Score  Total Score Depression Severity:  1-4 = Minimal depression, 5-9 = Mild depression, 10-14 = Moderate depression, 15-19 = Moderately severe depression, 20-27 = Severe depression   Psychosocial Evaluation and Intervention:   Psychosocial Re-Evaluation:   Psychosocial Discharge (Final Psychosocial Re-Evaluation):   Vocational Rehabilitation: Provide vocational rehab assistance to qualifying candidates.   Vocational Rehab Evaluation & Intervention:   Education: Education Goals: Education classes will be provided on a weekly basis, covering required topics. Participant will state understanding/return demonstration of topics presented.  Learning Barriers/Preferences:   Education Topics: Hypertension, Hypertension Reduction -Define heart disease and high blood pressure. Discus how high blood pressure affects the body and ways to reduce high blood pressure.   Exercise and Your Heart -Discuss why it is important to exercise, the FITT principles of exercise, normal and abnormal responses to exercise, and how to exercise safely. Flowsheet Row CARDIAC REHAB PHASE II EXERCISE from 12/31/2023 in [place] CARDIAC REHABILITATION  Date 12/31/23  Educator HB  Instruction Review Code 1- Verbalizes Understanding    Angina -Discuss definition of angina, causes of angina, treatment of angina, and how to decrease risk of having angina.   Cardiac Medications -Review what the following cardiac medications are used for, how they  affect the body, and side effects that may occur when taking the medications.  Medications include Aspirin , Beta blockers, calcium  channel  blockers, ACE Inhibitors, angiotensin receptor blockers, diuretics, digoxin, and antihyperlipidemics.   Congestive Heart Failure -Discuss the definition of CHF, how to live with CHF, the signs and symptoms of CHF, and how keep track of weight and sodium intake.   Heart Disease and Intimacy -Discus the effect sexual activity has on the heart, how changes occur during intimacy as we age, and safety during sexual activity.   Smoking Cessation / COPD -Discuss different methods to quit smoking, the health benefits of quitting smoking, and the definition of COPD.   Nutrition I: Fats -Discuss the types of cholesterol, what cholesterol does to the heart, and how cholesterol levels can be controlled.   Nutrition II: Labels -Discuss the different components of food labels and how to read food label   Heart Parts/Heart Disease and PAD -Discuss the anatomy of the heart, the pathway of blood circulation through the heart, and these are affected by heart disease.   Stress I: Signs and Symptoms -Discuss the causes of stress, how stress may lead to anxiety and depression, and ways to limit stress.   Stress II: Relaxation -Discuss different types of relaxation techniques to limit stress.   Warning Signs of Stroke / TIA -Discuss definition of a stroke, what the signs and symptoms are of a stroke, and how to identify when someone is having stroke.   Knowledge Questionnaire Score:   Core Components/Risk Factors/Patient Goals at Admission:   Core Components/Risk Factors/Patient Goals Review:    Core Components/Risk Factors/Patient Goals at Discharge (Final Review):    ITP Comments:  ITP Comments     Row Name 01/06/24 1516 02/03/24 1154 03/02/24 1109 03/30/24 0952 04/01/24 1104   ITP Comments 30 day review completed. ITP sent to Dr. Dorn Ross, Medical Director of Cardiac Rehab. Continue with ITP unless changes are made by physician. Bonnie Martin has been out with work and only attend  last week for first time, unable to assess for goals. 30 day review completed. ITP sent to Dr. Dorn Ross, Medical Director of Cardiac Rehab. Continue with ITP unless changes are made by physician. Bonnie Martin continues to be out with work  last attended on 12/31/23, unable to assess for goals. 30 day review completed. ITP sent to Dr. Dorn Ross, Medical Director of Cardiac Rehab. Continue with ITP unless changes are made by physician. Bonnie Martin continues to be out with work  last attended on 12/31/23, unable to assess for goals. 30 day review completed. ITP sent to Dr. Dorn Ross, Medical Director of Cardiac Rehab. Continue with ITP unless changes are made by physician. Bonnie Martin continues to be out with work  last attended on 12/31/23, unable to assess for goals.  Has not returned our calls. Called to check on patient, left message, sent letter    Row Name 04/14/24 1308 04/27/24 1256 05/25/24 1541 06/22/24 1822     ITP Comments Patient called in response to letter that was sent about resuming cardiac rehab. She plans to return to the program next Thursday 8/7 and try attending on Thursdays and Fridays to finish. 30 day review completed. ITP sent to Dr. Dorn Ross, Medical Director of Cardiac Rehab. Continue with ITP unless changes are made by physician.  Only returned to rehab for first time yesterday since April. 30 day review completed. ITP sent to Dr. Dorn Ross, Medical Director of Cardiac Rehab. Continue with ITP unless changes are  made by physician.  Attendance continues to be spotty.  Last attended on 04/26/24.  36 weeks will be up on 06/24/24 End of 36 weeks is on Friday and she has yet to return to program.  We will discharge at this time.       Comments: 30 day review

## 2024-06-28 ENCOUNTER — Encounter (HOSPITAL_COMMUNITY): Payer: Self-pay

## 2024-06-28 DIAGNOSIS — I214 Non-ST elevation (NSTEMI) myocardial infarction: Secondary | ICD-10-CM

## 2024-06-28 NOTE — Progress Notes (Signed)
 Cardiac Individual Treatment Plan  Patient Details  Name: Bonnie Martin MRN: [id number] Date of Birth: [birth year] Referring Provider:   Flowsheet Row CARDIAC REHAB PHASE II ORIENTATION from 10/21/2023 in [facility] CARDIAC REHABILITATION  Referring Provider Kate Bruckner MD    Initial Encounter Date:  Flowsheet Row CARDIAC REHAB PHASE II ORIENTATION from 10/21/2023 in [place] CARDIAC REHABILITATION  Date 10/21/23    Visit Diagnosis: NSTEMI (non-ST elevated myocardial infarction) [facility])  Patient's Home Medications on Admission:  Current Outpatient Medications:    Acetaminophen  Extra Strength 500 MG TABS, Take 500-1,000 mg by mouth 3 (three) times daily as needed (Pain)., Disp: , Rfl:    allopurinol  (ZYLOPRIM ) 300 MG tablet, Take 300 mg by mouth every evening., Disp: , Rfl:    amLODipine  (NORVASC ) 5 MG tablet, Take 1 tablet (5 mg total) by mouth daily., Disp: 90 tablet, Rfl: 1   aspirin  EC 81 MG tablet, Take 1 tablet (81 mg total) by mouth daily. Swallow whole., Disp: , Rfl:    clobetasol ointment (TEMOVATE) 0.05 %, daily., Disp: , Rfl:    Clobetasol Propionate 0.05 % shampoo, Apply topically every 14 (fourteen) days., Disp: , Rfl:    colchicine 0.6 MG tablet, Take 0.6 mg by mouth daily as needed (Gout)., Disp: , Rfl:    Evolocumab  (REPATHA  SURECLICK) 140 MG/ML SOAJ, Inject 140 mg into the skin every 14 (fourteen) days., Disp: 6 mL, Rfl: 2   finasteride (PROSCAR) 5 MG tablet, 2.5 mg daily., Disp: , Rfl:    fluticasone (FLONASE) 50 MCG/ACT nasal spray, Place 1 spray into both nostrils daily as needed for allergies or rhinitis., Disp: , Rfl:    furosemide  (LASIX ) 40 MG tablet, Take 1 tablet (40 mg total) by mouth daily. Can take an additional 20mg  as needed for increased swelling, Disp: , Rfl:    halobetasol (ULTRAVATE) 0.05 % cream, Apply 1 Application topically daily., Disp: , Rfl:    hydrocortisone (ANUSOL-HC) 2.5 % rectal cream, Place 1 Application rectally 2 (two) times  daily as needed for hemorrhoids or anal itching., Disp: , Rfl:    levothyroxine  (SYNTHROID ) 112 MCG tablet, Take by mouth., Disp: , Rfl:    metoprolol  (TOPROL -XL) 200 MG 24 hr tablet, Take 200 mg by mouth daily., Disp: , Rfl:    nitroGLYCERIN  (NITROSTAT ) 0.4 MG SL tablet, Place 1 tablet (0.4 mg total) under the tongue every 5 (five) minutes x 3 doses as needed for chest pain., Disp: 25 tablet, Rfl: 3   omeprazole  (PRILOSEC) 40 MG capsule, Take 40 mg by mouth daily as needed (Indigestion)., Disp: , Rfl:    valsartan (DIOVAN) 320 MG tablet, Take 320 mg by mouth daily., Disp: , Rfl:   Past Medical History: Past Medical History:  Diagnosis Date   Hypertension    Thyroid disease     Tobacco Use: Social History   Tobacco Use  Smoking Status Never  Smokeless Tobacco Never    Labs: Review Flowsheet       Latest Ref Rng & Units 09/22/2023  Labs for ITP Cardiac and Pulmonary Rehab  Cholestrol 0 - 200 mg/dL 795   LDL (calc) 0 - 99 mg/dL 882   HDL-C >59 mg/dL 65   Trlycerides <849 mg/dL 887   Hemoglobin J8r 4.8 - 5.6 % 5.7      Exercise Target Goals: Exercise Program Goal: Individual exercise prescription set using results from initial 6 min walk test and THRR while considering  patient's activity barriers and safety.   Exercise Prescription Goal:  Initial exercise prescription builds to 30-45 minutes a day of aerobic activity, 2-3 days per week.  Home exercise guidelines will be given to patient during program as part of exercise prescription that the participant will acknowledge.   Education: Aerobic Exercise: - Group verbal and visual presentation on the components of exercise prescription. Introduces F.I.T.T principle from ACSM for exercise prescriptions.  Reviews F.I.T.T. principles of aerobic exercise including progression. Written material provided at class time.   Education: Resistance Exercise: - Group verbal and visual presentation on the components of exercise  prescription. Introduces F.I.T.T principle from ACSM for exercise prescriptions  Reviews F.I.T.T. principles of resistance exercise including progression. Written material provided at class time.    Education: Exercise & Equipment Safety: - Individual verbal instruction and demonstration of equipment use and safety with use of the equipment.   Education: Exercise Physiology & General Exercise Guidelines: - Group verbal and written instruction with models to review the exercise physiology of the cardiovascular system and associated critical values. Provides general exercise guidelines with specific guidelines to those with heart or lung disease. Written material provided at class time.   Education: Flexibility, Balance, Mind/Body Relaxation: - Group verbal and visual presentation with interactive activity on the components of exercise prescription. Introduces F.I.T.T principle from ACSM for exercise prescriptions. Reviews F.I.T.T. principles of flexibility and balance exercise training including progression. Also discusses the mind body connection.  Reviews various relaxation techniques to help reduce and manage stress (i.e. Deep breathing, progressive muscle relaxation, and visualization). Balance handout provided to take home. Written material provided at class time.   Activity Barriers & Risk Stratification:   6 Minute Walk:   Oxygen Initial Assessment:   Oxygen Re-Evaluation:   Oxygen Discharge (Final Oxygen Re-Evaluation):   Initial Exercise Prescription:   Perform Capillary Blood Glucose checks as needed.  Exercise Prescription Changes:   Exercise Comments:   Exercise Goals and Review:   Exercise Goals Re-Evaluation :   Discharge Exercise Prescription (Final Exercise Prescription Changes):   Nutrition:  Target Goals: Understanding of nutrition guidelines, daily intake of sodium 1500mg , cholesterol 200mg , calories 30% from fat and 7% or less from saturated  fats, daily to have 5 or more servings of fruits and vegetables.  Education: Nutrition 1 -Group instruction provided by verbal, written material, interactive activities, discussions, models, and posters to present general guidelines for heart healthy nutrition including macronutrients, label reading, and promoting whole foods over processed counterparts. Education serves as Pensions consultant of discussion of heart healthy eating for all. Written material provided at class time.    Education: Nutrition 2 -Group instruction provided by verbal, written material, interactive activities, discussions, models, and posters to present general guidelines for heart healthy nutrition including sodium, cholesterol, and saturated fat. Providing guidance of habit forming to improve blood pressure, cholesterol, and body weight. Written material provided at class time.     Biometrics:    Nutrition Therapy Plan and Nutrition Goals:   Nutrition Assessments:  MEDIFICTS Score Key: >=70 Need to make dietary changes  40-70 Heart Healthy Diet <= 40 Therapeutic Level Cholesterol Diet  Flowsheet Row CARDIAC REHAB PHASE II ORIENTATION from 10/21/2023 in [facility] CARDIAC REHABILITATION  Picture Your Plate Total Score on Admission 62   Picture Your Plate Scores: <59 Unhealthy dietary pattern with much room for improvement. 41-50 Dietary pattern unlikely to meet recommendations for good health and room for improvement. 51-60 More healthful dietary pattern, with some room for improvement.  >60 Healthy dietary pattern, although there may be some specific  behaviors that could be improved.    Nutrition Goals Re-Evaluation:   Nutrition Goals Discharge (Final Nutrition Goals Re-Evaluation):   Psychosocial: Target Goals: Acknowledge presence or absence of significant depression and/or stress, maximize coping skills, provide positive support system. Participant is able to verbalize types and ability to use techniques  and skills needed for reducing stress and depression.   Education: Stress, Anxiety, and Depression - Group verbal and visual presentation to define topics covered.  Reviews how body is impacted by stress, anxiety, and depression.  Also discusses healthy ways to reduce stress and to treat/manage anxiety and depression. Written material provided at class time.   Education: Sleep Hygiene -Provides group verbal and written instruction about how sleep can affect your health.  Define sleep hygiene, discuss sleep cycles and impact of sleep habits. Review good sleep hygiene tips.   Initial Review & Psychosocial Screening:   Quality of Life Scores:   Scores of 19 and below usually indicate a poorer quality of life in these areas.  A difference of  2-3 points is a clinically meaningful difference.  A difference of 2-3 points in the total score of the Quality of Life Index has been associated with significant improvement in overall quality of life, self-image, physical symptoms, and general health in studies assessing change in quality of life.  PHQ-9: Review Flowsheet       10/21/2023 11/07/2016 11/03/2016  Depression screen PHQ 2/9  Decreased Interest 0 0 0  Down, Depressed, Hopeless 0 0 0  PHQ - 2 Score 0 0 0  Altered sleeping 0 - -  Tired, decreased energy 1 - -  Change in appetite 1 - -  Feeling bad or failure about yourself  0 - -  Trouble concentrating 0 - -  Moving slowly or fidgety/restless 0 - -  Suicidal thoughts 0 - -  PHQ-9 Score 2 - -  Difficult doing work/chores Not difficult at all - -   Interpretation of Total Score  Total Score Depression Severity:  1-4 = Minimal depression, 5-9 = Mild depression, 10-14 = Moderate depression, 15-19 = Moderately severe depression, 20-27 = Severe depression   Psychosocial Evaluation and Intervention:   Psychosocial Re-Evaluation:   Psychosocial Discharge (Final Psychosocial Re-Evaluation):   Vocational Rehabilitation: Provide  vocational rehab assistance to qualifying candidates.   Vocational Rehab Evaluation & Intervention:   Education: Education Goals: Education classes will be provided on a variety of topics geared toward better understanding of heart health and risk factor modification. Participant will state understanding/return demonstration of topics presented as noted by education test scores.  Learning Barriers/Preferences:   General Cardiac Education Topics:  AED/CPR: - Group verbal and written instruction with the use of models to demonstrate the basic use of the AED with the basic ABC's of resuscitation.   Test and Procedures: - Group verbal and visual presentation and models provide information about basic cardiac anatomy and function. Reviews the testing methods done to diagnose heart disease and the outcomes of the test results. Describes the treatment choices: Medical Management, Angioplasty, or Coronary Bypass Surgery for treating various heart conditions including Myocardial Infarction, Angina, Valve Disease, and Cardiac Arrhythmias. Written material provided at class time.   Medication Safety: - Group verbal and visual instruction to review commonly prescribed medications for heart and lung disease. Reviews the medication, class of the drug, and side effects. Includes the steps to properly store meds and maintain the prescription regimen. Written material provided at class time.   Intimacy: - Group verbal  instruction through game format to discuss how heart and lung disease can affect sexual intimacy. Written material provided at class time.   Know Your Numbers and Heart Failure: - Group verbal and visual instruction to discuss disease risk factors for cardiac and pulmonary disease and treatment options.  Reviews associated critical values for Overweight/Obesity, Hypertension, Cholesterol, and Diabetes.  Discusses basics of heart failure: signs/symptoms and treatments.  Introduces Heart  Failure Zone chart for action plan for heart failure. Written material provided at class time.   Infection Prevention: - Provides verbal and written material to individual with discussion of infection control including proper hand washing and proper equipment cleaning during exercise session.   Falls Prevention: - Provides verbal and written material to individual with discussion of falls prevention and safety.   Other: -Provides group and verbal instruction on various topics (see comments)   Knowledge Questionnaire Score:   Core Components/Risk Factors/Patient Goals at Admission:   Education:Diabetes - Individual verbal and written instruction to review signs/symptoms of diabetes, desired ranges of glucose level fasting, after meals and with exercise. Acknowledge that pre and post exercise glucose checks will be done for 3 sessions at entry of program.   Core Components/Risk Factors/Patient Goals Review:    Core Components/Risk Factors/Patient Goals at Discharge (Final Review):    ITP Comments:  ITP Comments     Row Name 05/25/24 1541 06/22/24 1822         ITP Comments 30 day review completed. ITP sent to Dr. Dorn Ross, Medical Director of Cardiac Rehab. Continue with ITP unless changes are made by physician.  Attendance continues to be spotty.  Last attended on 04/26/24.  36 weeks will be up on 06/24/24 End of 36 weeks is on Friday and she has yet to return to program.  We will discharge at this time.         Comments: Discharge ITP

## 2024-06-28 NOTE — Progress Notes (Signed)
 Discharge Progress Report  Patient Details  Name: Bonnie Martin MRN: [id number] Date of Birth: [birth year] Referring Provider:   Flowsheet Row CARDIAC REHAB PHASE II ORIENTATION from 10/21/2023 in [facility] CARDIAC REHABILITATION  Referring Provider Kate Bruckner MD     Number of Visits: 8  Reason for Discharge:  Early Exit:  Lack of attendance  Smoking History:  Social History   Tobacco Use  Smoking Status Never  Smokeless Tobacco Never    Diagnosis:  NSTEMI (non-ST elevated myocardial infarction) [facility])  ADL UCSD:   Initial Exercise Prescription:   Discharge Exercise Prescription (Final Exercise Prescription Changes):   Functional Capacity:   Psychological, QOL, Others - Outcomes: PHQ 2/9:    10/21/2023    3:19 PM 11/07/2016    4:33 PM 11/03/2016    2:04 PM  Depression screen PHQ 2/9  Decreased Interest 0 0 0  Down, Depressed, Hopeless 0 0 0  PHQ - 2 Score 0 0 0  Altered sleeping 0    Tired, decreased energy 1    Change in appetite 1    Feeling bad or failure about yourself  0    Trouble concentrating 0    Moving slowly or fidgety/restless 0    Suicidal thoughts 0    PHQ-9 Score 2    Difficult doing work/chores Not difficult at all      Quality of Life:   Personal Goals: Goals established at orientation with interventions provided to work toward goal.    Personal Goals Discharge:   Exercise Goals and Review:   Exercise Goals Re-Evaluation:   Nutrition & Weight - Outcomes:    Nutrition:   Nutrition Discharge:   Education Questionnaire Score:   Goals reviewed with patient; copy given to patient.

## 2024-07-19 NOTE — Progress Notes (Signed)
  Cardiology Office Note:  .   Date:  08/01/2024  ID:  Bonnie Martin, DOB [birth year], MRN [id number] PCP: Bonnie Boyer, FNP  [facility] Providers Cardiologist:  Bonnie DELENA Leavens, MD    History of Present Illness: Bonnie   Laylynn Campanella Martin is a 63 y.o. female  with a PMH of CAD NSTEMI  s/p Takotsubo cardiomyopathy 2002, GERD, HTN, hypothyroid, HTN, HLD, CKD stage IIIa.  Patient was seen 04/2024 for LE edema-on feet all day as at work and taking extra lasix  some days. Getting extra salt in diet with frozen dinners and sodas. Wasn't consistent with repatha  due to family stressors.  Patient comes in for f/u. BP up today. She hasn't taken her meds yet today and doesn't take them at the same time daily. She's cut back on frozen dinners but still drinking 2 sodas a day. Drinking 3-4 16 ounce bottles water daily. Swelling has improved some. Trying to elevate feet more.   ROS:    Studies Reviewed: Bonnie         Prior CV Studies:   MR cardiac 09/2023 IMPRESSION: 1.  Imaging criteria negative for myocarditis.   2.  Normal ventricular function.   Bonnie Leavens MD     Electronically Signed   By: Bonnie Martin M.D.   On: 09/23/2023 15:20  Cath 09/2023 No angiographic evidence of CAD LVEDP=5 mmHg   Recommendations: No further ischemic workup    Risk Assessment/Calculations:             Physical Exam:   VS:  BP 130/80   Pulse 68   Ht 5' 1 (1.549 m)   Wt 285 lb (129.3 kg)   SpO2 96%   BMI 53.85 kg/m    Orhtostatics: No data found. Wt Readings from Last 3 Encounters:  08/01/24 285 lb (129.3 kg)  05/04/24 256 lb 6.4 oz (116.3 kg)  01/21/24 286 lb (129.7 kg)    GEN: Obese,  in no acute distress NECK: No JVD; No carotid bruits CARDIAC:  RRR, no murmurs, rubs, gallops RESPIRATORY:  Clear to auscultation without rales, wheezing or rhonchi  ABDOMEN: Soft, non-tender, non-distended EXTREMITIES:  chronic edema; No deformity   ASSESSMENT AND  PLAN: .    CAD NSTEMI with Takotsubo CM 2002, no CAD on cath 09/2023  HTN -BP up initially but came down.  -needs to take meds consistent times daily in am  HLD LDL 117 10/2023 on repatha   CKD stage 3  -Crt 1.67 when taking extra lasix . Recheck today.  LE edema-with chronic venous insufficiency -has improved some with reducing sodium in diet -weight loss and exercise recommended  Suspected sleep apnea-home sleep study ordered, waitng to be scheduled  Obesity -couldn't afford ozempic in past, will see if cost has come down through our pharmacy team.        Dispo: f/u Dr. JAYSON 6 months  Signed, Bonnie Pavy, PA-C

## 2024-08-01 ENCOUNTER — Ambulatory Visit: Attending: Physician Assistant | Admitting: Physician Assistant

## 2024-08-01 ENCOUNTER — Encounter: Payer: Self-pay | Admitting: Physician Assistant

## 2024-08-01 VITALS — BP 130/80 | HR 68 | Ht 61.0 in | Wt 285.0 lb

## 2024-08-01 DIAGNOSIS — I10 Essential (primary) hypertension: Secondary | ICD-10-CM | POA: Diagnosis not present

## 2024-08-01 DIAGNOSIS — E785 Hyperlipidemia, unspecified: Secondary | ICD-10-CM | POA: Diagnosis not present

## 2024-08-01 DIAGNOSIS — N183 Chronic kidney disease, stage 3 unspecified: Secondary | ICD-10-CM | POA: Diagnosis not present

## 2024-08-01 DIAGNOSIS — R29818 Other symptoms and signs involving the nervous system: Secondary | ICD-10-CM

## 2024-08-01 DIAGNOSIS — I5181 Takotsubo syndrome: Secondary | ICD-10-CM

## 2024-08-01 NOTE — Patient Instructions (Addendum)
 Medication Instructions:    Your physician recommends that you continue on your current medications as directed. Please refer to the Current Medication list given to you today.   *If you need a refill on your cardiac medications before your next appointment, please call your pharmacy*  Lab Work:  PLEASE GO DOWN STAIRS  [facility]  FIRST FLOOR   ( GET OFF ELEVATORS WALK TOWARDS WAITING AREA LAB LOCATED BY PHARMACY):  BMET TODAY       If you have labs (blood work) drawn today and your tests are completely normal, you will receive your results only by: MyChart Message (if you have MyChart) OR A paper copy in the mail If you have any lab test that is abnormal or we need to change your treatment, we will call you to review the results.  Testing/Procedures: NONE ORDERED  TODAY    Follow-Up: At [facility], you and your health needs are our priority.  As part of our continuing mission to provide you with exceptional heart care, our providers are all part of one team.  This team includes your primary Cardiologist (physician) and Advanced Practice Providers or APPs (Physician Assistants and Nurse Practitioners) who all work together to provide you with the care you need, when you need it.  Your next appointment:   6 month(s)  Provider:   Stanly DELENA Leavens, MD    We recommend signing up for the patient portal called MyChart.  Sign up information is provided on this After Visit Summary.  MyChart is used to connect with patients for Virtual Visits (Telemedicine).  Patients are able to view lab/test results, encounter notes, upcoming appointments, etc.  Non-urgent messages can be sent to your provider as well.   To learn more about what you can do with MyChart, go to [email or web address].

## 2024-08-02 ENCOUNTER — Ambulatory Visit: Payer: Self-pay | Admitting: Physician Assistant

## 2024-08-02 ENCOUNTER — Other Ambulatory Visit (HOSPITAL_COMMUNITY): Payer: Self-pay

## 2024-08-02 LAB — BASIC METABOLIC PANEL WITH GFR
BUN/Creatinine Ratio: 10 — ABNORMAL LOW (ref 12–28)
BUN: 15 mg/dL (ref 8–27)
CO2: 22 mmol/L (ref 20–29)
Calcium: 10.9 mg/dL — ABNORMAL HIGH (ref 8.7–10.3)
Chloride: 106 mmol/L (ref 96–106)
Creatinine, Ser: 1.45 mg/dL — ABNORMAL HIGH (ref 0.57–1.00)
Glucose: 90 mg/dL (ref 70–99)
Potassium: 4.4 mmol/L (ref 3.5–5.2)
Sodium: 141 mmol/L (ref 134–144)
eGFR: 41 mL/min/1.73 — ABNORMAL LOW (ref >59)
